# Patient Record
Sex: Female | Born: 1975 | Race: White | Hispanic: No | Smoking: Current every day smoker
Health system: Southern US, Community
[De-identification: ages and names within clinical notes are randomized; demographics above are authoritative.]

## PROBLEM LIST (undated history)

## (undated) DIAGNOSIS — E119 Type 2 diabetes mellitus without complications: Secondary | ICD-10-CM

## (undated) DIAGNOSIS — I10 Essential (primary) hypertension: Secondary | ICD-10-CM

## (undated) DIAGNOSIS — E785 Hyperlipidemia, unspecified: Secondary | ICD-10-CM

## (undated) DIAGNOSIS — T7840XA Allergy, unspecified, initial encounter: Secondary | ICD-10-CM

## (undated) DIAGNOSIS — G629 Polyneuropathy, unspecified: Secondary | ICD-10-CM

## (undated) HISTORY — DX: Essential (primary) hypertension: I10

## (undated) HISTORY — DX: Type 2 diabetes mellitus without complications: E11.9

## (undated) HISTORY — DX: Hyperlipidemia, unspecified: E78.5

## (undated) HISTORY — PX: ABDOMINAL HYSTERECTOMY: SHX81

## (undated) HISTORY — DX: Allergy, unspecified, initial encounter: T78.40XA

## (undated) HISTORY — PX: OTHER SURGICAL HISTORY: SHX169

## (undated) HISTORY — PX: DIAGNOSTIC LAPAROSCOPY: SUR761

---

## 2014-03-17 ENCOUNTER — Ambulatory Visit: Payer: Self-pay | Admitting: Obstetrics and Gynecology

## 2014-03-17 LAB — HEMOGLOBIN: HGB: 10.6 g/dL — ABNORMAL LOW (ref 12.0–16.0)

## 2014-03-17 LAB — BASIC METABOLIC PANEL
Anion Gap: 6 — ABNORMAL LOW (ref 7–16)
BUN: 8 mg/dL (ref 7–18)
CHLORIDE: 99 mmol/L (ref 98–107)
CREATININE: 0.67 mg/dL (ref 0.60–1.30)
Calcium, Total: 8.9 mg/dL (ref 8.5–10.1)
Co2: 29 mmol/L (ref 21–32)
EGFR (African American): >60
Glucose: 112 mg/dL — ABNORMAL HIGH (ref 65–99)
OSMOLALITY: 267 (ref 275–301)
Potassium: 4 mmol/L (ref 3.5–5.1)
Sodium: 134 mmol/L — ABNORMAL LOW (ref 136–145)

## 2014-03-23 ENCOUNTER — Ambulatory Visit: Payer: Self-pay | Admitting: Obstetrics and Gynecology

## 2014-03-25 LAB — PATHOLOGY REPORT

## 2014-04-22 ENCOUNTER — Ambulatory Visit: Payer: Self-pay | Admitting: Obstetrics and Gynecology

## 2014-04-22 LAB — BASIC METABOLIC PANEL
Anion Gap: 3 — ABNORMAL LOW (ref 7–16)
BUN: 6 mg/dL — ABNORMAL LOW (ref 7–18)
CALCIUM: 8.6 mg/dL (ref 8.5–10.1)
Chloride: 105 mmol/L (ref 98–107)
Co2: 30 mmol/L (ref 21–32)
Creatinine: 0.69 mg/dL (ref 0.60–1.30)
EGFR (African American): >60
EGFR (Non-African Amer.): >60
GLUCOSE: 134 mg/dL — AB (ref 65–99)
Osmolality: 275 (ref 275–301)
Potassium: 3.7 mmol/L (ref 3.5–5.1)
SODIUM: 138 mmol/L (ref 136–145)

## 2014-04-22 LAB — HEMOGLOBIN A1C: Hemoglobin A1C: 7.7 % — ABNORMAL HIGH (ref 4.2–6.3)

## 2014-04-22 LAB — HEMOGLOBIN: HGB: 10.9 g/dL — AB (ref 12.0–16.0)

## 2014-04-27 ENCOUNTER — Inpatient Hospital Stay: Payer: Self-pay | Admitting: Obstetrics and Gynecology

## 2014-04-28 LAB — CREATININE, SERUM
CREATININE: 0.84 mg/dL (ref 0.60–1.30)
EGFR (Non-African Amer.): >60

## 2014-04-28 LAB — HEMOGLOBIN: HGB: 8.9 g/dL — ABNORMAL LOW (ref 12.0–16.0)

## 2014-04-30 LAB — PATHOLOGY REPORT

## 2014-11-20 HISTORY — PX: ABDOMINAL HYSTERECTOMY: SHX81

## 2014-12-21 LAB — HM DIABETES EYE EXAM

## 2015-01-19 DEATH — deceased

## 2015-03-13 NOTE — Op Note (Signed)
PATIENT NAME:  Glendon AxeJOHNSON, Carol L MR#:  [id number] DATE OF BIRTH:  Nov 02, 1976  DATE OF PROCEDURE:  03/23/2014  PREOPERATIVE DIAGNOSES:  1. Menorrhagia.  2. Anemia.  POSTOPERATIVE DIAGNOSES:  1. Menorrhagia.  2. Anemia.  OPERATIONS: Hysteroscopy, Dilation and Curettage.  ANESTHESIA: General.   SURGEON: Dr. Hildred LaserAnika Jaedan Huttner.   ASSISTANT: Martina SinnerIsaac Stappas, [place] student.   ESTIMATED BLOOD LOSS: 25 mL.  OPERATIVE FLUIDS: 800 mL.   URINE OUTPUT: 20 mL.   COMPLICATIONS: None.   FINDINGS: Proliferative endometrium, anteverted uterine cavity.   SPECIMEN: Endometrial curettings.   PROCEDURE: The patient was taken to the operating room where she was placed under general anesthesia without difficulty. She was then prepped and draped in the normal sterile fashion. A straight catheterization was performed.  Next, a univalve speculum was then placed inside the patient's vagina. The cervix was then identified and grasped using the single-tooth tenaculum at the anterior portion of the cervix. After this, the uterine was then sounded to approximately 9 cm and dilated appropriately next, a 5-mm hysteroscope was then introduced into the uterine cavity using normal saline as the distending medium. The endometrial cavity was visualized. Both tubal ostia were identified. The lining was noted to be proliferative. No endometrial masses were observed. No perforations were noted. The hysteroscope was then removed and a sharp curettage was performed until a gritty texture was noted. Next, the NovaSure device was then inserted into the uterine cavity. Attempts to activate the NovaSure device were thwarted due to technical difficulties. The cavity assessment could not be completed although the cavity width  (3.5 cm) and length (4.5 cm) had been determined. There was good seal noted at the device; however, several attempts were made using 2 different devices and the cavity assessment could not be completed. Next, the NovaSure  device was then removed from the uterine cavity. The single-tooth tenaculum was removed from the anterior lip of the cervix. Good hemostasis as noted. The speculum was removed from the patient's vagina. The patient was taken to the recovery room in stable condition.    ____________________________ Jacques EarthlyAnika S. Valentino Saxonherry, MD asc:lt D: 03/23/2014 14:32:59 ET T: 03/24/2014 00:52:43 ET JOB#: [id number]  cc: Jacques EarthlyAnika S. Valentino Saxonherry, MD, <Dictator> Fabian NovemberANIKA S Brigg Cape MD ELECTRONICALLY SIGNED 03/25/2014 16:45

## 2015-03-13 NOTE — Op Note (Signed)
PATIENT NAME:  Carol Collins, Carol Collins MR#:  [id number] DATE OF BIRTH:  10-27-1976  DATE OF PROCEDURE:  04/27/2014  PREOPERATIVE DIAGNOSES:  1.  Menorrhagia.  2.  Severe dysmenorrhea.  3.  Dyspareunia.  4.  Hypertension. 5.  Diabetes mellitus.  6.  Obesity.  7.  Anemia.  8. History of previous C-section x 2   POSTOPERATIVE DIAGNOSES:  1.  Menorrhagia.  2.  Severe dysmenorrhea.  3.  Dyspareunia.  4.  Hypertension. 5.  Diabetes mellitus.  6.  Obesity.  7.  Anemia.  8.  History of previous C-section x 2  9.  Pelvic adhesions.    OPERATIONS: Include operative laparoscopy with lysis of adhesions and bilateral salpingectomy and total abdominal hysterectomy.   ANESTHESIA: General.   SURGEON: Hildred Laser, MD   ASSISTANT: Sharon Seller, M.D.; Alfredo Martinez, [place] student.   ESTIMATED BLOOD LOSS: 500 mL   OPERATIVE FLUIDS: 2200 mL.   URINE OUTPUT: 75 mL.   COMPLICATIONS: None.   FINDINGS: Anterior abdominal wall adhesions of the omentum, dense adhesions of lower uterine segment of the bladder. Endometrial implant was noted at the lower uterine segment anteriorly. Bilateral simple ovarian cysts, approximately 3 x 2 cm each. There was a bulky, enlarged uterus, approximately 12 weeks size.   SPECIMEN: Uterus and bilateral fallopian tubes.   PROCEDURE: The patient was taken to the Operating Room, where she was placed in the dorsal lithotomy position. She was then prepped and draped in normal sterile fashion. Next, a Foley catheter was then placed. A speculum was then placed into the vagina. A single-tooth tenaculum was utilized to grasp the anterior lip of the uterine cervix. Uterus was sounded to approximately 8 cm. A Hulka clamp was then inserted for uterine manipulation. The single-tooth tenaculum and speculum were then removed from the patient's vagina.   At this time, attention was turned to the abdomen, where an infraumbilical skin incision was made vertically. Next, a 5 mm trocar and  sheath with the laparoscope were inserted into the abdominal cavity. This was done under direct visualization. After entry into the abdominal cavity was confirmed, the abdomen was then insufflated with CO2 gas. Next, the abdomen was evaluated and noted to have abdominal adhesions of the omentum to the anterior abdominal wall. All of the findings were as noted above. Two 5 mm port sites were then created under direct visualization just lateral to the rectus abdominus muscles with great care not to injure any vessels. Next, the grasper was used to manipulate the bowel away from the operative site.   At this time, the right cornu was grasped and the right fallopian tube was transected and ligated from the right ovary using the Harmonic device.  The utero-ovarian ligmaent was then transected and ligated using the Harmonic device.  A similar procedure was then carried out on the left for the fallopian tube and ovary.  Next, the round ligaments on each sider were transected and ligated. The ureters were noted to be deep in the pelvis.  The remainder of the uterine vessels and anterior and posterior leaves of the broad ligament were coagulated and transected in a serial fashion.  The anterior leaf of the broad ligament was then attempted to be dissected however dense adhesions were noted of the bladder to the lower uterine segment. The decision was made to complete the procedure vaginally. The laparoscope was then removed from the abdomen  Attention was then turned to the vagina where a weighted speculum was placed.  A  Deaver was used for retraction of the anterior vaginal wall.  A double-tooth tenaculum was placed on the cervix and manipulation was attempted.  The uterus was noted to still have no descensus.  Due to lack of descensus likely caused by dense bladder adhesions, the decision was made to convert to laparotomy.   Attention was then redirected to the patient's abdomen.  The abdomen was deflated and the  trocars were removed.  Next a infraumbilical midline incision was made going through the patient's prior scar with the scalpel.  Then inicision was carried down to the level of the fascia using the Bovie.  The fascia was incised and the incision was extended longitudinally.  The rectus muscle on the right was dissected off the fascia.  The peritoneum was identified and entered and the incision was extended longitudinally.    The above findings were again observed.  A Balfour retractor was placed and the bowel was packed away from the surgical site.   The uterine arteries were then clamped, cut, and suture-ligated with sutures of 0 Vicryl bilaterally. The anterior peritoneal reflection was then carefully dissected off the lower uterine segment with lysis of adhesions performed.  After this, serial pedicles of the cardinal and utero-sacral ligaments were clamped, cut, and suture ligated with 0 Vicryl.  The uterus was amputated at the level of the internal cervical os to allow for better visualization.  The remaining pedicles of the utero-sacral ligaments were clamped, cut, and suture ligated with 0 Vicryl.  Entrance was made into the vagina and the cervical stump was removed.  Vaginal cuff angle sutures were placed incorporating the utero-scaral ligmanets for support.  The vaginal cuff was then closed with figure-of-eight sutures of 0 Vicryl.  Lavage was carried out until clear.  Hemostasis was observed.   Attention was turned once more to pedicles of the round and utero-ovarian ligaments, where hemostasis was observed. The retractor and all packing was removed from the abdomen.  The fascia was approximated using a running suture of 0 Maxon.  Lavage was again carried out.  The subcutaneous fat layer was approximated using 2-0 Vicryl.  The skin was approximated with staples. The laparoscopic port sites were closed using Dermabond.  A lidoderm patch was placed over the midline incision.   Instrument, sponge, and  needle counts were correct prior to abdominal closure and at the conclusion of the case.  The patient was awakened from anesthesia and taken to the recovery room in stable condition.   ____________________________ Jacques EarthlyAnika S. Valentino Saxonherry, MD asc:cg D: 04/27/2014 19:13:00 ET T: 04/28/2014 02:36:12 ET JOB#: [id number]  cc: Jacques EarthlyAnika S. Valentino Saxonherry, MD, <Dictator> Fabian NovemberANIKA S Yida Hyams MD ELECTRONICALLY SIGNED 05/04/2014 15:08

## 2015-05-13 ENCOUNTER — Ambulatory Visit: Payer: Self-pay

## 2015-05-20 ENCOUNTER — Other Ambulatory Visit: Payer: Self-pay

## 2015-05-20 LAB — BASIC METABOLIC PANEL
CREATININE: 0.7 mg/dL (ref 0.5–1.1)
GLUCOSE: 92 mg/dL

## 2015-05-20 LAB — LIPID PANEL
Cholesterol: 185 mg/dL (ref 0–200)
HDL: 44 mg/dL (ref 35–70)
LDL CALC: 95 mg/dL

## 2015-05-20 LAB — HEPATIC FUNCTION PANEL
Bilirubin, Direct: 0.17 mg/dL (ref 0.01–0.4)
Bilirubin, Total: 0.6 mg/dL

## 2015-05-20 LAB — CBC AND DIFFERENTIAL
HCT: 36 % (ref 36–46)
Hemoglobin: 12.2 g/dL (ref 12.0–16.0)
Neutrophils Absolute: 7 /uL
Platelets: 206 10*3/uL (ref 150–399)
WBC: 10.9 10*3/mL

## 2015-05-20 LAB — TSH: TSH: 1.22 u[IU]/mL (ref 0.41–5.90)

## 2015-05-21 LAB — HEMOGLOBIN A1C: HEMOGLOBIN A1C: 6.9 % — AB (ref 4.0–6.0)

## 2015-06-08 ENCOUNTER — Ambulatory Visit: Payer: Self-pay

## 2015-06-09 ENCOUNTER — Encounter (INDEPENDENT_AMBULATORY_CARE_PROVIDER_SITE_OTHER): Payer: Self-pay

## 2015-06-09 ENCOUNTER — Ambulatory Visit: Payer: Self-pay

## 2015-06-10 ENCOUNTER — Ambulatory Visit: Payer: Self-pay

## 2015-06-25 ENCOUNTER — Ambulatory Visit (INDEPENDENT_AMBULATORY_CARE_PROVIDER_SITE_OTHER): Payer: Self-pay | Admitting: Family Medicine

## 2015-06-25 ENCOUNTER — Encounter: Payer: Self-pay | Admitting: Family Medicine

## 2015-06-25 ENCOUNTER — Ambulatory Visit
Admission: RE | Admit: 2015-06-25 | Discharge: 2015-06-25 | Disposition: A | Discharge status: Home / Self Care | Payer: Self-pay | Source: Ambulatory Visit | Attending: Nurse Practitioner | Admitting: Nurse Practitioner

## 2015-06-25 VITALS — BP 113/78 | HR 88 | Temp 98.2°F | Resp 16 | Ht 60.0 in | Wt 223.8 lb

## 2015-06-25 DIAGNOSIS — R42 Dizziness and giddiness: Secondary | ICD-10-CM | POA: Insufficient documentation

## 2015-06-25 DIAGNOSIS — R110 Nausea: Secondary | ICD-10-CM | POA: Insufficient documentation

## 2015-06-25 DIAGNOSIS — I10 Essential (primary) hypertension: Secondary | ICD-10-CM | POA: Insufficient documentation

## 2015-06-25 DIAGNOSIS — F17200 Nicotine dependence, unspecified, uncomplicated: Secondary | ICD-10-CM | POA: Insufficient documentation

## 2015-06-25 DIAGNOSIS — E119 Type 2 diabetes mellitus without complications: Secondary | ICD-10-CM | POA: Insufficient documentation

## 2015-06-25 DIAGNOSIS — E669 Obesity, unspecified: Secondary | ICD-10-CM | POA: Insufficient documentation

## 2015-06-25 NOTE — Patient Instructions (Signed)
Your symptoms could be vasovagal in nature.  They could also be related to hydration status as the time your feel faint. Please continue your work-up with the [facility].

## 2015-06-25 NOTE — Progress Notes (Signed)
Subjective:    Patient ID: Carol Collins, female    DOB: Jun 08, 1976, 39 y.o.   MRN: [id number]  HPI: Carol Collins is a 39 y.o. female presenting on 06/25/2015 for Employment Physical   HPI  Pt presents for FMLA paperwork for her job. She is currently being [facility] for dizziness, nausea, and sweating. She has a history of DM but sugars have been normal. She is having to leave work early 1-2 times per month. Episodes occur 1-2 per month. Episodes last 1-2 hours- she feels well by the evening. Episodes do not include chest pain, palpitations, no shortness of breath. She will have an EKG to work up her symptoms. Symptoms been occurring for about 1 year. She has been treated for the past 2 mos.               Past Medical History  Diagnosis Date  . Hypertension   . Diabetes mellitus without complication     No current outpatient prescriptions on file prior to visit.   No current facility-administered medications on file prior to visit.    Review of Systems  Constitutional: Positive for diaphoresis (when episodes occur).  HENT: Negative.   Respiratory: Negative for chest tightness, shortness of breath and wheezing.   Cardiovascular: Negative for chest pain, palpitations and leg swelling.  Gastrointestinal: Positive for nausea (when episodes occur).  Endocrine: Negative for polydipsia, polyphagia and polyuria.  Genitourinary: Negative.   Musculoskeletal: Negative.   Skin: Negative for color change, pallor and rash.  Neurological: Positive for light-headedness (when episodes occur).  Psychiatric/Behavioral: Negative.    Per HPI unless specifically indicated above     Objective:    BP 113/78 mmHg  Pulse 88  Temp(Src) 98.2 F (36.8 C) (Oral)  Resp 16  Ht 5' (1.524 m)  Wt 223 lb 12.8 oz (101.515 kg)  BMI 43.71 kg/m2  LMP   Wt Readings from Last 3 Encounters:  06/25/15 223 lb 12.8 oz (101.515 kg)    Physical Exam  Constitutional: She is oriented to  person, place, and time. She appears well-developed and well-nourished. No distress.  Neck: Normal range of motion. Neck supple. No thyromegaly present.  Cardiovascular: Normal rate and regular rhythm.  Exam reveals no gallop and no friction rub.   No murmur heard. Pulmonary/Chest: Effort normal and breath sounds normal. She has no wheezes. She exhibits no tenderness.  Neurological: She is alert and oriented to person, place, and time. She has normal reflexes.  Skin: Skin is warm and dry. She is not diaphoretic. No erythema. No pallor.                        Assessment & Plan:   Problem List Items Addressed This Visit    None    Visit Diagnoses    Episodic lightheadedness    -  Primary    FMLA paper work completed. Pt will continue her follow-up [facility] for [facility]. EKG today. Possible vasovagal response.        Meds ordered this encounter  Medications  . hydrochlorothiazide (HYDRODIURIL) 25 MG tablet    Sig: Take 25 mg by mouth daily.  Marland Kitchen lisinopril (PRINIVIL,ZESTRIL) 10 MG tablet    Sig: Take 10 mg by mouth daily.  . metFORMIN (GLUMETZA) 1000 MG (MOD) 24 hr tablet    Sig: Take 1,000 mg by mouth 2 (two) times daily.  . fluconazole (DIFLUCAN) 10 MG/ML  suspension    Sig: Take by mouth daily.  . fluticasone (FLONASE) 50 MCG/ACT nasal spray    Sig: Place into both nostrils daily.      Follow up plan: Return if symptoms worsen or fail to improve.

## 2015-09-16 ENCOUNTER — Other Ambulatory Visit: Payer: Self-pay

## 2015-09-21 ENCOUNTER — Other Ambulatory Visit: Payer: Self-pay

## 2015-09-23 ENCOUNTER — Ambulatory Visit: Payer: Self-pay

## 2015-09-28 ENCOUNTER — Ambulatory Visit: Payer: Self-pay

## 2015-10-08 ENCOUNTER — Encounter: Payer: Self-pay | Admitting: Family Medicine

## 2015-10-08 ENCOUNTER — Ambulatory Visit (INDEPENDENT_AMBULATORY_CARE_PROVIDER_SITE_OTHER): Payer: Medicaid Other | Admitting: Family Medicine

## 2015-10-08 VITALS — BP 108/72 | HR 90 | Temp 98.0°F | Resp 16 | Ht 61.0 in | Wt 230.2 lb

## 2015-10-08 DIAGNOSIS — F17200 Nicotine dependence, unspecified, uncomplicated: Secondary | ICD-10-CM

## 2015-10-08 DIAGNOSIS — Z23 Encounter for immunization: Secondary | ICD-10-CM | POA: Diagnosis not present

## 2015-10-08 DIAGNOSIS — B373 Candidiasis of vulva and vagina: Secondary | ICD-10-CM | POA: Diagnosis not present

## 2015-10-08 DIAGNOSIS — J019 Acute sinusitis, unspecified: Secondary | ICD-10-CM | POA: Insufficient documentation

## 2015-10-08 DIAGNOSIS — M791 Myalgia, unspecified site: Secondary | ICD-10-CM | POA: Insufficient documentation

## 2015-10-08 DIAGNOSIS — D509 Iron deficiency anemia, unspecified: Secondary | ICD-10-CM | POA: Insufficient documentation

## 2015-10-08 DIAGNOSIS — Z720 Tobacco use: Secondary | ICD-10-CM | POA: Diagnosis not present

## 2015-10-08 DIAGNOSIS — I10 Essential (primary) hypertension: Secondary | ICD-10-CM

## 2015-10-08 DIAGNOSIS — E1142 Type 2 diabetes mellitus with diabetic polyneuropathy: Secondary | ICD-10-CM | POA: Insufficient documentation

## 2015-10-08 DIAGNOSIS — J0100 Acute maxillary sinusitis, unspecified: Secondary | ICD-10-CM | POA: Insufficient documentation

## 2015-10-08 DIAGNOSIS — R203 Hyperesthesia: Secondary | ICD-10-CM | POA: Insufficient documentation

## 2015-10-08 DIAGNOSIS — J302 Other seasonal allergic rhinitis: Secondary | ICD-10-CM | POA: Insufficient documentation

## 2015-10-08 DIAGNOSIS — J309 Allergic rhinitis, unspecified: Secondary | ICD-10-CM | POA: Insufficient documentation

## 2015-10-08 DIAGNOSIS — J301 Allergic rhinitis due to pollen: Secondary | ICD-10-CM

## 2015-10-08 DIAGNOSIS — B3731 Acute candidiasis of vulva and vagina: Secondary | ICD-10-CM

## 2015-10-08 DIAGNOSIS — N920 Excessive and frequent menstruation with regular cycle: Secondary | ICD-10-CM | POA: Insufficient documentation

## 2015-10-08 DIAGNOSIS — IMO0001 Reserved for inherently not codable concepts without codable children: Secondary | ICD-10-CM | POA: Insufficient documentation

## 2015-10-08 DIAGNOSIS — G114 Hereditary spastic paraplegia: Secondary | ICD-10-CM | POA: Insufficient documentation

## 2015-10-08 LAB — POCT GLYCOSYLATED HEMOGLOBIN (HGB A1C): HEMOGLOBIN A1C: 6.8

## 2015-10-08 LAB — MICROALBUMIN, URINE: Microalb, Ur: NEGATIVE

## 2015-10-08 MED ORDER — ALPHA-LIPOIC ACID 600 MG PO CAPS: 1.0000 | ORAL_CAPSULE | Freq: Every day | ORAL | Status: DC

## 2015-10-08 MED ORDER — FLUCONAZOLE 150 MG PO TABS: 150.0000 mg | ORAL_TABLET | Freq: Once | ORAL | Status: DC

## 2015-10-08 MED ORDER — METFORMIN HCL 1000 MG PO TABS: 1000.0000 mg | ORAL_TABLET | Freq: Two times a day (BID) | ORAL | Status: DC

## 2015-10-08 MED ORDER — LISINOPRIL 10 MG PO TABS
10.0000 mg | ORAL_TABLET | Freq: Every day | ORAL | Status: DC
Start: 2015-10-08 — End: 2016-07-13

## 2015-10-08 MED ORDER — FLUTICASONE PROPIONATE 50 MCG/ACT NA SUSP: 2.0000 | Freq: Every day | NASAL | Status: DC

## 2015-10-08 MED ORDER — METFORMIN HCL ER (MOD) 1000 MG PO TB24: 1000.0000 mg | ORAL_TABLET | Freq: Two times a day (BID) | ORAL | Status: DC

## 2015-10-08 NOTE — Assessment & Plan Note (Signed)
Pt encouraged to quit smoking.  Referred to [facility]. Not ready at this time.

## 2015-10-08 NOTE — Assessment & Plan Note (Signed)
Renew flonase. Recommend daily antihistamine and neti pot.

## 2015-10-08 NOTE — Assessment & Plan Note (Addendum)
A1c 6.8%. Stable. Continue metformin 1000mg  BID. Encouraged continued diet and lifestyle changes.  Alpha lipoic acid for neuropathy. Continue capsaicin cream.  Eye exam: Due 12/2015 Foot exam done today. ACE for renal protection. Not on statin. Check lipid panel. RTC 3 mos.

## 2015-10-08 NOTE — Progress Notes (Signed)
Subjective:    Patient ID: Carol Collins, female    DOB: [birth year], 39 y.o.   MRN: [id number]  HPI: Carol Collins is a 39 y.o. female presenting on 10/08/2015 for Diabetes; Hyperlipidemia; and Hypertension   Diabetes She presents for her follow-up diabetic visit. She has type 2 diabetes mellitus. Hypoglycemia symptoms include dizziness and hunger. Pertinent negatives for hypoglycemia include no headaches. Associated symptoms include foot paresthesias. Pertinent negatives for diabetes include no blurred vision, no chest pain, no polydipsia, no polyphagia, no polyuria, no weakness and no weight loss. There are no hypoglycemic complications. Current diabetic treatment includes diet and oral agent (monotherapy). She participates in exercise intermittently. Her overall blood glucose range is 110-130 mg/dl. An ACE inhibitor/angiotensin II receptor blocker is being taken. She does not see a podiatrist.Eye exam is current.  Hyperlipidemia Pertinent negatives include no chest pain or shortness of breath.  Hypertension Pertinent negatives include no blurred vision, chest pain, headaches, palpitations or shortness of breath.    Pt presents for diabetes follow-up. Previously managed at [facility]. Last A1c was 6.9%. Taking Metformin  twice daily. No formal exercise program- stays very active in her job. Occasional hypoglycemia when she does not eat the right meals. Pt does report numbness and tingling in her feet. SHe is using capascin lotion to help with her feet.  Hypertension: Taking lisinopril  And HCTZ. Pt feels the HCTZ is making her dizzy. She has noticed when she takes HCTZ she feels dizzy. Pt reports no HA or visual changes. No chest pain.   Pt requesting renewal of flonase for allergic rhinitis symptoms. Daily runny nose and itching. Worse in the fall.  Flonase helps to control symptoms.   Past Medical History  Diagnosis Date  . Hypertension   . Diabetes mellitus without complication  [facility])     Current Outpatient Prescriptions on File Prior to Visit  Medication Sig  . hydrochlorothiazide (HYDRODIURIL) 25 MG tablet Take 25 mg by mouth daily.   No current facility-administered medications on file prior to visit.    Review of Systems  Constitutional: Negative for fever, chills and weight loss.  HENT: Negative.   Eyes: Negative for blurred vision, photophobia and visual disturbance.  Respiratory: Negative for chest tightness and shortness of breath.   Cardiovascular: Negative for chest pain, palpitations and leg swelling.  Gastrointestinal: Negative for abdominal pain and abdominal distention.  Endocrine: Negative for cold intolerance, heat intolerance, polydipsia, polyphagia and polyuria.  Genitourinary: Negative.   Musculoskeletal: Negative.   Neurological: Positive for dizziness and numbness (bilateral feet. ). Negative for weakness and headaches.  Psychiatric/Behavioral: Negative.    Per HPI unless specifically indicated above     Objective:    BP 108/72 mmHg  Pulse 90  Temp(Src) 98 F (36.7 C) (Oral)  Resp 16  Ht  (1.549 m)  Wt 230 lb 3.2 oz (104.418 kg)  BMI 43.52 kg/m2  LMP 11/20/2013  Wt Readings from Last 3 Encounters:  10/08/15 230 lb 3.2 oz (104.418 kg)  06/25/15 223 lb 12.8 oz (101.515 kg)    Physical Exam  Constitutional: She is oriented to person, place, and time. She appears well-developed and well-nourished. No distress.  Neck: Normal range of motion. Neck supple. No thyromegaly present.  Cardiovascular: Normal rate and regular rhythm.  Exam reveals no gallop and no friction rub.   No murmur heard. Pulmonary/Chest: Effort normal and breath sounds normal.  Abdominal: Soft. Bowel sounds are normal. There is no tenderness. There is no  rebound.  Musculoskeletal: Normal range of motion. She exhibits no edema or tenderness.  Lymphadenopathy:    She has no cervical adenopathy.  Neurological: She is alert and oriented to person, place,  and time.  Skin: Skin is warm and dry. She is not diaphoretic.   Diabetic Foot Exam - Simple   Simple Foot Form  Diabetic Foot exam was performed with the following findings:  Yes 10/08/2015  9:44 AM  Visual Inspection  Sensation Testing  Pulse Check  Comments  Hypersensitive to touch and pain on bilateral feet.           Assessment & Plan:   Problem List Items Addressed This Visit      Cardiovascular and Mediastinum   BP (high blood pressure)    Controlled.  Stop HCTZ for 1 month to see if dizziness symptoms resolve. Encouraged DASH diet and increased exercise. Encouraged smoking cessation to control BP. ACE for renal protection.  Check CMP.  RTC 3 mos.       Relevant Medications   lisinopril (PRINIVIL,ZESTRIL) 10 MG tablet     Respiratory   Allergic rhinitis due to pollen    Renew flonase. Recommend daily antihistamine and neti pot.       Relevant Medications   fluticasone (FLONASE) 50 MCG/ACT nasal spray     Endocrine   Diabetes (HCC)    A1c 6.8%. Stable. Continue metformin 1000mg  BID. Encouraged continued diet and lifestyle changes.  Alpha lipoic acid for neuropathy. Continue capsaicin cream.  Eye exam: Due 12/2015 Foot exam done today. ACE for renal protection. Not on statin. Check lipid panel. RTC 3 mos.        Relevant Medications   Alpha-Lipoic Acid 600 MG CAPS   lisinopril (PRINIVIL,ZESTRIL) 10 MG tablet   metFORMIN (GLUCOPHAGE) 1000 MG tablet   Other Relevant Orders   POCT HgB A1C (Completed)   Lipid panel   Comprehensive metabolic panel   [facility] DIABETES EYE EXAM (Completed)    Other Visit Diagnoses    Vaginal candida        Home treatment not working. Treat with diflucan orally. RTC if not resolved.     Relevant Medications    fluconazole (DIFLUCAN) 150 MG tablet    Need for influenza vaccination        Relevant Orders    Flu Vaccine QUAD 36+ mos PF IM (Fluarix & Fluzone Quad PF) (Completed)       Meds ordered this encounter  Medications    . Melatonin 5 MG CAPS    Sig: Take by mouth. Takes 4 night a week  . fluconazole (DIFLUCAN) 150 MG tablet    Sig: Take 1 tablet (150 mg total) by mouth once.    Dispense:  1 tablet    Refill:  0    Order Specific Question:  Supervising Provider    Answer:  Janeann ForehandHAWKINS JR, JAMES H [[id number]]  . Alpha-Lipoic Acid 600 MG CAPS    Sig: Take 1 capsule (600 mg total) by mouth daily.    Dispense:  30 each    Refill:  11    Order Specific Question:  Supervising Provider    Answer:  Janeann ForehandHAWKINS JR, JAMES H [phone][[id number]]  . lisinopril (PRINIVIL,ZESTRIL) 10 MG tablet    Sig: Take 1 tablet (10 mg total) by mouth daily.    Dispense:  30 tablet    Refill:  11    Order Specific Question:  Supervising Provider    Answer:  Janeann ForehandHAWKINS JR, JAMES H [phone]]  .  fluticasone (FLONASE) 50 MCG/ACT nasal spray    Sig: Place 2 sprays into both nostrils daily.    Dispense:  16 g    Refill:  11    Order Specific Question:  Supervising Provider    Answer:  Janeann Forehand [phone]  . DISCONTD: metFORMIN (GLUMETZA) 1000 MG (MOD) 24 hr tablet    Sig: Take 1 tablet (1,000 mg total) by mouth 2 (two) times daily with a meal.    Dispense:  60 tablet    Refill:  11    Order Specific Question:  Supervising Provider    Answer:  Janeann Forehand [phone]  . metFORMIN (GLUCOPHAGE) 1000 MG tablet    Sig: Take 1 tablet (1,000 mg total) by mouth 2 (two) times daily with a meal.    Dispense:  60 tablet    Refill:  11    Order Specific Question:  Supervising Provider    Answer:  Janeann Forehand [phone]      Follow up plan: Return in about 1 month (around 11/07/2015).

## 2015-10-08 NOTE — Patient Instructions (Signed)
Your goal blood pressure is 140/90. Work on low salt/sodium diet - goal <2.5gm (2,500mg ) per day. Eat a diet high in fruits/vegetables and whole grains.  Look into mediterranean and DASH diet. Goal activity is 16250min/wk of moderate intensity exercise.  This can be split into 30 minute chunks.  If you are not at this level, you can start with smaller 10-15 min increments and slowly build up activity. Look at www.heart.org for more resources   Please check your blood glucose 3 times weekly. If your glucose is < 70 mg/dl or you have symptoms of hypoglycemia confusion, dizziness, headache, hunger, jitteriness and sweating please drink 4 oz of juice or soda.  Check blood glucose 15 minutes later. If it has not risen to >100, please seek medical attention. If > 100 please eat a snack containing protein such as peanut butter and crackers.

## 2015-10-08 NOTE — Assessment & Plan Note (Signed)
Controlled.  Stop HCTZ for 1 month to see if dizziness symptoms resolve. Encouraged DASH diet and increased exercise. Encouraged smoking cessation to control BP. ACE for renal protection.  Check CMP.  RTC 3 mos.

## 2015-11-08 ENCOUNTER — Ambulatory Visit: Payer: Medicaid Other | Admitting: Family Medicine

## 2015-11-09 ENCOUNTER — Ambulatory Visit (INDEPENDENT_AMBULATORY_CARE_PROVIDER_SITE_OTHER): Payer: Medicaid Other | Admitting: Family Medicine

## 2015-11-09 VITALS — BP 122/80 | HR 83 | Temp 97.7°F | Resp 16 | Ht 61.0 in | Wt 230.0 lb

## 2015-11-09 DIAGNOSIS — I10 Essential (primary) hypertension: Secondary | ICD-10-CM

## 2015-11-09 DIAGNOSIS — R600 Localized edema: Secondary | ICD-10-CM | POA: Insufficient documentation

## 2015-11-09 MED ORDER — FUROSEMIDE 20 MG PO TABS: ORAL_TABLET | ORAL | Status: DC

## 2015-11-09 NOTE — Assessment & Plan Note (Signed)
BP controlled but pt taking both diuretic and ACE.  Stop HCTZ due to dizziness at work and possible too low BP. Continue with lisinopril for BP control. CMP to check kidney function. ACE for renal protection.

## 2015-11-09 NOTE — Progress Notes (Signed)
Subjective:    Patient ID: Carol Collins, female    DOB: 07/13/1976, 39 y.o.   MRN: [id number]  HPI: Carol Collins is a 39 y.o. female presenting on 11/09/2015 for Hypertension   HPI  Pt presents for blood pressure follow-up. Taking lisinopril once daily for BP. She had stopped the HCTZ due to dizziness. She thought it was dehydration related at work. The swelling increased and she restarted HCTZ. She feels dizzy from her constant up and down at work when she takes HCTZ. Checks BP at work but it is unreliable. Overall feeling well, denies CP, SOB, visual changes, or HA.   Pedal edema: Occurs daily when not taking HCTZ. Swelling in bilateral legs and hands. No SOB or facial swelling. Reduces with foot elevation.  Reduces with hydration.    Past Medical History  Diagnosis Date  . Hypertension   . Diabetes mellitus without complication [facility])     Current Outpatient Prescriptions on File Prior to Visit  Medication Sig  . Alpha-Lipoic Acid 600 MG CAPS Take 1 capsule (600 mg total) by mouth daily.  . fluconazole (DIFLUCAN) 150 MG tablet Take 1 tablet (150 mg total) by mouth once.  . fluticasone (FLONASE) 50 MCG/ACT nasal spray Place 2 sprays into both nostrils daily.  Marland Kitchen lisinopril (PRINIVIL,ZESTRIL) 10 MG tablet Take 1 tablet (10 mg total) by mouth daily.  . Melatonin 5 MG CAPS Take by mouth. Takes 4 night a week  . metFORMIN (GLUCOPHAGE) 1000 MG tablet Take 1 tablet (1,000 mg total) by mouth 2 (two) times daily with a meal.   No current facility-administered medications on file prior to visit.    Review of Systems  Constitutional: Negative for fever and chills.  HENT: Negative.   Respiratory: Negative for cough, chest tightness and wheezing.   Cardiovascular: Negative for chest pain and leg swelling.  Gastrointestinal: Negative for nausea, vomiting, abdominal pain, diarrhea and constipation.  Endocrine: Negative.  Negative for cold intolerance, heat intolerance, polydipsia,  polyphagia and polyuria.  Genitourinary: Negative for dysuria and difficulty urinating.  Musculoskeletal: Negative.   Neurological: Negative for dizziness, light-headedness and numbness.  Psychiatric/Behavioral: Negative.    Per HPI unless specifically indicated above     Objective:    BP 122/80 mmHg  Pulse 83  Temp(Src) 97.7 F (36.5 C) (Oral)  Resp 16  Ht  (1.549 m)  Wt 230 lb (104.327 kg)  BMI 43.48 kg/m2  Wt Readings from Last 3 Encounters:  11/09/15 230 lb (104.327 kg)  10/08/15 230 lb 3.2 oz (104.418 kg)  06/25/15 223 lb 12.8 oz (101.515 kg)    Physical Exam  Constitutional: She is oriented to person, place, and time. She appears well-developed and well-nourished.  HENT:  Head: Normocephalic and atraumatic.  Neck: Neck supple.  Cardiovascular: Normal rate, regular rhythm and normal heart sounds.  Exam reveals no gallop and no friction rub.   No murmur heard. Pulmonary/Chest: Effort normal and breath sounds normal. She has no wheezes. She exhibits no tenderness.  Abdominal: Soft. Normal appearance and bowel sounds are normal. She exhibits no distension and no mass. There is no tenderness. There is no rebound and no guarding.  Musculoskeletal: Normal range of motion. She exhibits no edema or tenderness.  Lymphadenopathy:    She has no cervical adenopathy.  Neurological: She is alert and oriented to person, place, and time.  Skin: Skin is warm and dry.   Results for orders placed or performed in visit on 10/08/15  Hemoglobin A1c  Result Value Ref Range   Hgb A1c MFr Bld 6.9 (A) 4.0 - 6.0 %  Microalbumin, urine  Result Value Ref Range   Microalb, Ur negative   POCT HgB A1C  Result Value Ref Range   Hemoglobin A1C 6.8   HM DIABETES EYE EXAM  Result Value Ref Range   HM Diabetic Eye Exam No Retinopathy No Retinopathy      Assessment & Plan:   Problem List Items Addressed This Visit      Cardiovascular and Mediastinum   BP (high blood pressure) - Primary     BP controlled but pt taking both diuretic and ACE.  Stop HCTZ due to dizziness at work and possible too low BP. Continue with lisinopril for BP control. CMP to check kidney function. ACE for renal protection.       Relevant Medications   furosemide (LASIX) 20 MG tablet     Other   Pedal edema    Trial of lasix for swelling to reduce dizziness caused by HCTZ.  1/2 tablet of lasix for swelling daily as needed,  May increase to whole tablet.  Check CMP. Encouraged elevating feet and maintaining adequate hydration.       Relevant Medications   furosemide (LASIX) 20 MG tablet      Meds ordered this encounter  Medications  . furosemide (LASIX) 20 MG tablet    Sig: Take 1/2 tab daily as needed for swelling. May take a full tablet if needed.    Dispense:  30 tablet    Refill:  5    Order Specific Question:  Supervising Provider    Answer:  Janeann ForehandHAWKINS JR, JAMES H [[id number]]      Follow up plan: Return in about 6 weeks (around 12/21/2015).

## 2015-11-09 NOTE — Patient Instructions (Signed)
Your goal blood pressure is 140/90. Work on low salt/sodium diet - goal <2.5gm (2,500mg) per day. Eat a diet high in fruits/vegetables and whole grains.  Look into mediterranean and DASH diet. Goal activity is 150min/wk of moderate intensity exercise.  This can be split into 30 minute chunks.  If you are not at this level, you can start with smaller 10-15 min increments and slowly build up activity. Look at www.heart.org for more resources  Please seek immediate medical attention at ER or Urgent Care if you develop: Chest pain, pressure or tightness. Shortness of breath accompanied by nausea or diaphoresis Visual changes Numbness or tingling on one side of the body Facial droop Altered mental status Or any concerning symptoms.  

## 2015-11-09 NOTE — Assessment & Plan Note (Addendum)
Trial of lasix for swelling to reduce dizziness caused by HCTZ.  1/2 tablet of lasix for swelling daily as needed,  May increase to whole tablet.  Check CMP. Encouraged elevating feet and maintaining adequate hydration.

## 2015-11-11 LAB — COMPREHENSIVE METABOLIC PANEL
ALBUMIN: 3.9 g/dL (ref 3.5–5.5)
ALK PHOS: 60 IU/L (ref 39–117)
ALT: 26 IU/L (ref 0–32)
AST: 20 IU/L (ref 0–40)
Albumin/Globulin Ratio: 1.9 (ref 1.1–2.5)
BUN/Creatinine Ratio: 15 (ref 9–23)
BUN: 13 mg/dL (ref 6–24)
Bilirubin Total: 0.3 mg/dL (ref 0.0–1.2)
CO2: 21 mmol/L (ref 18–29)
Calcium: 8.9 mg/dL (ref 8.7–10.2)
Chloride: 95 mmol/L — ABNORMAL LOW (ref 96–106)
Creatinine, Ser: 0.87 mg/dL (ref 0.57–1.00)
GFR calc Af Amer: 96 mL/min/{1.73_m2} (ref >59)
GFR, EST NON AFRICAN AMERICAN: 84 mL/min/{1.73_m2} (ref >59)
Globulin, Total: 2.1 g/dL (ref 1.5–4.5)
Glucose: 252 mg/dL — ABNORMAL HIGH (ref 65–99)
POTASSIUM: 3.7 mmol/L (ref 3.5–5.2)
Sodium: 135 mmol/L (ref 134–144)
Total Protein: 6 g/dL (ref 6.0–8.5)

## 2015-11-11 LAB — LIPID PANEL
CHOLESTEROL TOTAL: 165 mg/dL (ref 100–199)
Chol/HDL Ratio: 3.8 ratio units (ref 0.0–4.4)
HDL: 43 mg/dL (ref >39)
LDL Calculated: 78 mg/dL (ref 0–99)
Triglycerides: 218 mg/dL — ABNORMAL HIGH (ref 0–149)
VLDL CHOLESTEROL CAL: 44 mg/dL — AB (ref 5–40)

## 2015-11-25 ENCOUNTER — Ambulatory Visit (INDEPENDENT_AMBULATORY_CARE_PROVIDER_SITE_OTHER): Payer: Medicaid Other | Admitting: Family Medicine

## 2015-11-25 ENCOUNTER — Encounter: Payer: Self-pay | Admitting: Family Medicine

## 2015-11-25 VITALS — BP 130/78 | HR 88 | Temp 97.8°F | Resp 16 | Ht 61.0 in | Wt 232.0 lb

## 2015-11-25 DIAGNOSIS — R600 Localized edema: Secondary | ICD-10-CM

## 2015-11-25 DIAGNOSIS — N3001 Acute cystitis with hematuria: Secondary | ICD-10-CM

## 2015-11-25 DIAGNOSIS — B3731 Acute candidiasis of vulva and vagina: Secondary | ICD-10-CM

## 2015-11-25 DIAGNOSIS — B373 Candidiasis of vulva and vagina: Secondary | ICD-10-CM | POA: Diagnosis not present

## 2015-11-25 LAB — POCT URINALYSIS DIPSTICK
Bilirubin, UA: NEGATIVE
Glucose, UA: NEGATIVE
Ketones, UA: NEGATIVE
NITRITE UA: POSITIVE
PROTEIN UA: 2000
UROBILINOGEN UA: 0.2
pH, UA: 5

## 2015-11-25 MED ORDER — PHENAZOPYRIDINE HCL 100 MG PO TABS: 100.0000 mg | ORAL_TABLET | Freq: Three times a day (TID) | ORAL | Status: DC | PRN

## 2015-11-25 MED ORDER — NITROFURANTOIN MONOHYD MACRO 100 MG PO CAPS: 100.0000 mg | ORAL_CAPSULE | Freq: Two times a day (BID) | ORAL | Status: DC

## 2015-11-25 MED ORDER — FLUCONAZOLE 150 MG PO TABS: 150.0000 mg | ORAL_TABLET | Freq: Once | ORAL | Status: DC

## 2015-11-25 MED ORDER — HYDROCHLOROTHIAZIDE 25 MG PO TABS: 25.0000 mg | ORAL_TABLET | Freq: Every day | ORAL | Status: DC

## 2015-11-25 NOTE — Patient Instructions (Signed)

## 2015-11-25 NOTE — Assessment & Plan Note (Signed)
Need renewal of PRN HCTZ.

## 2015-11-25 NOTE — Progress Notes (Signed)
Subjective:    Patient ID: Carol Collins, female    DOB: 04/03/1976, 40 y.o.   MRN: [id number]  HPI: Carol Collins is a 40 y.o. female presenting on 11/25/2015 for Urinary Tract Infection   HPI  Symptoms began today. Urgency, frequency, very little urine. Blood in the urine. No fevers. No nausea/vomiting. No pelvic pressure or flank pain. Pt requesting PRN fluconazole for frequent yeast infections with UTI.     Past Medical History  Diagnosis Date  . Hypertension   . Diabetes mellitus without complication [facility](HCC)     Current Outpatient Prescriptions on File Prior to Visit  Medication Sig  . Alpha-Lipoic Acid 600 MG CAPS Take 1 capsule (600 mg total) by mouth daily.  . fluticasone (FLONASE) 50 MCG/ACT nasal spray Place 2 sprays into both nostrils daily.  Marland Kitchen. lisinopril (PRINIVIL,ZESTRIL) 10 MG tablet Take 1 tablet (10 mg total) by mouth daily.  . Melatonin 5 MG CAPS Take by mouth. Takes 4 night a week  . metFORMIN (GLUCOPHAGE) 1000 MG tablet Take 1 tablet (1,000 mg total) by mouth 2 (two) times daily with a meal.   No current facility-administered medications on file prior to visit.    Review of Systems  Constitutional: Negative for fever and chills.  Respiratory: Negative for chest tightness, shortness of breath and wheezing.   Cardiovascular: Negative for chest pain, palpitations and leg swelling.  Gastrointestinal: Negative for nausea and vomiting.  Genitourinary: Positive for dysuria, urgency, frequency, hematuria and flank pain. Negative for vaginal bleeding, vaginal discharge and vaginal pain.   Per HPI unless specifically indicated above     Objective:    BP 130/78 mmHg  Pulse 88  Temp(Src) 97.8 F (36.6 C) (Oral)  Resp 16  Ht 5\' 1"  (1.549 m)  Wt 232 lb (105.235 kg)  BMI 43.86 kg/m2  Wt Readings from Last 3 Encounters:  11/25/15 232 lb (105.235 kg)  11/09/15 230 lb (104.327 kg)  10/08/15 230 lb 3.2 oz (104.418 kg)    Physical Exam  Constitutional: She is  oriented to person, place, and time. She appears well-developed and well-nourished. No distress.  HENT:  Head: Normocephalic and atraumatic.  Cardiovascular: Normal rate and regular rhythm.  Exam reveals no gallop and no friction rub.   No murmur heard. Pulmonary/Chest: Effort normal and breath sounds normal. No respiratory distress.  Abdominal: Soft. Normal appearance. There is no CVA tenderness.  Neurological: She is alert and oriented to person, place, and time. No cranial nerve deficit. Coordination normal.  Skin: She is not diaphoretic.  Psychiatric: Her behavior is normal.   Results for orders placed or performed in visit on 11/25/15  POCT Urinalysis Dipstick  Result Value Ref Range   Color, UA pale yellow    Clarity, UA cloudy    Glucose, UA negative    Bilirubin, UA negative    Ketones, UA negative    Spec Grav, UA >=1.030    Blood, UA large    pH, UA 5.0    Protein, UA 2000    Urobilinogen, UA 0.2    Nitrite, UA pos    Leukocytes, UA Trace (A) Negative      Assessment & Plan:   Problem List Items Addressed This Visit      Other   Pedal edema    Need renewal of PRN HCTZ.       Relevant Medications   hydrochlorothiazide (HYDRODIURIL) 25 MG tablet    Other Visit Diagnoses    Acute cystitis with  hematuria    -  Primary    Treat with macrobid BID x 7 days. Urine culture sent. Supportive care at home. Alarms symptoms reviewed.     Relevant Medications    nitrofurantoin, macrocrystal-monohydrate, (MACROBID) 100 MG capsule    phenazopyridine (PYRIDIUM) 100 MG tablet    Other Relevant Orders    POCT Urinalysis Dipstick (Completed)    Urine Culture    Vaginal candida        Home treatment not working. Treat with diflucan orally. RTC if not resolved.     Relevant Medications    fluconazole (DIFLUCAN) 150 MG tablet       Meds ordered this encounter  Medications  . nitrofurantoin, macrocrystal-monohydrate, (MACROBID) 100 MG capsule    Sig: Take 1 capsule (100 mg  total) by mouth 2 (two) times daily.    Dispense:  14 capsule    Refill:  0    Order Specific Question:  Supervising Provider    Answer:  Janeann Forehand [[id number]]  . phenazopyridine (PYRIDIUM) 100 MG tablet    Sig: Take 1 tablet (100 mg total) by mouth 3 (three) times daily as needed for pain.    Dispense:  12 tablet    Refill:  0    Order Specific Question:  Supervising Provider    Answer:  Janeann Forehand [phone]  . fluconazole (DIFLUCAN) 150 MG tablet    Sig: Take 1 tablet (150 mg total) by mouth once.    Dispense:  1 tablet    Refill:  0    Order Specific Question:  Supervising Provider    Answer:  Janeann Forehand [[id number]]  . hydrochlorothiazide (HYDRODIURIL) 25 MG tablet    Sig: Take 1 tablet (25 mg total) by mouth daily.    Dispense:  30 tablet    Refill:  11    Order Specific Question:  Supervising Provider    Answer:  Janeann Forehand [phone]      Follow up plan: Return if symptoms worsen or fail to improve.

## 2015-11-27 LAB — URINE CULTURE

## 2015-12-03 ENCOUNTER — Encounter: Payer: Self-pay | Admitting: Family Medicine

## 2015-12-03 ENCOUNTER — Ambulatory Visit (INDEPENDENT_AMBULATORY_CARE_PROVIDER_SITE_OTHER): Payer: Medicaid Other | Admitting: Family Medicine

## 2015-12-03 VITALS — BP 126/83 | HR 88 | Temp 98.9°F | Resp 16 | Ht 61.0 in | Wt 229.0 lb

## 2015-12-03 DIAGNOSIS — F17200 Nicotine dependence, unspecified, uncomplicated: Secondary | ICD-10-CM

## 2015-12-03 DIAGNOSIS — I10 Essential (primary) hypertension: Secondary | ICD-10-CM | POA: Diagnosis not present

## 2015-12-03 DIAGNOSIS — E1142 Type 2 diabetes mellitus with diabetic polyneuropathy: Secondary | ICD-10-CM | POA: Diagnosis not present

## 2015-12-03 DIAGNOSIS — Z720 Tobacco use: Secondary | ICD-10-CM

## 2015-12-03 DIAGNOSIS — J0110 Acute frontal sinusitis, unspecified: Secondary | ICD-10-CM

## 2015-12-03 MED ORDER — AZITHROMYCIN 250 MG PO TABS: ORAL_TABLET | ORAL | Status: DC

## 2015-12-03 NOTE — Progress Notes (Signed)
Date:  12/03/2015   Name:  Carol Collins   DOB:  08/10/1976   MRN:  [id number]  PCP:  Filbert Berthold, NP    Chief Complaint: Sinusitis   History of Present Illness:  This is a 40 y.o. female with 10d hx rhinorrhea, sinus congestion, intermittent cough. Has tried Sudafed 12h bid for past 6d without response.  Review of Systems:  Review of Systems  Constitutional: Negative for fever.  HENT: Negative for ear pain and sore throat.   Respiratory: Negative for shortness of breath.   Neurological: Negative for syncope and light-headedness.    Patient Active Problem List   Diagnosis Date Noted  . Obesity, Class III, BMI 40-49.9 (morbid obesity) (HCC) 12/03/2015  . Pedal edema 11/09/2015  . Absolute anemia 10/08/2015  . Diabetes (HCC) 10/08/2015  . Hyperesthesia 10/08/2015  . BP (high blood pressure) 10/08/2015  . Excess, menstruation 10/08/2015  . Muscle ache 10/08/2015  . Adiposity 10/08/2015  . Acute infection of nasal sinus 10/08/2015  . Allergic rhinitis due to pollen 10/08/2015  . Smoker 10/08/2015    Prior to Admission medications   Medication Sig Start Date End Date Taking? Authorizing Provider  Alpha-Lipoic Acid 600 MG CAPS Take 1 capsule (600 mg total) by mouth daily. 10/08/15  Yes Amy Rusty Aus, NP  fluconazole (DIFLUCAN) 150 MG tablet Take 1 tablet (150 mg total) by mouth once. 11/25/15  Yes Amy Rusty Aus, NP  fluticasone (FLONASE) 50 MCG/ACT nasal spray Place 2 sprays into both nostrils daily. 10/08/15  Yes Amy Rusty Aus, NP  hydrochlorothiazide (HYDRODIURIL) 25 MG tablet Take 1 tablet (25 mg total) by mouth daily. 11/25/15  Yes Amy Rusty Aus, NP  lisinopril (PRINIVIL,ZESTRIL) 10 MG tablet Take 1 tablet (10 mg total) by mouth daily. 10/08/15  Yes Amy Rusty Aus, NP  Melatonin 5 MG CAPS Take by mouth. Takes 4 night a week   Yes Historical Provider, MD  metFORMIN (GLUCOPHAGE) 1000 MG tablet Take 1 tablet (1,000 mg total) by mouth 2 (two) times daily with a meal.  10/08/15  Yes Amy Rusty Aus, NP  azithromycin (ZITHROMAX) 250 MG tablet Take two tablets today then one tablet daily for 4 days 12/03/15   Schuyler Amor, MD    Allergies  Allergen Reactions  . Codeine   . Latex   . Provera [Medroxyprogesterone Acetate]     Causes myalgias and decreased libido    Past Surgical History  Procedure Laterality Date  . Abdominal hysterectomy    . Cesarean section    . Laproscopy      Social History  Substance Use Topics  . Smoking status: Current Some Day Smoker -- 0.25 packs/day    Types: Cigarettes  . Smokeless tobacco: Never Used  . Alcohol Use: None    Family History  Problem Relation Age of Onset  . Heart disease Father   . Diabetes Father   . Hyperlipidemia Father   . Hyperlipidemia Sister     Medication list has been reviewed and updated.  Physical Examination: BP 126/83 mmHg  Pulse 88  Temp(Src) 98.9 F (37.2 C) (Oral)  Resp 16  Ht 5\' 1"  (1.549 m)  Wt 229 lb (103.874 kg)  BMI 43.29 kg/m2  Physical Exam  Constitutional: She appears well-developed and well-nourished.  HENT:  Nose: Nose normal.  Mouth/Throat: Oropharynx is clear and moist. No oropharyngeal exudate.  Mod B frontal sinus tenderness  Neck: Neck supple.  Cardiovascular: Normal rate, regular rhythm and normal heart sounds.  Pulmonary/Chest: Effort normal.  Scattered expiratory wheezes  Lymphadenopathy:    She has no cervical adenopathy.  Neurological: She is alert.  Skin: Skin is warm and dry.  Psychiatric: She has a normal mood and affect. Her behavior is normal.  Nursing note and vitals reviewed.   Assessment and Plan:  1. Acute frontal sinusitis, recurrence not specified Persistent, unresponsive to decongestants, with bronchitis component, Zpak as directed  2. Essential hypertension Well controlled  3. Type 2 diabetes mellitus with diabetic polyneuropathy, without long-term current use of insulin (HCC) Well controlled, last a1c 6.8% in  November  4. Smoker Advise cessation  5. Obesity, Class III, BMI 40-49.9 (morbid obesity) (HCC)  Return if symptoms worsen or fail to improve.  Dionne AnoWilliam M. Kingsley SpittlePlonk, Jr. MD [facility]  12/03/2015

## 2015-12-10 ENCOUNTER — Ambulatory Visit: Payer: Medicaid Other | Admitting: Family Medicine

## 2015-12-22 LAB — HM DIABETES EYE EXAM

## 2016-01-25 ENCOUNTER — Ambulatory Visit: Payer: Medicaid Other | Admitting: Family Medicine

## 2016-01-26 ENCOUNTER — Ambulatory Visit: Payer: Medicaid Other | Admitting: Family Medicine

## 2016-01-26 ENCOUNTER — Encounter: Payer: Self-pay | Admitting: Family Medicine

## 2016-01-26 ENCOUNTER — Telehealth: Payer: Self-pay | Admitting: Family Medicine

## 2016-01-26 DIAGNOSIS — E1142 Type 2 diabetes mellitus with diabetic polyneuropathy: Secondary | ICD-10-CM

## 2016-01-26 DIAGNOSIS — I10 Essential (primary) hypertension: Secondary | ICD-10-CM

## 2016-01-26 NOTE — Progress Notes (Signed)
FMLA forms for blood pressure and diabetes completed.

## 2016-01-26 NOTE — Telephone Encounter (Signed)
DId she leave the paper work with you? Or is it in my box? Thanks! AK

## 2016-01-26 NOTE — Telephone Encounter (Signed)
Pt forgot to tell you that paper work had to be faxed in by March 15th.

## 2016-05-03 ENCOUNTER — Ambulatory Visit: Payer: Medicaid Other | Admitting: Family Medicine

## 2016-05-04 ENCOUNTER — Ambulatory Visit (INDEPENDENT_AMBULATORY_CARE_PROVIDER_SITE_OTHER): Payer: Medicaid Other | Admitting: Family Medicine

## 2016-05-04 ENCOUNTER — Encounter: Payer: Self-pay | Admitting: Family Medicine

## 2016-05-04 VITALS — BP 123/68 | HR 85 | Temp 98.6°F | Resp 16 | Ht 61.0 in | Wt 227.0 lb

## 2016-05-04 DIAGNOSIS — E1142 Type 2 diabetes mellitus with diabetic polyneuropathy: Secondary | ICD-10-CM | POA: Diagnosis not present

## 2016-05-04 DIAGNOSIS — D509 Iron deficiency anemia, unspecified: Secondary | ICD-10-CM | POA: Diagnosis not present

## 2016-05-04 DIAGNOSIS — R42 Dizziness and giddiness: Secondary | ICD-10-CM

## 2016-05-04 DIAGNOSIS — I10 Essential (primary) hypertension: Secondary | ICD-10-CM

## 2016-05-04 LAB — CBC WITH DIFFERENTIAL/PLATELET
BASOS ABS: 0 {cells}/uL (ref 0–200)
Basophils Relative: 0 %
EOS ABS: 222 {cells}/uL (ref 15–500)
Eosinophils Relative: 2 %
HCT: 41.4 % (ref 35.0–45.0)
HEMOGLOBIN: 14.3 g/dL (ref 11.7–15.5)
LYMPHS ABS: 2442 {cells}/uL (ref 850–3900)
Lymphocytes Relative: 22 %
MCH: 30.8 pg (ref 27.0–33.0)
MCHC: 34.5 g/dL (ref 32.0–36.0)
MCV: 89.2 fL (ref 80.0–100.0)
MPV: 10.9 fL (ref 7.5–12.5)
Monocytes Absolute: 444 cells/uL (ref 200–950)
Monocytes Relative: 4 %
NEUTROS ABS: 7992 {cells}/uL — AB (ref 1500–7800)
Neutrophils Relative %: 72 %
PLATELETS: 221 10*3/uL (ref 140–400)
RBC: 4.64 MIL/uL (ref 3.80–5.10)
RDW: 14.6 % (ref 11.0–15.0)
WBC: 11.1 10*3/uL — ABNORMAL HIGH (ref 3.8–10.8)

## 2016-05-04 LAB — TSH: TSH: 1.41 m[IU]/L

## 2016-05-04 LAB — POCT UA - MICROALBUMIN: Microalbumin Ur, POC: 50 mg/L

## 2016-05-04 MED ORDER — MECLIZINE HCL 25 MG PO TABS: 25.0000 mg | ORAL_TABLET | Freq: Three times a day (TID) | ORAL | Status: DC | PRN

## 2016-05-04 NOTE — Progress Notes (Signed)
Subjective:    Patient ID: Carol Collins, female    DOB: 1976/09/29, 40 y.o.   MRN: [id number]  HPI: Carol Collins is a 40 y.o. female presenting on 05/04/2016 for Dizziness   HPI  Pt presents for dizziness. This is an ongoing issue but worsening in past 3 weeks. Worse with standing up from bending. Has been taking  of HCTZ at home. Dizziness has started at home. Dizziness occurs off and on through the day. Sometimes mild. Gets tunnel vision at times- sits down and rests it will go away. Did fall at work. Has had a previous cardiac work-up. Not checking BP at home. Drinks water through the day. Dizziness occurs when she bends down to pick up object. No palpitations, no chest pain, no shortness of breath.  Pt has also not had diabetes follow-up in quite some time. Not check sugars at home. Some numbness in feet. Currently take Metformin BID for her diabetes.   Past Medical History  Diagnosis Date  . Hypertension   . Diabetes mellitus without complication [facility])    Past Surgical History  Procedure Laterality Date  . Abdominal hysterectomy    . Cesarean section    . Laproscopy       Current Outpatient Prescriptions on File Prior to Visit  Medication Sig  . Alpha-Lipoic Acid 600 MG CAPS Take 1 capsule (600 mg total) by mouth daily.  Marland Kitchen azithromycin (ZITHROMAX) 250 MG tablet Take two tablets today then one tablet daily for 4 days  . fluconazole (DIFLUCAN) 150 MG tablet Take 1 tablet (150 mg total) by mouth once.  . fluticasone (FLONASE) 50 MCG/ACT nasal spray Place 2 sprays into both nostrils daily.  . hydrochlorothiazide (HYDRODIURIL) 25 MG tablet Take 1 tablet (25 mg total) by mouth daily.  Marland Kitchen lisinopril (PRINIVIL,ZESTRIL) 10 MG tablet Take 1 tablet (10 mg total) by mouth daily.  . Melatonin 5 MG CAPS Take by mouth. Takes 4 night a week  . metFORMIN (GLUCOPHAGE) 1000 MG tablet Take 1 tablet (1,000 mg total) by mouth 2 (two) times daily with a meal.   No current  facility-administered medications on file prior to visit.    Review of Systems  Constitutional: Negative for fever and chills.  HENT: Negative.   Respiratory: Negative for cough, chest tightness and wheezing.   Cardiovascular: Negative for chest pain and leg swelling.  Gastrointestinal: Negative for nausea, vomiting, abdominal pain, diarrhea and constipation.  Endocrine: Negative.  Negative for cold intolerance, heat intolerance, polydipsia, polyphagia and polyuria.  Genitourinary: Negative for dysuria and difficulty urinating.  Musculoskeletal: Negative.   Neurological: Positive for dizziness. Negative for syncope, light-headedness and numbness.  Psychiatric/Behavioral: Negative.    Per HPI unless specifically indicated above     Objective:    BP 123/68 mmHg  Pulse 85  Temp(Src) 98.6 F (37 C) (Oral)  Resp 16  Ht  (1.549 m)  Wt 227 lb (102.967 kg)  BMI 42.91 kg/m2  Wt Readings from Last 3 Encounters:  05/04/16 227 lb (102.967 kg)  06/10/15 226 lb (102.513 kg)  12/03/15 229 lb (103.874 kg)    Physical Exam  Constitutional: She is oriented to person, place, and time. She appears well-developed and well-nourished.  HENT:  Head: Normocephalic and atraumatic.  Right Ear: Hearing and tympanic membrane normal.  Left Ear: Hearing and tympanic membrane normal.  No lateralization. AC>BC.  Eyes: Pupils are equal, round, and reactive to light. Right eye exhibits nystagmus (To left and looking up. ). Left  eye exhibits nystagmus (To left and looking up. ).  Neck: Neck supple.  Cardiovascular: Normal rate, regular rhythm and normal heart sounds.  Exam reveals no gallop and no friction rub.   No murmur heard. Pulmonary/Chest: Effort normal and breath sounds normal. She has no wheezes. She exhibits no tenderness.  Abdominal: Soft. Normal appearance and bowel sounds are normal. She exhibits no distension and no mass. There is no tenderness. There is no rebound and no guarding.    Musculoskeletal: Normal range of motion. She exhibits no edema or tenderness.  Lymphadenopathy:    She has no cervical adenopathy.  Neurological: She is alert and oriented to person, place, and time. She has normal strength and normal reflexes. No cranial nerve deficit or sensory deficit. She displays a negative Romberg sign.  Skin: Skin is warm and dry.   Diabetic Foot Exam - Simple   Simple Foot Form  Diabetic Foot exam was performed with the following findings:  Yes 05/04/2016  9:50 AM  Visual Inspection  No deformities, no ulcerations, no other skin breakdown bilaterally:  Yes  Sensation Testing  Intact to touch and monofilament testing bilaterally:  Yes  Pulse Check  Posterior Tibialis and Dorsalis pulse intact bilaterally:  Yes  Comments      Results for orders placed or performed in visit on 05/04/16  POCT UA - Microalbumin  Result Value Ref Range   Microalbumin Ur, POC 50 mg/L   Creatinine, POC  mg/dL   Albumin/Creatinine Ratio, Urine, POC        Assessment & Plan:   Problem List Items Addressed This Visit      Cardiovascular and Mediastinum   BP (high blood pressure)    Reduce HCTZ to 12.5mg  once daily to help with dizziness. BP recheck in 4 weeks.         Endocrine   Diabetes (HCC) - Primary    Check A1c. UA microalbumin done today. Encouraged diet and exercise to help control sugars. Plan for full diabetes visit at next follow-up.       Relevant Orders   Hemoglobin A1c   Lipid Profile   COMPLETE METABOLIC PANEL WITH GFR   POCT UA - Microalbumin (Completed)     Other   Iron deficiency anemia    Recheck CBC.       Relevant Orders   CBC with Differential/Platelet   Dizziness    BP related vs BPPV. Trial of meclizine. Check CBC, CMET, TSH today. 1/2 dose of HCTZ to see if it is blood pressure and heat related. Alarm symptoms reviewed. Recheck 2-4 weeks.       Relevant Medications   meclizine (ANTIVERT) 25 MG tablet   Other Relevant Orders   CBC  with Differential/Platelet   TSH      Meds ordered this encounter  Medications  . meclizine (ANTIVERT) 25 MG tablet    Sig: Take 1 tablet (25 mg total) by mouth 3 (three) times daily as needed for dizziness.    Dispense:  30 tablet    Refill:  11    Order Specific Question:  Supervising Provider    Answer:  Janeann ForehandHAWKINS JR, JAMES H [phone][[id number]]      Follow up plan: Return in about 4 weeks (around 06/01/2016) for dizziness. .Marland Kitchen

## 2016-05-04 NOTE — Assessment & Plan Note (Signed)
Recheck CBC. 

## 2016-05-04 NOTE — Assessment & Plan Note (Signed)
Reduce HCTZ to 12.5mg  once daily to help with dizziness. BP recheck in 4 weeks.

## 2016-05-04 NOTE — Assessment & Plan Note (Signed)
BP related vs BPPV. Trial of meclizine. Check CBC, CMET, TSH today. 1/2 dose of HCTZ to see if it is blood pressure and heat related. Alarm symptoms reviewed. Recheck 2-4 weeks.

## 2016-05-04 NOTE — Patient Instructions (Signed)
Take 1/2 tablet of HCTZ daily to see if that helps with dizziness. Also try taking meclizine for dizziness at needed.   If you have severe dizziness, pass out, or any concerning symptoms, head to the ER.

## 2016-05-04 NOTE — Assessment & Plan Note (Signed)
Check A1c. UA microalbumin done today. Encouraged diet and exercise to help control sugars. Plan for full diabetes visit at next follow-up.

## 2016-05-05 LAB — LIPID PANEL
Cholesterol: 154 mg/dL (ref 125–200)
HDL: 41 mg/dL — AB (ref >=46)
LDL CALC: 74 mg/dL (ref <130)
Total CHOL/HDL Ratio: 3.8 Ratio (ref <=5.0)
Triglycerides: 193 mg/dL — ABNORMAL HIGH (ref <150)
VLDL: 39 mg/dL — ABNORMAL HIGH (ref <30)

## 2016-05-05 LAB — COMPLETE METABOLIC PANEL WITH GFR
ALT: 35 U/L — AB (ref 6–29)
AST: 32 U/L — AB (ref 10–30)
Albumin: 4.1 g/dL (ref 3.6–5.1)
Alkaline Phosphatase: 68 U/L (ref 33–115)
BILIRUBIN TOTAL: 0.6 mg/dL (ref 0.2–1.2)
BUN: 10 mg/dL (ref 7–25)
CHLORIDE: 96 mmol/L — AB (ref 98–110)
CO2: 26 mmol/L (ref 20–31)
CREATININE: 0.78 mg/dL (ref 0.50–1.10)
Calcium: 9.7 mg/dL (ref 8.6–10.2)
GFR, Est Non African American: >89 mL/min (ref >=60)
GLUCOSE: 173 mg/dL — AB (ref 65–99)
Potassium: 3.9 mmol/L (ref 3.5–5.3)
SODIUM: 135 mmol/L (ref 135–146)
TOTAL PROTEIN: 6.7 g/dL (ref 6.1–8.1)

## 2016-05-05 LAB — HEMOGLOBIN A1C
HEMOGLOBIN A1C: 8.1 % — AB (ref <5.7)
MEAN PLASMA GLUCOSE: 186 mg/dL

## 2016-05-08 ENCOUNTER — Other Ambulatory Visit: Payer: Self-pay | Admitting: Family Medicine

## 2016-05-08 MED ORDER — GLIMEPIRIDE 1 MG PO TABS: 1.0000 mg | ORAL_TABLET | Freq: Every day | ORAL | Status: DC

## 2016-05-24 ENCOUNTER — Ambulatory Visit: Payer: Medicaid Other | Admitting: Family Medicine

## 2016-05-25 ENCOUNTER — Ambulatory Visit (INDEPENDENT_AMBULATORY_CARE_PROVIDER_SITE_OTHER): Payer: Medicaid Other | Admitting: Family Medicine

## 2016-05-25 VITALS — BP 136/72 | HR 101 | Temp 98.7°F | Resp 16 | Ht 61.0 in | Wt 232.0 lb

## 2016-05-25 DIAGNOSIS — I10 Essential (primary) hypertension: Secondary | ICD-10-CM | POA: Diagnosis not present

## 2016-05-25 DIAGNOSIS — E1142 Type 2 diabetes mellitus with diabetic polyneuropathy: Secondary | ICD-10-CM

## 2016-05-25 DIAGNOSIS — H9193 Unspecified hearing loss, bilateral: Secondary | ICD-10-CM

## 2016-05-25 DIAGNOSIS — R42 Dizziness and giddiness: Secondary | ICD-10-CM | POA: Diagnosis not present

## 2016-05-25 LAB — CBC WITH DIFFERENTIAL/PLATELET
BASOS PCT: 0 %
Basophils Absolute: 0 cells/uL (ref 0–200)
EOS ABS: 238 {cells}/uL (ref 15–500)
Eosinophils Relative: 2 %
HCT: 39 % (ref 35.0–45.0)
Hemoglobin: 13.4 g/dL (ref 11.7–15.5)
Lymphocytes Relative: 21 %
Lymphs Abs: 2499 cells/uL (ref 850–3900)
MCH: 30.7 pg (ref 27.0–33.0)
MCHC: 34.4 g/dL (ref 32.0–36.0)
MCV: 89.4 fL (ref 80.0–100.0)
MONO ABS: 476 {cells}/uL (ref 200–950)
MONOS PCT: 4 %
MPV: 10.8 fL (ref 7.5–12.5)
NEUTROS ABS: 8687 {cells}/uL — AB (ref 1500–7800)
Neutrophils Relative %: 73 %
PLATELETS: 212 10*3/uL (ref 140–400)
RBC: 4.36 MIL/uL (ref 3.80–5.10)
RDW: 14.3 % (ref 11.0–15.0)
WBC: 11.9 10*3/uL — AB (ref 3.8–10.8)

## 2016-05-25 NOTE — Patient Instructions (Signed)
We will have you seen by cardiology to rule that out as a source of your symptoms. We will also have you seen by ENT to work-up the cause of your symptoms.

## 2016-05-25 NOTE — Assessment & Plan Note (Signed)
Continue current regimen. Sugars avg 150. Hypoglycemia does not seem contributory to symptoms.

## 2016-05-25 NOTE — Progress Notes (Signed)
Subjective:    Patient ID: Carol Collins, female    DOB: [birth year], 40 y.o.   MRN: [id number]  HPI: Carol Collins is a 40 y.o. female presenting on 05/25/2016 for Dizziness   HPI  Pt presents for dizziness follow-up. Meclizine was helping for dizziness Has slowed down and changing positions.  Did have an episode for dizziness fell out to the floor. When she took the meclizine and amaryl together- she felt very dizzy and almost passed out. When she takes them apart.  Still having dizziness 10-12x per day. No passing out. Usually happens with changing positions. Bending over and going up ladder. No chest pain or palpitation. Symptoms described as feeling like head is stuffed with cotton wool. She is reporting hearing is worsening as dizziness is progressing.  Symptoms worse with exertion and heat.  Past Medical History  Diagnosis Date  . Hypertension   . Diabetes mellitus without complication [facility])     Current Outpatient Prescriptions on File Prior to Visit  Medication Sig  . Alpha-Lipoic Acid 600 MG CAPS Take 1 capsule (600 mg total) by mouth daily.  . fluticasone (FLONASE) 50 MCG/ACT nasal spray Place 2 sprays into both nostrils daily.  Marland Kitchen glimepiride (AMARYL) 1 MG tablet Take 1 tablet (1 mg total) by mouth daily with breakfast.  . hydrochlorothiazide (HYDRODIURIL) 25 MG tablet Take 1 tablet (25 mg total) by mouth daily.  Marland Kitchen lisinopril (PRINIVIL,ZESTRIL) 10 MG tablet Take 1 tablet (10 mg total) by mouth daily.  . meclizine (ANTIVERT) 25 MG tablet Take 1 tablet (25 mg total) by mouth 3 (three) times daily as needed for dizziness.  . Melatonin 5 MG CAPS Take by mouth. Takes 4 night a week  . metFORMIN (GLUCOPHAGE) 1000 MG tablet Take 1 tablet (1,000 mg total) by mouth 2 (two) times daily with a meal.   No current facility-administered medications on file prior to visit.    Review of Systems  Constitutional: Negative for fever and chills.  HENT: Positive for hearing loss. Negative  for congestion and tinnitus.   Eyes: Negative for visual disturbance.  Respiratory: Negative for cough, chest tightness and wheezing.   Cardiovascular: Negative for chest pain and leg swelling.  Gastrointestinal: Negative for nausea, vomiting, abdominal pain, diarrhea and constipation.  Endocrine: Negative.  Negative for cold intolerance, heat intolerance, polydipsia, polyphagia and polyuria.  Genitourinary: Negative for dysuria and difficulty urinating.  Musculoskeletal: Negative.   Neurological: Positive for dizziness. Negative for light-headedness and numbness.       Pre-syncopal episode   Psychiatric/Behavioral: Negative.    Per HPI unless specifically indicated above     Objective:    BP 136/72 mmHg  Pulse 101  Temp(Src) 98.7 F (37.1 C) (Oral)  Resp 16  Ht 5' 1"  (1.549 m)  Wt 232 lb (105.235 kg)  BMI 43.86 kg/m2  Wt Readings from Last 3 Encounters:  05/25/16 232 lb (105.235 kg)  05/04/16 227 lb (102.967 kg)  06/10/15 226 lb (102.513 kg)    Physical Exam  Constitutional: She is oriented to person, place, and time. She appears well-developed and well-nourished.  HENT:  Head: Normocephalic and atraumatic.  Right Ear: Tympanic membrane normal.  Left Ear: Hearing normal.  No lateralization. AC >BC. Conversational hearing intact.   Eyes: Conjunctivae are normal. Pupils are equal, round, and reactive to light. Right eye exhibits no discharge and no exudate. Left eye exhibits no discharge and no exudate. Right eye exhibits nystagmus. Left eye exhibits nystagmus.  Neck: Neck  supple.  Cardiovascular: Normal rate, regular rhythm and normal heart sounds.  Exam reveals no gallop and no friction rub.   No murmur heard. Pulmonary/Chest: Effort normal and breath sounds normal. She has no wheezes. She exhibits no tenderness.  Abdominal: Soft. Normal appearance and bowel sounds are normal. She exhibits no distension and no mass. There is no tenderness. There is no rebound and no  guarding.  Musculoskeletal: Normal range of motion. She exhibits no edema or tenderness.  Lymphadenopathy:    She has no cervical adenopathy.  Neurological: She is alert and oriented to person, place, and time.  Skin: Skin is warm and dry.   Results for orders placed or performed in visit on 05/04/16  CBC with Differential/Platelet  Result Value Ref Range   WBC 11.1 (H) 3.8 - 10.8 K/uL   RBC 4.64 3.80 - 5.10 MIL/uL   Hemoglobin 14.3 11.7 - 15.5 g/dL   HCT 41.4 35.0 - 45.0 %   MCV 89.2 80.0 - 100.0 fL   MCH 30.8 27.0 - 33.0 pg   MCHC 34.5 32.0 - 36.0 g/dL   RDW 14.6 11.0 - 15.0 %   Platelets 221 140 - 400 K/uL   MPV 10.9 7.5 - 12.5 fL   Neutro Abs 7992 (H) 1500 - 7800 cells/uL   Lymphs Abs 2442 850 - 3900 cells/uL   Monocytes Absolute 444 200 - 950 cells/uL   Eosinophils Absolute 222 15 - 500 cells/uL   Basophils Absolute 0 0 - 200 cells/uL   Neutrophils Relative % 72 %   Lymphocytes Relative 22 %   Monocytes Relative 4 %   Eosinophils Relative 2 %   Basophils Relative 0 %   Smear Review Criteria for review not met   Hemoglobin A1c  Result Value Ref Range   Hgb A1c MFr Bld 8.1 (H) <5.7 %   Mean Plasma Glucose 186 mg/dL  Lipid Profile  Result Value Ref Range   Cholesterol 154 125 - 200 mg/dL   Triglycerides 193 (H) <150 mg/dL   HDL 41 (L) >=46 mg/dL   Total CHOL/HDL Ratio 3.8 <=5.0 Ratio   VLDL 39 (H) <30 mg/dL   LDL Cholesterol 74 <130 mg/dL  TSH  Result Value Ref Range   TSH 1.41 mIU/L  COMPLETE METABOLIC PANEL WITH GFR  Result Value Ref Range   Sodium 135 135 - 146 mmol/L   Potassium 3.9 3.5 - 5.3 mmol/L   Chloride 96 (L) 98 - 110 mmol/L   CO2 26 20 - 31 mmol/L   Glucose, Bld 173 (H) 65 - 99 mg/dL   BUN 10 7 - 25 mg/dL   Creat 0.78 0.50 - 1.10 mg/dL   Total Bilirubin 0.6 0.2 - 1.2 mg/dL   Alkaline Phosphatase 68 33 - 115 U/L   AST 32 (H) 10 - 30 U/L   ALT 35 (H) 6 - 29 U/L   Total Protein 6.7 6.1 - 8.1 g/dL   Albumin 4.1 3.6 - 5.1 g/dL   Calcium 9.7 8.6 -  10.2 mg/dL   GFR, Est African American >89 >=60 mL/min   GFR, Est Non African American >89 >=60 mL/min  POCT UA - Microalbumin  Result Value Ref Range   Microalbumin Ur, POC 50 mg/L   Creatinine, POC  mg/dL   Albumin/Creatinine Ratio, Urine, POC        Assessment & Plan:   Problem List Items Addressed This Visit      Cardiovascular and Mediastinum   BP (high blood pressure)  Endocrine   Diabetes ([place])    Continue current regimen. Sugars avg 150. Hypoglycemia does not seem contributory to symptoms.         Other   Dizziness - Primary    Some improvement with meclizine. Worsening with heat and exertion. R/o cardiac cause of issues- due to presyncopal episodes and increasing frequency. ENT referral to work up dizziness with accompanying hearing loss.  Alarm symptoms reviewed.       Relevant Orders   EKG 12-Lead   CBC with Differential/Platelet   Ambulatory referral to Cardiology   Ambulatory referral to ENT    Other Visit Diagnoses    Hearing loss, bilateral        Refer to ENT for evaluation.     Relevant Orders    Ambulatory referral to ENT       No orders of the defined types were placed in this encounter.      Follow up plan: Return in about 2 months (around 08/05/2016) for diabetes. Marland Kitchen

## 2016-05-25 NOTE — Assessment & Plan Note (Signed)
Some improvement with meclizine. Worsening with heat and exertion. R/o cardiac cause of issues- due to presyncopal episodes and increasing frequency. ENT referral to work up dizziness with accompanying hearing loss.  Alarm symptoms reviewed.

## 2016-06-02 ENCOUNTER — Telehealth: Payer: Self-pay | Admitting: *Deleted

## 2016-06-02 NOTE — Telephone Encounter (Signed)
Referral was submitted to [facility]. Patient must contact financial dept before appt can be scheduled.[place]

## 2016-06-09 ENCOUNTER — Ambulatory Visit (INDEPENDENT_AMBULATORY_CARE_PROVIDER_SITE_OTHER): Payer: Medicaid Other | Admitting: Family Medicine

## 2016-06-09 VITALS — BP 119/70 | HR 100 | Temp 98.6°F | Resp 16 | Ht 61.0 in | Wt 236.0 lb

## 2016-06-09 DIAGNOSIS — I10 Essential (primary) hypertension: Secondary | ICD-10-CM

## 2016-06-09 DIAGNOSIS — R42 Dizziness and giddiness: Secondary | ICD-10-CM | POA: Diagnosis not present

## 2016-06-09 NOTE — Assessment & Plan Note (Signed)
Stopping HCTZ. Pt cautioned to monitor BP closely at home. Stay hydrated. Will recheck BP in 1 mos. Restart at a lower dose if HCTZ is contributing to dizziness.

## 2016-06-09 NOTE — Progress Notes (Signed)
Subjective:    Patient ID: Carol Collins, female    DOB: 1976/09/08, 40 y.o.   MRN: [id number]  HPI: Carol Collins is a 40 y.o. female presenting on 06/09/2016 for Dizziness   HPI  Pt presents for follow-up of dizziness. This is ongoing issue. She is awaiting cardiology on 8/1 and ENT on 8/4 for her dizziness. Trying to avoid activities that cause dizziness. Dizziness is now occurring daily. Worse at work. She drinks 6-7 568m bottles of water per day. Taking a full tablet of HCTZ daily. Having trouble splitting the tablet.  Pt did remember that she hit her head 1.5years ago at work.   Past Medical History  Diagnosis Date  . Hypertension   . Diabetes mellitus without complication ([facility]     Current Outpatient Prescriptions on File Prior to Visit  Medication Sig  . Alpha-Lipoic Acid 600 MG CAPS Take 1 capsule (600 mg total) by mouth daily.  . fluticasone (FLONASE) 50 MCG/ACT nasal spray Place 2 sprays into both nostrils daily.  .Marland Kitchenglimepiride (AMARYL) 1 MG tablet Take 1 tablet (1 mg total) by mouth daily with breakfast.  . hydrochlorothiazide (HYDRODIURIL) 25 MG tablet Take 1 tablet (25 mg total) by mouth daily.  .Marland Kitchenlisinopril (PRINIVIL,ZESTRIL) 10 MG tablet Take 1 tablet (10 mg total) by mouth daily.  . meclizine (ANTIVERT) 25 MG tablet Take 1 tablet (25 mg total) by mouth 3 (three) times daily as needed for dizziness.  . Melatonin 5 MG CAPS Take by mouth. Takes 4 night a week  . metFORMIN (GLUCOPHAGE) 1000 MG tablet Take 1 tablet (1,000 mg total) by mouth 2 (two) times daily with a meal.   No current facility-administered medications on file prior to visit.    Review of Systems  Constitutional: Negative for fever and chills.  HENT: Negative.  Negative for tinnitus.   Respiratory: Negative for cough, chest tightness and wheezing.   Cardiovascular: Negative for chest pain, palpitations and leg swelling.  Gastrointestinal: Negative for nausea, vomiting, abdominal pain, diarrhea  and constipation.  Endocrine: Negative.  Negative for cold intolerance, heat intolerance, polydipsia, polyphagia and polyuria.  Genitourinary: Negative for dysuria and difficulty urinating.  Musculoskeletal: Negative.   Neurological: Positive for dizziness. Negative for syncope, light-headedness, numbness and headaches.  Psychiatric/Behavioral: Negative.    Per HPI unless specifically indicated above     Objective:    BP 119/70 mmHg  Pulse 100  Temp(Src) 98.6 F (37 C) (Oral)  Resp 16  Ht 5' 1"  (1.549 m)  Wt 236 lb (107.049 kg)  BMI 44.61 kg/m2  Wt Readings from Last 3 Encounters:  06/09/16 236 lb (107.049 kg)  05/25/16 232 lb (105.235 kg)  05/04/16 227 lb (102.967 kg)    Physical Exam  Constitutional: She is oriented to person, place, and time. She appears well-developed and well-nourished.  HENT:  Head: Normocephalic and atraumatic.  Eyes: Conjunctivae are normal. Pupils are equal, round, and reactive to light. Right eye exhibits nystagmus. Left eye exhibits nystagmus.  Neck: Neck supple.  Cardiovascular: Normal rate, regular rhythm and normal heart sounds.  Exam reveals no gallop and no friction rub.   No murmur heard. Pulses:      Radial pulses are 2+ on the right side, and 2+ on the left side.  Pulmonary/Chest: Effort normal and breath sounds normal. She has no wheezes. She exhibits no tenderness.  Abdominal: Soft. Normal appearance and bowel sounds are normal. She exhibits no distension and no mass. There is no tenderness. There  is no rebound and no guarding.  Musculoskeletal: Normal range of motion. She exhibits no edema or tenderness.  Lymphadenopathy:    She has no cervical adenopathy.  Neurological: She is alert and oriented to person, place, and time. She has normal strength. No cranial nerve deficit or sensory deficit. She displays a negative Romberg sign.  Skin: Skin is warm and dry.   Results for orders placed or performed in visit on 05/25/16  CBC with  Differential/Platelet  Result Value Ref Range   WBC 11.9 (H) 3.8 - 10.8 K/uL   RBC 4.36 3.80 - 5.10 MIL/uL   Hemoglobin 13.4 11.7 - 15.5 g/dL   HCT 39.0 35.0 - 45.0 %   MCV 89.4 80.0 - 100.0 fL   MCH 30.7 27.0 - 33.0 pg   MCHC 34.4 32.0 - 36.0 g/dL   RDW 14.3 11.0 - 15.0 %   Platelets 212 140 - 400 K/uL   MPV 10.8 7.5 - 12.5 fL   Neutro Abs 8687 (H) 1500 - 7800 cells/uL   Lymphs Abs 2499 850 - 3900 cells/uL   Monocytes Absolute 476 200 - 950 cells/uL   Eosinophils Absolute 238 15 - 500 cells/uL   Basophils Absolute 0 0 - 200 cells/uL   Neutrophils Relative % 73 %   Lymphocytes Relative 21 %   Monocytes Relative 4 %   Eosinophils Relative 2 %   Basophils Relative 0 %   Smear Review Criteria for review not met       Assessment & Plan:   Problem List Items Addressed This Visit      Cardiovascular and Mediastinum   BP (high blood pressure)    Stopping HCTZ. Pt cautioned to monitor BP closely at home. Stay hydrated. Will recheck BP in 1 mos. Restart at a lower dose if HCTZ is contributing to dizziness.         Other   Dizziness - Primary    Awaiting ENT and cardiology referral. Orthostatics negative 116/70 and 118/72 standing. Seems to be worsened with heat and HCTZ. Will stop for now. Also worsened by glimepiride. Will hold for now. Alarm symptoms reviewed with patient. Will follow-up in 1 mos.          No orders of the defined types were placed in this encounter.      Follow up plan: Return in about 1 month (around 07/10/2016) for Dizziness. Marland Kitchen

## 2016-06-09 NOTE — Patient Instructions (Addendum)
STOP your Amaryl for now since it seems to be worsening dizziness.  STOP your HCTZ for now as well to see if that helps with dizziness.  Please keep your cardiology appt and ENT to work-up the frequent dizziness.   Please go to the ER if you pass out, hit your head, have chest pain, have severe HA, numbness or tingling on one side of your body or any concerning symptoms.

## 2016-06-09 NOTE — Assessment & Plan Note (Signed)
Awaiting ENT and cardiology referral. Orthostatics negative 116/70 and 118/72 standing. Seems to be worsened with heat and HCTZ. Will stop for now. Also worsened by glimepiride. Will hold for now. Alarm symptoms reviewed with patient. Will follow-up in 1 mos.

## 2016-06-20 ENCOUNTER — Ambulatory Visit: Payer: Self-pay | Admitting: Cardiology

## 2016-06-23 ENCOUNTER — Other Ambulatory Visit: Payer: Self-pay | Admitting: Otolaryngology

## 2016-06-23 DIAGNOSIS — H6983 Other specified disorders of Eustachian tube, bilateral: Secondary | ICD-10-CM | POA: Diagnosis not present

## 2016-06-23 DIAGNOSIS — R42 Dizziness and giddiness: Secondary | ICD-10-CM | POA: Diagnosis not present

## 2016-06-23 DIAGNOSIS — H9190 Unspecified hearing loss, unspecified ear: Secondary | ICD-10-CM

## 2016-07-04 ENCOUNTER — Ambulatory Visit: Payer: Medicaid Other | Admitting: Family Medicine

## 2016-07-05 ENCOUNTER — Ambulatory Visit
Admission: RE | Admit: 2016-07-05 | Discharge: 2016-07-05 | Disposition: A | Discharge status: Home / Self Care | Payer: Medicaid Other | Source: Ambulatory Visit | Attending: Otolaryngology | Admitting: Otolaryngology

## 2016-07-05 ENCOUNTER — Encounter: Payer: Self-pay | Admitting: Radiology

## 2016-07-05 DIAGNOSIS — H9190 Unspecified hearing loss, unspecified ear: Secondary | ICD-10-CM | POA: Insufficient documentation

## 2016-07-05 DIAGNOSIS — R42 Dizziness and giddiness: Secondary | ICD-10-CM

## 2016-07-05 LAB — POCT I-STAT CREATININE: CREATININE: 0.7 mg/dL (ref 0.44–1.00)

## 2016-07-05 MED ORDER — GADOBENATE DIMEGLUMINE 529 MG/ML IV SOLN
20.0000 mL | Freq: Once | INTRAVENOUS | Status: AC | PRN
  Administered 2016-07-05: 20 mL via INTRAVENOUS

## 2016-07-07 ENCOUNTER — Ambulatory Visit (INDEPENDENT_AMBULATORY_CARE_PROVIDER_SITE_OTHER): Payer: Medicaid Other | Admitting: Family Medicine

## 2016-07-07 VITALS — BP 130/88 | HR 99 | Temp 98.7°F | Resp 16 | Ht 61.0 in | Wt 230.0 lb

## 2016-07-07 DIAGNOSIS — R42 Dizziness and giddiness: Secondary | ICD-10-CM

## 2016-07-07 NOTE — Progress Notes (Signed)
Subjective:    Patient ID: Carol Collins, female    DOB: 06/07/1976, 40 y.o.   MRN: [id number]  HPI: Carol Collins is a 40 y.o. female presenting on 07/07/2016 for paperwork   HPI  Pt presents for follow-up of dizziness and updating FMLA paperwork.Malvin Johns. Saw ENT- they did MRI- negative. Carotid dopplers negative. Will see cardiology on 8/23 for work-up. Pt will follow-up with ENT on 8/22 to discuss treatment.  Pt is having difficulty completing her duties at work 2/2 dizziness. She has to climb ladders and has almost fallen several times 2/2 symptoms.    Past Medical History:  Diagnosis Date  . Diabetes mellitus without complication ([facility])   . Hypertension     Current Outpatient Prescriptions on File Prior to Visit  Medication Sig  . Alpha-Lipoic Acid 600 MG CAPS Take 1 capsule (600 mg total) by mouth daily.  . fluticasone (FLONASE) 50 MCG/ACT nasal spray Place 2 sprays into both nostrils daily.  Marland Kitchen. glimepiride (AMARYL) 1 MG tablet Take 1 tablet (1 mg total) by mouth daily with breakfast.  . hydrochlorothiazide (HYDRODIURIL) 25 MG tablet Take 1 tablet (25 mg total) by mouth daily.  Marland Kitchen. lisinopril (PRINIVIL,ZESTRIL) 10 MG tablet Take 1 tablet (10 mg total) by mouth daily.  . meclizine (ANTIVERT) 25 MG tablet Take 1 tablet (25 mg total) by mouth 3 (three) times daily as needed for dizziness.  . Melatonin 5 MG CAPS Take by mouth. Takes 4 night a week  . metFORMIN (GLUCOPHAGE) 1000 MG tablet Take 1 tablet (1,000 mg total) by mouth 2 (two) times daily with a meal.   No current facility-administered medications on file prior to visit.     Review of Systems  Constitutional: Negative for chills and fever.  HENT: Positive for tinnitus.   Respiratory: Negative for cough, chest tightness and wheezing.   Cardiovascular: Negative for chest pain and leg swelling.  Gastrointestinal: Negative for abdominal pain, constipation, diarrhea, nausea and vomiting.  Endocrine: Negative.  Negative for cold  intolerance, heat intolerance, polydipsia, polyphagia and polyuria.  Genitourinary: Negative for difficulty urinating and dysuria.  Musculoskeletal: Negative.   Neurological: Positive for dizziness. Negative for light-headedness and numbness.       Presyncope.   Psychiatric/Behavioral: Negative.    Per HPI unless specifically indicated above     Objective:    BP 130/88 (BP Location: Left Arm)   Pulse 99   Temp 98.7 F (37.1 C) (Oral)   Resp 16   Ht 5\' 1"  (1.549 m)   Wt 230 lb (104.3 kg)   LMP 11/20/2013 Comment: hysterectomy  BMI 43.46 kg/m   Wt Readings from Last 3 Encounters:  07/07/16 230 lb (104.3 kg)  06/09/16 236 lb (107 kg)  05/25/16 232 lb (105.2 kg)    Physical Exam  Constitutional: She is oriented to person, place, and time. She appears well-developed and well-nourished.  HENT:  Head: Normocephalic and atraumatic.  Neck: Neck supple.  Cardiovascular: Normal rate, regular rhythm and normal heart sounds.  Exam reveals no gallop and no friction rub.   No murmur heard. Pulmonary/Chest: Effort normal and breath sounds normal. She has no wheezes. She exhibits no tenderness.  Abdominal: Soft. Normal appearance and bowel sounds are normal. She exhibits no distension and no mass. There is no tenderness. There is no rebound and no guarding.  Musculoskeletal: Normal range of motion. She exhibits no edema or tenderness.  Lymphadenopathy:    She has no cervical adenopathy.  Neurological: She is alert  and oriented to person, place, and time.  Skin: Skin is warm and dry.   Results for orders placed or performed during the hospital encounter of 07/05/16  I-STAT creatinine  Result Value Ref Range   Creatinine, Ser 0.70 0.44 - 1.00 mg/dL      Assessment & Plan:   Problem List Items Addressed This Visit      Other   Dizziness - Primary    FMLA paperwork completed. Pt encouraged to continue to follow-up with cardiology and ENT regarding her symptoms. TO ER for syncope or  severe symptoms.        Other Visit Diagnoses   None.     No orders of the defined types were placed in this encounter.     Follow up plan: Return in about 4 weeks (around 08/04/2016), or if symptoms worsen or fail to improve, for dizziness, .

## 2016-07-07 NOTE — Assessment & Plan Note (Signed)
FMLA paperwork completed. Pt encouraged to continue to follow-up with cardiology and ENT regarding her symptoms. TO ER for syncope or severe symptoms.

## 2016-07-07 NOTE — Patient Instructions (Signed)
Please keep your appts with cardiology and ENT for follow-up on dizziness.

## 2016-07-11 NOTE — Progress Notes (Signed)
Cardiology Office Note   Date:  07/12/2016   ID:  Carol Collins, DOB 06/04/1976, MRN [id number]  Referring Doctor:  Filbert BertholdAmy L Krebs, NP   Cardiologist:   Almond LintAileen Jasyn Mey, MD   Reason for consultation:  Chief Complaint  Patient presents with  . Other    Ref by Krebs for dizziness. Meds reviewed by the patient verbally.       History of Present Illness: Carol Collins is a 40 y.o. female who presents for Cardiac evaluation for symptoms of dizziness.  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 She has to bend down, look up, and changed positions a lot of times during her work. She has filed for [employer] and has since switched positions. 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 And since starting that position, she has had no dizziness. She also has been seen by ENT and was told that there may be some issues that are related to her dizziness.  She reports tingling sensation on both her feet. She describes her feet to be very sensitive to touch. She describes some pain and mild cramping may be at rest and sometimes with walking.  She denies true syncope. She denies chest pain, chest tightness, shortness of breath, palpitations. No PND, orthopnea, edema.  ROS:  Please see the history of present illness. Aside from mentioned under HPI, all other systems are reviewed and negative.     Past Medical History:  Diagnosis Date  . Diabetes mellitus without complication (HCC)   . Hypertension     Past Surgical History:  Procedure Laterality Date  . ABDOMINAL HYSTERECTOMY    . CESAREAN SECTION    . laproscopy       reports that she has been smoking Cigarettes.  She has a 4.25 pack-year smoking history. She has never used smokeless tobacco. She reports that she does not drink alcohol or use drugs.   family history includes Diabetes in her father, maternal grandfather, and mother; Heart disease in her  father; Hyperlipidemia in her father and sister.   Outpatient Medications Prior to Visit  Medication Sig Dispense Refill  . fluticasone (FLONASE) 50 MCG/ACT nasal spray Place 2 sprays into both nostrils daily. 16 g 11  . lisinopril (PRINIVIL,ZESTRIL) 10 MG tablet Take 1 tablet (10 mg total) by mouth daily. 30 tablet 11  . meclizine (ANTIVERT) 25 MG tablet Take 1 tablet (25 mg total) by mouth 3 (three) times daily as needed for dizziness. 30 tablet 11  . metFORMIN (GLUCOPHAGE) 1000 MG tablet Take 1 tablet (1,000 mg total) by mouth 2 (two) times daily with a meal. 60 tablet 11  . Alpha-Lipoic Acid 600 MG CAPS Take 1 capsule (600 mg total) by mouth daily. (Patient not taking: Reported on 07/12/2016) 30 each 11  . glimepiride (AMARYL) 1 MG tablet Take 1 tablet (1 mg total) by mouth daily with breakfast. (Patient not taking: Reported on 07/12/2016) 30 tablet 11  . hydrochlorothiazide (HYDRODIURIL) 25 MG tablet Take 1 tablet (25 mg total) by mouth daily. (Patient not taking: Reported on 07/12/2016) 30 tablet 11  . Melatonin 5 MG CAPS Take by mouth. Takes 4 night a week     No facility-administered medications prior to visit.      Allergies: Codeine; Latex; and Provera [medroxyprogesterone acetate]    PHYSICAL EXAM: VS:  BP 118/72 (BP Location: Right Leg, Patient Position: Sitting, Cuff Size: Large)  Pulse 92   Ht 5\' 1"  (1.549 m)   Wt 234 lb 12 oz (106.5 kg)   LMP 11/20/2013 Comment: hysterectomy  BMI 44.36 kg/m  , Body mass index is 44.36 kg/m. Wt Readings from Last 3 Encounters:  07/12/16 234 lb 12 oz (106.5 kg)  07/07/16 230 lb (104.3 kg)  06/09/16 236 lb (107 kg)    GENERAL:  well developed, well nourished,obese, not in acute distress HEENT: normocephalic, pink conjunctivae, anicteric sclerae, no xanthelasma, normal dentition, oropharynx clear NECK:  no neck vein engorgement, JVP normal, no hepatojugular reflux, carotid upstroke brisk and symmetric, no bruit, no thyromegaly, no  lymphadenopathy LUNGS:  good respiratory effort, clear to auscultation bilaterally CV:  PMI not displaced, no thrills, no lifts, S1 and S2 within normal limits, no palpable S3 or S4, no murmurs, no rubs, no gallops ABD:  Soft, nontender, nondistended, normoactive bowel sounds, no abdominal aortic bruit, no hepatomegaly, no splenomegaly MS: nontender back, no kyphosis, no scoliosis, no joint deformities EXT:  2+ DP/PT pulses, trace edema, no varicosities, no cyanosis, no clubbing SKIN: warm, nondiaphoretic, normal turgor, no ulcers NEUROPSYCH: alert, oriented to person, place, and time, sensory/motor grossly intact, normal mood, appropriate affect  Recent Labs: 05/04/2016: ALT 35; BUN 10; Potassium 3.9; Sodium 135; TSH 1.41 05/25/2016: Hemoglobin 13.4; Platelets 212 07/05/2016: Creatinine, Ser 0.70   Lipid Panel    Component Value Date/Time   CHOL 154 05/04/2016 1329   CHOL 165 11/10/2015 0951   TRIG 193 (H) 05/04/2016 1329   HDL 41 (L) 05/04/2016 1329   HDL 43 11/10/2015 0951   CHOLHDL 3.8 05/04/2016 1329   VLDL 39 (H) 05/04/2016 1329   LDLCALC 74 05/04/2016 1329   LDLCALC 78 11/10/2015 0951     Other studies Reviewed:  EKG:  The ekg from07/04/2016 was personally reviewed by me and it revealed sinus rhythm 90 BPM. Otherwise unremarkable.  Additional studies/ records that were reviewed personally reviewed by me today include: Carotid ultrasound 07/05/2016: 1. Carotid bifurcations widely patent. No significant atherosclerotic vascular disease. 2. Vertebral arteries are patent with antegrade flow.   ASSESSMENT AND PLAN: Dizziness  This appears to be related to repetitive movements and changes in position as best of her head at her last position at the warehouse. Since switching jobs, this has resolved.  This is not consistent with pre-syncope. At the very least, recommend echocardiogram.  Possible claudication Recommend further evaluation with ABI and lower extremity  duplex.  Patient denies any symptoms of chest pain, chest tightness, shortness of breath. Patient advised to monitor for any onset of similar symptoms. The B likely that time to proceed with stress testing.  Hypertension BP is well controlled. Continue monitoring BP. Continue current medical therapy and lifestyle changes.    Current medicines are reviewed at length with the patient today.  The patient does not have concerns regarding medicines.  Labs/ tests ordered today include: No orders of the defined types were placed in this encounter.   I had a lengthy and detailed discussion with the patient regarding diagnoses, prognosis, diagnostic options, treatment options , and side effects of medications.   I counseled the patient on importance of lifestyle modification including heart healthy diet, regular physical activity .  Disposition:   FU with undersigned after tests  Signed, Almond LintAileen Cristle Jared, MD  07/12/2016 9:25 AM    [facility]  This note was generated in part with voice recognition software and I apologize for any typographical errors that were not detected  and corrected.

## 2016-07-12 ENCOUNTER — Encounter: Payer: Self-pay | Admitting: Cardiology

## 2016-07-12 ENCOUNTER — Ambulatory Visit (INDEPENDENT_AMBULATORY_CARE_PROVIDER_SITE_OTHER): Payer: Medicaid Other | Admitting: Cardiology

## 2016-07-12 VITALS — BP 118/72 | HR 92 | Ht 61.0 in | Wt 234.8 lb

## 2016-07-12 DIAGNOSIS — R202 Paresthesia of skin: Secondary | ICD-10-CM | POA: Diagnosis not present

## 2016-07-12 DIAGNOSIS — I739 Peripheral vascular disease, unspecified: Secondary | ICD-10-CM

## 2016-07-12 DIAGNOSIS — R42 Dizziness and giddiness: Secondary | ICD-10-CM

## 2016-07-12 DIAGNOSIS — I10 Essential (primary) hypertension: Secondary | ICD-10-CM | POA: Diagnosis not present

## 2016-07-12 NOTE — Patient Instructions (Addendum)
Testing/Procedures: Your physician has requested that you have an echocardiogram. Echocardiography is a painless test that uses sound waves to create images of your heart. It provides your doctor with information about the size and shape of your heart and how well your heart's chambers and valves are working. This procedure takes approximately one hour. There are no restrictions for this procedure.  Your physician has requested that you have an ankle brachial index (ABI). During this test an ultrasound and blood pressure cuff are used to evaluate the arteries that supply the arms and legs with blood. Allow thirty minutes for this exam. There are no restrictions or special instructions.    Follow-Up: Your physician recommends that you schedule a follow-up appointment after testing with Dr. Alvino ChapelIngal.  It was a pleasure seeing you today here in the office. Please do not hesitate to give us a call back if you have any further questions. [phone]  Masaryktown CellarPamela A. RN, BSN    Echocardiogram An echocardiogram, or echocardiography, uses sound waves (ultrasound) to produce an image of your heart. The echocardiogram is simple, painless, obtained within a short period of time, and offers valuable information to your health care provider. The images from an echocardiogram can provide information such as:  Evidence of coronary artery disease (CAD).  Heart size.  Heart muscle function.  Heart valve function.  Aneurysm detection.  Evidence of a past heart attack.  Fluid buildup around the heart.  Heart muscle thickening.  Assess heart valve function. LET [facility] PROVIDER KNOW ABOUT:  Any allergies you have.  All medicines you are taking, including vitamins, herbs, eye drops, creams, and over-the-counter medicines.  Previous problems you or members of your family have had with the use of anesthetics.  Any blood disorders you have.  Previous surgeries you have had.  Medical  conditions you have.  Possibility of pregnancy, if this applies. BEFORE THE PROCEDURE  No special preparation is needed. Eat and drink normally.  PROCEDURE   In order to produce an image of your heart, gel will be applied to your chest and a wand-like tool (transducer) will be moved over your chest. The gel will help transmit the sound waves from the transducer. The sound waves will harmlessly bounce off your heart to allow the heart images to be captured in real-time motion. These images will then be recorded.  You may need an IV to receive a medicine that improves the quality of the pictures. AFTER THE PROCEDURE You may return to your normal schedule including diet, activities, and medicines, unless your health care provider tells you otherwise.   This information is not intended to replace advice given to you by your health care provider. Make sure you discuss any questions you have with your health care provider.   Document Released: 11/03/2000 Document Revised: 11/27/2014 Document Reviewed: 07/14/2013 Elsevier Interactive Patient Education 2016 Elsevier Inc.   Ankle-Brachial Index Test The ankle-brachial index (ABI) test is used to find peripheral vascular disease (PVD). PVD is also known as peripheral arterial disease (PAD). PVD is the blocking or hardening of the arteries anywhere within the circulatory system beyond the heart. PVD is caused by cholesterol deposits in your blood vessels (atherosclerosis). These deposits cause arteries to narrow. The delivery of oxygen to your tissues is impaired as a result. This can cause muscle pain and fatigue. This is called claudication. PVD means there may also be buildup of cholesterol in your:  Heart. This increases the risk of heart attacks.  Brain.  This increases the risk of strokes. The ankle-brachial index test measures the blood flow in your arms and legs. This test also determines if blood vessels in your leg are narrowed by  cholesterol deposits. There are additional causes of a reduced ankle-brachial index, such as inflammation of vessels or a clot in the vessels. However, these are much less common than narrowing due to cholesterol deposits. HOW IS THE ANKLE-BRACHIAL INDEX TEST DONE? The test is done while you are lying down and resting. Measurements are taken of the systolic pressure:  In your arm (brachial).  In your ankle at several points along your leg. Systolic pressure is the pressure inside your arteries when your heart pumps. The measurements are taken several times on both sides. Then, the highest systolic pressure of the ankle is divided by the highest brachial systolic pressure. The result is the ankle-brachial pressure ratio, or ABI. Sometimes this test is repeated after you have exercised on a treadmill for five minutes. You may have leg pain during the exercise portion of the test if you suffer from PAD. If the index number drops after exercise, this may show that PAD is present. A normal ABI ratio is between 0.9 and 1.4. A value below 0.9 is considered abnormal.    This information is not intended to replace advice given to you by your health care provider. Make sure you discuss any questions you have with your health care provider.   Document Released: 11/10/2004 Document Revised: 11/27/2014 Document Reviewed: 06/12/2014 Elsevier Interactive Patient Education [employer]2016 Elsevier Inc.

## 2016-07-13 ENCOUNTER — Ambulatory Visit (INDEPENDENT_AMBULATORY_CARE_PROVIDER_SITE_OTHER): Payer: Medicaid Other | Admitting: Family Medicine

## 2016-07-13 VITALS — BP 116/74 | HR 84 | Temp 98.5°F | Resp 16 | Ht 61.0 in | Wt 235.6 lb

## 2016-07-13 DIAGNOSIS — I10 Essential (primary) hypertension: Secondary | ICD-10-CM

## 2016-07-13 DIAGNOSIS — R42 Dizziness and giddiness: Secondary | ICD-10-CM | POA: Diagnosis not present

## 2016-07-13 DIAGNOSIS — E1142 Type 2 diabetes mellitus with diabetic polyneuropathy: Secondary | ICD-10-CM

## 2016-07-13 MED ORDER — EXENATIDE ER 2 MG ~~LOC~~ PEN: 2.0000 mg | PEN_INJECTOR | SUBCUTANEOUS | 11 refills | Status: DC

## 2016-07-13 MED ORDER — GABAPENTIN 100 MG PO CAPS: ORAL_CAPSULE | ORAL | 3 refills | Status: DC

## 2016-07-13 MED ORDER — LISINOPRIL 5 MG PO TABS
5.0000 mg | ORAL_TABLET | Freq: Every day | ORAL | 11 refills | Status: DC
Start: 2016-07-13 — End: 2017-07-03

## 2016-07-13 NOTE — Assessment & Plan Note (Signed)
Will start Bydureon today since A1c above goal with only metformin and pt unable to tolerate glimepiride. Risks vs. Benefits and instructions on use reviewed. Will plant to check in 4 weeks and do A1c at that time.  Encourage diet and lifestyle changes.

## 2016-07-13 NOTE — Patient Instructions (Addendum)
Exenatide injection suspension, extended-release  What is this medicine?  EXENATIDE (ex EN a tide) is used to improve blood sugar control in adults with type 2 diabetes. This medicine may be used with other oral diabetes medicines.  This medicine may be used for other purposes; ask your health care provider or pharmacist if you have questions.  What should I tell my health care provider before I take this medicine?  They need to know if you have any of these conditions:  -endocrine tumors (MEN 2) or if someone in your family had these tumors  -history of pancreatitis  -kidney disease or if you are on dialysis  -stomach problems  -thyroid cancer or if someone in your family had thyroid cancer  -an unusual or allergic reaction to exenatide, medicines, foods, dyes, or preservatives  -pregnant or trying to get pregnant  -breast-feeding  How should I use this medicine?  This medicine is for injection under the skin of your upper leg, stomach area, or upper arm. It is usually given once every week (every 7 days). You will be taught how to prepare and give this medicine. Use exactly as directed. Take your medicine at regular intervals. Do not take it more often than directed.  It is important that you put your used needles and syringes in a special sharps container. Do not put them in a trash can. If you do not have a sharps container, call your pharmacist or healthcare provider to get one.  A special MedGuide will be given to you by the pharmacist with each prescription and refill. Be sure to read this information carefully each time.  Talk to your pediatrician regarding the use of this medicine in children. Special care may be needed.  Overdosage: If you think you have taken too much of this medicine contact a poison control center or emergency room at once.  NOTE: This medicine is only for you. Do not share this medicine with others.  What if I miss a dose?  If you miss a dose, take it as soon as you can, provided your  next usual scheduled dose is due at least 3 days later. If you miss a dose and your next usual scheduled dose is due 1 or 2 days later, then do not take the missed dose. Take the next dose at your regular time. Do not take double or extra doses. If you have questions about a missed dose, contact your health care provider for advice.  What may interact with this medicine?  Do not take this medicine with any of the following medications:  -gatifloxacin  This medicine may also interact with the following medications:  -acetaminophen  -birth control pills  -digoxin  -lisinopril  -lovastatin  -sulfonylureas  -warfarin  Many medications may cause changes in blood sugar, these include:  -alcohol containing beverages  -aspirin and aspirin-like drugs  -chloramphenicol  -chromium  -diuretics  -female hormones, such as estrogens or progestins, birth control pills  -heart medicines  -isoniazid  -female hormones or anabolic steroids  -medications for weight loss  -medicines for allergies, asthma, cold, or cough  -medicines for mental problems  -medicines called MAO inhibitors - Nardil, Parnate, Marplan, Eldepryl  -niacin  -NSAIDS, such as ibuprofen  -pentamidine  -phenytoin  -probenecid  -quinolone antibiotics such as ciprofloxacin, levofloxacin, ofloxacin  -some herbal dietary supplements  -steroid medicines such as prednisone or cortisone  -thyroid hormones  Some medications can hide the warning symptoms of low blood sugar (hypoglycemia). You may   need to monitor your blood sugar more closely if you are taking one of these medications. These include:  -beta-blockers, often used for high blood pressure or heart problems (examples include atenolol, metoprolol, propranolol)  -clonidine  -guanethidine  -reserpine  This list may not describe all possible interactions. Give your health care provider a list of all the medicines, herbs, non-prescription drugs, or dietary supplements you use. Also tell them if you smoke, drink alcohol, or  use illegal drugs. Some items may interact with your medicine.  What should I watch for while using this medicine?  Visit your doctor or health care professional for regular checks on your progress.  A test called the HbA1C (A1C) will be monitored. This is a simple blood test. It measures your blood sugar control over the last 2 to 3 months. You will receive this test every 3 to 6 months.  Learn how to check your blood sugar. Learn the symptoms of low and high blood sugar and how to manage them.  Always carry a quick-source of sugar with you in case you have symptoms of low blood sugar. Examples include hard sugar candy or glucose tablets. Make sure others know that you can choke if you eat or drink when you develop serious symptoms of low blood sugar, such as seizures or unconsciousness. They must get medical help at once.  Tell your doctor or health care professional if you have high blood sugar. You might need to change the dose of your medicine. If you are sick or exercising more than usual, you might need to change the dose of your medicine.  Do not skip meals. Ask your doctor or health care professional if you should avoid alcohol. Many nonprescription cough and cold products contain sugar or alcohol. These can affect blood sugar.  Exenatide pens and cartridges should never be shared. Even if the needle is changed, sharing may result in passing of viruses like hepatitis or HIV.  Wear a medical ID bracelet or chain, and carry a card that describes your disease and details of your medicine and dosage times.  What side effects may I notice from receiving this medicine?  Side effects that you should report to your doctor or health care professional as soon as possible:  -allergic reactions like skin rash, itching or hives, swelling of the face, lips, or tongue  -breathing problems  -signs and symptoms of low blood sugar such as feeling anxious, confusion, dizziness, increased hunger, unusually weak or tired,  sweating, shakiness, cold, irritable, headache, blurred vision, fast heartbeat, loss of consciousness  -swelling of the ankles, feet, hands  -trouble passing urine or change in the amount of urine  -unusual stomach pain or upset  -unusually weak or tired  -vomiting  Side effects that usually do not require medical attention (report to your doctor or health care professional if they continue or are bothersome):  -constipation  -diarrhea  -dizziness  -headache  -heartburn  -itching, irritation, or redness at site where injected.  -nausea  This list may not describe all possible side effects. Call your doctor for medical advice about side effects. You may report side effects to FDA at 1-800-FDA-1088.  Where should I keep my medicine?  Keep out of the reach of children.  Store this medicine in a refrigerator between 2 and 8 degrees C (36 and 46 degrees F). Do not freeze or use if the medicine has been frozen. Protect from light and excessive heat. Each single-dose tray can be kept at   a temperature not to exceed 25 degrees C (77 degrees F) for no more than a total of 4 weeks, if needed. Throw away any unused medicine after the expiration date.  NOTE: This sheet is a summary. It may not cover all possible information. If you have questions about this medicine, talk to your doctor, pharmacist, or health care provider.     © 2016, Elsevier/[employer]. (2014-01-15 10:23:19)

## 2016-07-13 NOTE — Progress Notes (Signed)
Subjective:    Patient ID: Carol AxeMisty L Pund, female    DOB: 06/20/1976, 40 y.o.   MRN: [id number]  HPI: Carol Collins is a 40 y.o. female presenting on 07/13/2016 for Dizziness   HPI  Pt presents for follow-up of dizziness and hypotension. She saw cardiology- agreed it was inner ear related. Cards did order an echo and ABI to r/o PAD.  ENT has set up her with vestibular rehab Diabetes: Stopped glimepiride due to dizziness. She is failing metformin with A1c of 8.1%. Numbness and tingling in the feet is worsening. Not improved with alpha lipoic acid.  She has never tried gabapentin. Would like to try another medication.   Past Medical History:  Diagnosis Date  . Diabetes mellitus without complication (HCC)   . Hypertension     Current Outpatient Prescriptions on File Prior to Visit  Medication Sig  . fluticasone (FLONASE) 50 MCG/ACT nasal spray Place 2 sprays into both nostrils daily.  . meclizine (ANTIVERT) 25 MG tablet Take 1 tablet (25 mg total) by mouth 3 (three) times daily as needed for dizziness.  . metFORMIN (GLUCOPHAGE) 1000 MG tablet Take 1 tablet (1,000 mg total) by mouth 2 (two) times daily with a meal.   No current facility-administered medications on file prior to visit.     Review of Systems  Constitutional: Negative for chills and fever.  HENT: Negative.   Respiratory: Negative for cough, chest tightness and wheezing.   Cardiovascular: Negative for chest pain and leg swelling.  Gastrointestinal: Negative for abdominal pain, constipation, diarrhea, nausea and vomiting.  Endocrine: Negative.  Negative for cold intolerance, heat intolerance, polydipsia, polyphagia and polyuria.  Genitourinary: Negative for difficulty urinating and dysuria.  Musculoskeletal: Negative.   Neurological: Positive for dizziness and numbness. Negative for light-headedness.  Psychiatric/Behavioral: Negative.    Per HPI unless specifically indicated above     Objective:    BP 116/74 (BP  Location: Left Arm, Patient Position: Sitting, Cuff Size: Normal)   Pulse 84   Temp 98.5 F (36.9 C) (Oral)   Resp 16   Ht 5\' 1"  (1.549 m)   Wt 235 lb 9.6 oz (106.9 kg)   LMP 11/20/2013 Comment: hysterectomy  BMI 44.52 kg/m   Wt Readings from Last 3 Encounters:  07/13/16 235 lb 9.6 oz (106.9 kg)  07/12/16 234 lb 12 oz (106.5 kg)  07/07/16 230 lb (104.3 kg)    Physical Exam  Constitutional: She is oriented to person, place, and time. She appears well-developed and well-nourished.  HENT:  Head: Normocephalic and atraumatic.  Neck: Neck supple.  Cardiovascular: Normal rate, regular rhythm and normal heart sounds.  Exam reveals no gallop and no friction rub.   No murmur heard. Pulmonary/Chest: Effort normal and breath sounds normal. She has no wheezes. She exhibits no tenderness.  Abdominal: Soft. Normal appearance and bowel sounds are normal. She exhibits no distension and no mass. There is no tenderness. There is no rebound and no guarding.  Musculoskeletal: Normal range of motion. She exhibits no edema or tenderness.  Lymphadenopathy:    She has no cervical adenopathy.  Neurological: She is alert and oriented to person, place, and time.  Skin: Skin is warm and dry.   Results for orders placed or performed during the hospital encounter of 07/05/16  I-STAT creatinine  Result Value Ref Range   Creatinine, Ser 0.70 0.44 - 1.00 mg/dL      Assessment & Plan:   Problem List Items Addressed This Visit  Cardiovascular and Mediastinum   BP (high blood pressure)    Reduce lisinopril to 5mg  for renal protection and to prevent hypotension.       Relevant Medications   lisinopril (PRINIVIL,ZESTRIL) 5 MG tablet     Endocrine   Diabetes (HCC) - Primary    Will start Bydureon today since A1c above goal with only metformin and pt unable to tolerate glimepiride. Risks vs. Benefits and instructions on use reviewed. Will plant to check in 4 weeks and do A1c at that time.  Encourage  diet and lifestyle changes.       Relevant Medications   lisinopril (PRINIVIL,ZESTRIL) 5 MG tablet   Exenatide ER 2 MG PEN   gabapentin (NEURONTIN) 100 MG capsule     Other   Dizziness    Appears to be inner ear related. Continue to follow with ENT.        Other Visit Diagnoses   None.     Meds ordered this encounter  Medications  . lisinopril (PRINIVIL,ZESTRIL) 5 MG tablet    Sig: Take 1 tablet (5 mg total) by mouth daily.    Dispense:  30 tablet    Refill:  11    Order Specific Question:   Supervising Provider    Answer:   Janeann ForehandHAWKINS JR, JAMES H [phone][[id number]]  . Exenatide ER 2 MG PEN    Sig: Inject 2 mg into the skin once a week.    Dispense:  4 each    Refill:  11    Order Specific Question:   Supervising Provider    Answer:   Janeann ForehandHAWKINS JR, JAMES H [phone][[id number]]  . gabapentin (NEURONTIN) 100 MG capsule    Sig: Take 1 pill at bedtime for 1 week, then increase to 1 pill AM and 1 pill bedtime for week; Increase to 1 pill AM, 1 pill PM, 1 pill bedtime    Dispense:  90 capsule    Refill:  3    Order Specific Question:   Supervising Provider    Answer:   Janeann ForehandHAWKINS JR, JAMES H [[id number]]      Follow up plan: Return in about 4 weeks (around 08/10/2016), or if symptoms worsen or fail to improve, for Diabetes. .Marland Kitchen

## 2016-07-13 NOTE — Assessment & Plan Note (Signed)
Reduce lisinopril to 5mg  for renal protection and to prevent hypotension.

## 2016-07-13 NOTE — Assessment & Plan Note (Signed)
Appears to be inner ear related. Continue to follow with ENT.

## 2016-08-01 ENCOUNTER — Other Ambulatory Visit: Payer: Medicaid Other

## 2016-08-07 ENCOUNTER — Ambulatory Visit: Payer: Medicaid Other | Admitting: Cardiology

## 2016-08-09 ENCOUNTER — Ambulatory Visit: Payer: Medicaid Other | Admitting: Family Medicine

## 2016-08-15 ENCOUNTER — Ambulatory Visit (INDEPENDENT_AMBULATORY_CARE_PROVIDER_SITE_OTHER): Payer: Medicaid Other | Admitting: Family Medicine

## 2016-08-15 ENCOUNTER — Encounter: Payer: Self-pay | Admitting: Family Medicine

## 2016-08-15 VITALS — BP 136/79 | HR 85 | Temp 98.6°F | Resp 16 | Ht 61.0 in | Wt 229.6 lb

## 2016-08-15 DIAGNOSIS — R748 Abnormal levels of other serum enzymes: Secondary | ICD-10-CM

## 2016-08-15 DIAGNOSIS — E1169 Type 2 diabetes mellitus with other specified complication: Secondary | ICD-10-CM | POA: Insufficient documentation

## 2016-08-15 DIAGNOSIS — R42 Dizziness and giddiness: Secondary | ICD-10-CM | POA: Diagnosis not present

## 2016-08-15 DIAGNOSIS — I10 Essential (primary) hypertension: Secondary | ICD-10-CM

## 2016-08-15 DIAGNOSIS — R600 Localized edema: Secondary | ICD-10-CM | POA: Diagnosis not present

## 2016-08-15 DIAGNOSIS — F17200 Nicotine dependence, unspecified, uncomplicated: Secondary | ICD-10-CM

## 2016-08-15 DIAGNOSIS — Z720 Tobacco use: Secondary | ICD-10-CM | POA: Diagnosis not present

## 2016-08-15 DIAGNOSIS — E1142 Type 2 diabetes mellitus with diabetic polyneuropathy: Secondary | ICD-10-CM

## 2016-08-15 DIAGNOSIS — E785 Hyperlipidemia, unspecified: Secondary | ICD-10-CM

## 2016-08-15 LAB — POCT GLYCOSYLATED HEMOGLOBIN (HGB A1C): Hemoglobin A1C: 7.1

## 2016-08-15 MED ORDER — FUROSEMIDE 20 MG PO TABS: ORAL_TABLET | ORAL | 5 refills | Status: DC

## 2016-08-15 NOTE — Assessment & Plan Note (Signed)
Controlled. Continue lisinopril only for renal protection. Continue current regimen. Will be due for BMP at next visit.

## 2016-08-15 NOTE — Assessment & Plan Note (Addendum)
Good A1c response to 1 mos Bydureon. Continue with metformin. Encouraged diet and lifestyle changes. Pain improved with gabapentin, pt would like to continue at current dose.  Eye exam: Due Feb 2018 Foot exam: UTD UA microalbumin: June 2017.  Return 3 mos.

## 2016-08-15 NOTE — Assessment & Plan Note (Signed)
Discussed statin therapy with patient. She would like to wait to start statin until December. She is concerned about starting new medication with her current procedures. She is making diet and lifestyle changes. Some weight loss with Bydureon. Encouraged smoking cessation to reduce cardiovascular risk.  Recheck lipid panel in 3 mos. Plan to start statin at that time.

## 2016-08-15 NOTE — Patient Instructions (Signed)
Keep up the good work with your diabetes. Your A1c is doing well. I think we will be below goal at your next visit.   Take lasix as needed for foot swelling.   Your goal blood pressure is 140/90 Work on low salt/sodium diet - goal <1.5gm (1,500mg ) per day. Eat a diet high in fruits/vegetables and whole grains.  Look into mediterranean and DASH diet. Goal activity is 12650min/wk of moderate intensity exercise.  This can be split into 30 minute chunks.  If you are not at this level, you can start with smaller 10-15 min increments and slowly build up activity. Look at www.heart.org for more resources

## 2016-08-15 NOTE — Progress Notes (Signed)
Subjective:    Patient ID: Carol Collins, female    DOB: Jul 28, 1976, 40 y.o.   MRN: [id number]  HPI: Carol Collins is a 40 y.o. female presenting on 08/15/2016 for Diabetes (avarage about 150)   HPI  Pt presents for recheck of diabetes. Doing well with bydureon. Some nausea/ woozy after shot (20 minutes) otherwise doing well. Avg sugars are 150. Had some weight loss (6lbs). Has done 3 total shots. Numbness is improved with gabapentin- pain is much improved. Feels that 100mg  TID is the right dose. Pain much  BP doing well.  Dizziness is improved- Dr. Andee Poles thinks it is neurology related. She is scheduled to see [facility] Neuro in December. Since changing jobs- dizziness is no longer bothersome. Will get tubes placed for hearing issues.    Past Medical History:  Diagnosis Date  . Diabetes mellitus without complication (HCC)   . Hypertension     Current Outpatient Prescriptions on File Prior to Visit  Medication Sig  . Exenatide ER 2 MG PEN Inject 2 mg into the skin once a week.  . fluticasone (FLONASE) 50 MCG/ACT nasal spray Place 2 sprays into both nostrils daily.  Marland Kitchen gabapentin (NEURONTIN) 100 MG capsule Take 1 pill at bedtime for 1 week, then increase to 1 pill AM and 1 pill bedtime for week; Increase to 1 pill AM, 1 pill PM, 1 pill bedtime  . lisinopril (PRINIVIL,ZESTRIL) 5 MG tablet Take 1 tablet (5 mg total) by mouth daily.  . metFORMIN (GLUCOPHAGE) 1000 MG tablet Take 1 tablet (1,000 mg total) by mouth 2 (two) times daily with a meal.   No current facility-administered medications on file prior to visit.     Review of Systems  Constitutional: Negative for chills and fever.  HENT: Negative.   Respiratory: Negative for cough, chest tightness and wheezing.   Cardiovascular: Negative for chest pain and leg swelling.  Gastrointestinal: Negative for abdominal pain, constipation, diarrhea, nausea and vomiting.  Endocrine: Negative.  Negative for cold intolerance, heat intolerance,  polydipsia, polyphagia and polyuria.  Genitourinary: Negative for difficulty urinating and dysuria.  Musculoskeletal: Negative.   Neurological: Negative for dizziness, light-headedness and numbness.  Psychiatric/Behavioral: Negative.    Per HPI unless specifically indicated above     Objective:    BP 136/79   Pulse 85   Temp 98.6 F (37 C) (Oral)   Resp 16   Ht 5\' 1"  (1.549 m)   Wt 229 lb 9.6 oz (104.1 kg)   LMP 11/20/2013 Comment: hysterectomy  BMI 43.38 kg/m   Wt Readings from Last 3 Encounters:  08/15/16 229 lb 9.6 oz (104.1 kg)  07/13/16 235 lb 9.6 oz (106.9 kg)  07/12/16 234 lb 12 oz (106.5 kg)    Physical Exam  Constitutional: She is oriented to person, place, and time. She appears well-developed and well-nourished.  HENT:  Head: Normocephalic and atraumatic.  Neck: Neck supple.  Cardiovascular: Normal rate, regular rhythm and normal heart sounds.  Exam reveals no gallop and no friction rub.   No murmur heard. Pulmonary/Chest: Effort normal and breath sounds normal. She has no wheezes. She exhibits no tenderness.  Abdominal: Soft. Normal appearance and bowel sounds are normal. She exhibits no distension and no mass. There is no tenderness. There is no rebound and no guarding.  Musculoskeletal: Normal range of motion. She exhibits no edema or tenderness.  Lymphadenopathy:    She has no cervical adenopathy.  Neurological: She is alert and oriented to person, place, and time.  Skin: Skin is warm and dry.   Results for orders placed or performed in visit on 08/15/16  POCT HgB A1C  Result Value Ref Range   Hemoglobin A1C 7.1   HM DIABETES EYE EXAM  Result Value Ref Range   HM Diabetic Eye Exam No Retinopathy No Retinopathy      Assessment & Plan:   Problem List Items Addressed This Visit      Cardiovascular and Mediastinum   BP (high blood pressure)    Controlled. Continue lisinopril only for renal protection. Continue current regimen. Will be due for BMP at  next visit.       Relevant Medications   furosemide (LASIX) 20 MG tablet     Nervous and Auditory   DM type 2 with diabetic peripheral neuropathy (HCC) - Primary    Good A1c response to 1 mos Bydureon. Continue with metformin. Encouraged diet and lifestyle changes. Pain improved with gabapentin, pt would like to continue at current dose.  Eye exam: Due Feb 2018 Foot exam: UTD UA microalbumin: June 2017.  Return 3 mos.         Other   Smoker    Encouraged smoking cessation.       Pedal edema    PRN lasix to help with swelling since HCTZ exacerbated dizziness.       Relevant Medications   furosemide (LASIX) 20 MG tablet   Dizziness    Likely 2/2 neurologic issues per ENT. Pt is being followed by Dr.Vaught and will have follow-up with neurology in December. Dizziness is still present but not as bothersome due to patient job switch. Encouraged continued coping measures- such as careful position changing.        Hyperlipidemia associated with type 2 diabetes mellitus (HCC)    Discussed statin therapy with patient. She would like to wait to start statin until December. She is concerned about starting new medication with her current procedures. She is making diet and lifestyle changes. Some weight loss with Bydureon. Encouraged smoking cessation to reduce cardiovascular risk.  Recheck lipid panel in 3 mos. Plan to start statin at that time.       Relevant Medications   furosemide (LASIX) 20 MG tablet    Other Visit Diagnoses    Elevated liver enzymes       Relevant Orders   Hepatic function panel      Meds ordered this encounter  Medications  . furosemide (LASIX) 20 MG tablet    Sig: Take 1/2 to 1 pill by mouth as needed for swelling.    Dispense:  30 tablet    Refill:  5    Order Specific Question:   Supervising Provider    Answer:   Janeann ForehandHAWKINS JR, JAMES H [[id number]]      Follow up plan: Return in about 3 months (around 11/14/2016), or if symptoms worsen or fail to  improve, for Diabetes. .Marland Kitchen

## 2016-08-15 NOTE — Assessment & Plan Note (Signed)
Encouraged smoking cessation 

## 2016-08-15 NOTE — Assessment & Plan Note (Signed)
Likely 2/2 neurologic issues per ENT. Pt is being followed by Dr.Vaught and will have follow-up with neurology in December. Dizziness is still present but not as bothersome due to patient job switch. Encouraged continued coping measures- such as careful position changing.

## 2016-08-15 NOTE — Assessment & Plan Note (Signed)
PRN lasix to help with swelling since HCTZ exacerbated dizziness.

## 2016-08-18 ENCOUNTER — Ambulatory Visit: Payer: Medicaid Other | Admitting: Physical Therapy

## 2016-08-24 ENCOUNTER — Encounter
Admission: RE | Admit: 2016-08-24 | Discharge: 2016-08-24 | Disposition: A | Discharge status: Home / Self Care | Payer: Medicaid Other | Source: Ambulatory Visit | Attending: Otolaryngology | Admitting: Otolaryngology

## 2016-08-24 DIAGNOSIS — Z01812 Encounter for preprocedural laboratory examination: Secondary | ICD-10-CM | POA: Insufficient documentation

## 2016-08-24 HISTORY — DX: Polyneuropathy, unspecified: G62.9

## 2016-08-24 LAB — BASIC METABOLIC PANEL
ANION GAP: 10 (ref 5–15)
BUN: 10 mg/dL (ref 6–20)
CALCIUM: 8.7 mg/dL — AB (ref 8.9–10.3)
CO2: 26 mmol/L (ref 22–32)
CREATININE: 0.82 mg/dL (ref 0.44–1.00)
Chloride: 100 mmol/L — ABNORMAL LOW (ref 101–111)
Glucose, Bld: 202 mg/dL — ABNORMAL HIGH (ref 65–99)
Potassium: 3.4 mmol/L — ABNORMAL LOW (ref 3.5–5.1)
SODIUM: 136 mmol/L (ref 135–145)

## 2016-08-24 NOTE — Patient Instructions (Signed)
  Your procedure is scheduled on: August 31, 2016 (Thursday) Report to Same Day Surgery 2nd floor Medical  [facility] To find out your arrival time please call [phone] between 1PM - 3PM on August 30, 2016 (Wednesday)  Remember: Instructions that are not followed completely may result in serious medical risk, up to and including death, or upon the discretion of your surgeon and anesthesiologist your surgery may need to be rescheduled.    _x___ 1. Do not eat food or drink liquids after midnight. No gum chewing or hard candies.     _x_  __ 2. No Alcohol for 24 hours before or after surgery.   _x      _3. No Smoking for 24 prior to surgery.   ____  4. Bring all medications with you on the day of surgery if instructed.    __x__ 5. Notify your doctor if there is any change in your medical condition     (cold, fever, infections).     Do not wear jewelry, make-up, hairpins, clips or nail polish.  Do not wear lotions, powders, or perfumes. You may wear deodorant.  Do not shave 48 hours prior to surgery. Men may shave face and neck.  Do not bring valuables to the hospital.    [facility] is not responsible for any belongings or valuables.               Contacts, dentures or bridgework may not be worn into surgery.  Leave your suitcase in the car. After surgery it may be brought to your room.  For patients admitted to the hospital, discharge time is determined by your treatment team.   Patients discharged the day of surgery will not be allowed to drive home.    Please read over the following fact sheets that you were given:   [facility] Preparing for Surgery and or MRSA Information   _x___ Take these medicines the morning of surgery with A SIP OF WATER:    1. Gabapentin  2. Lisinopril  3.  4.  5.  6.  ____Fleets enema or Magnesium Citrate as directed.   ___ Use CHG Soap or sage wipes as directed on instruction sheet   ____ Use inhalers on the day of surgery and bring to hospital  day of surgery  _x___ Stop metformin 2 days prior to surgery (Stop Metformin on October 10 )    ____ Take 1/2 of usual insulin dose the night before surgery and none on the morning of  surgery.           _x___ Stop aspirin or coumadin, or plavix (NO ASPIRIN)  x__ Stop Anti-inflammatories such as Advil, Aleve, Ibuprofen, Motrin, Naproxen,          Naprosyn, Goodies powders or aspirin products. Ok to take Tylenol.   ____ Stop supplements until after surgery.    ____ Bring C-Pap to the hospital.

## 2016-08-30 ENCOUNTER — Encounter: Payer: Self-pay | Admitting: Physical Therapy

## 2016-08-30 ENCOUNTER — Ambulatory Visit: Payer: Medicaid Other | Attending: Otolaryngology

## 2016-08-30 DIAGNOSIS — R42 Dizziness and giddiness: Secondary | ICD-10-CM

## 2016-08-30 NOTE — Therapy (Signed)
[place] [facility] MAIN [facility] SERVICES [address] [place], [place], [address] Phone: [phone]   Fax:  [phone]  Physical Therapy Evaluation  Patient Details  Name: Carol Collins MRN: [id number] Date of Birth: [birth year] Referring Provider: Bud Face  Encounter Date: 08/30/2016      PT End of Session - 08/30/16 1020    Visit Number 1   Number of Visits 1   Date for PT Re-Evaluation 09/06/16   Authorization Type Medicaid   PT Start Time 1020   PT Stop Time 1120   PT Time Calculation (min) 60 min   Activity Tolerance Patient tolerated treatment well   Behavior During Therapy [facility] for tasks assessed/performed      Past Medical History:  Diagnosis Date  . Diabetes mellitus without complication (HCC)   . Hypertension   . Neuropathy [facility])     Past Surgical History:  Procedure Laterality Date  . ABDOMINAL HYSTERECTOMY    . CESAREAN SECTION    . DIAGNOSTIC LAPAROSCOPY    . laproscopy    . MYRINGOTOMY WITH TUBE PLACEMENT Bilateral 08/31/2016   Procedure: MYRINGOTOMY WITH TUBE PLACEMENT;  Surgeon: Bud Face, MD;  Location: [facility] ORS;  Service: ENT;  Laterality: Bilateral;    There were no vitals filed for this visit.       Subjective Assessment - 08/30/16 1202    Subjective Dizziness   Pertinent History Pt reports she is having dizziness with bending and climbing ladders. Symptoms have been going on for at least 1.5 to 2 years. Pt changed jobs because these activities constituted a large part of her job responsibilities. She reports that her dizziness improved after changing jobs. Pt reports that the dizziness typically lasts somewhere between a few minutes to a few hours.  Pt describes the symptoms as feeling "lightheaded with tunnel vision." Pt denies any new onset N/T but has a history of bilateral LE neuropathy. No history of head trauma. Pt has seen ENT who reports that her dizziness is not related to her inner ear.  VNG revealed abnormal horizontal saccades that are consistent with possible central involvement. He referred her to neurology however her appointment is not until December. Pt also had an MRI and carotid ultrasound both of which were WNL. Pt saw a cardiologist who reported that her dizziness is not cardiac related.   Patient Stated Goals Decrease symptoms   Currently in Pain? No/denies        VESTIBULAR AND BALANCE EVALUATION  Onset Date: Approximately 2 years ago.  HISTORY:  Subjective history of current problem: Pt reports she is having dizziness with bending and climbing ladders. Symptoms have been going on for at least 1.5 to 2 years. Pt changed jobs because these activities constituted a large part of her job responsibilities. She reports that her dizziness improved after changing jobs. Pt reports that the dizziness typically lasts somewhere between a few minutes to a few hours.  Pt describes the symptoms as feeling "lightheaded with tunnel vision." Pt denies any new onset N/T but has a history of bilateral LE neuropathy. No history of head trauma. Pt has seen ENT who reports that her dizziness is not related to her inner ear. VNG revealed abnormal horizontal saccades that are consistent with possible central involvement. He referred her to neurology however her appointment is not until December. Pt also had an MRI and carotid ultrasound both of which were WNL. Pt saw a cardiologist who reported that her dizziness is not cardiac related.  Description of dizziness: (vertigo, unsteadiness, lightheadedness, falling, general unsteadiness, aural fullness): "lightheaded and sometimes with tunnel vision."  Frequency: Multiple times/day Duration: minutes to hours Symptom nature: (motion provoked/positional/spontaneous/constant, variable, intermittent) motion provoked, intermittent. Never occurs spontaneously.   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 Pt denies pain or drainage. Pt is having tubes placed tomorrow to help with her hearing but is also planning to get hearing aids.   Vision (last eye exam, diplopia, recent changes): Denies. Last vision check January/February with change in prescription (no change in dizziness). Pt reports her vision has improved since they updated her eye glass prescription.   Current Symptoms: Denies: vertigo, N & V, dysarthria, dysphagia, drop attacks, bowel and bladder changes, recent weight loss/gain, rocking, general unsteadiness, imbalance, oscillopsia, migraines. Confirms: dizziness,  headaches (1x/month), no migraines  EXAMINATION  POSTURE: Forward head, rounded shoulders. Increased waist to hip ratio.  NEUROLOGICAL SCREEN: (2+ unless otherwise noted.) N=normal  Ab=abnormal  Level Dermatome R L Myotome R L Reflex R L  C3 Anterior Neck N N Sidebend C2-3 N N Jaw CN V    C4 Top of Shoulder N N Shoulder Shrug C4 N N Hoffman's UMN    C5 Lateral Upper Arm N N Shoulder ABD C4-5 N N Biceps C5-6    C6 Lateral Arm/ Thumb N N Arm Flex/ Wrist Ext C5-6 N N Brachiorad. C5-6    C7 Middle Finger N N Arm Ext//Wrist Flex C6-7 N N Triceps C7    C8 4th & 5th Finger N N Flex/ Ext Carpi Ulnaris C8 N N Patella    T1 Medial Arm N N Interossei T1 N N Gastrocnemius    L2 Medial thigh/groin N N Illiopsoas (L2-3) N N     L3 Lower thigh/med.knee N N Quadriceps (L3-4) N N Patellar (L3-4)    L4 Medial leg/lat thigh N N Tibialis Ant (L4-5) N N     L5 Lat. leg & dorsal foot N N EHL (L5) N N     S1 post/lat foot/thigh/leg N N  Gastrocnemius (S1-2) N N Gastrocnemius (S1)    S2 Post./med. thigh & leg N N Hamstrings (L4-S3) N N Babinski     Reflex testing deferred  SOMATOSENSORY:         Sensation           Intact      Diminished         Absent  Light touch Normal    Any N & T in extremities or weakness: None      COORDINATION: Finger to Nose:Normal Pronator Drift: Normal   MUSCULOSKELETAL SCREEN: Cervical Spine ROM: WNL, painless    ROM: grossly WFL  MMT: WFL  Functional Mobility: Independent for transfers and ambulation without assistive device  Gait: Scanning of visual environment with gait is: Good.   Balance: Pt able to maintain tandem balance without UE support and without LOB.    OCULOMOTOR / VESTIBULAR TESTING:  Oculomotor Exam- Room Light  Normal Abnormal Comments  Ocular Alignment N    Ocular ROM N    Spontaneous Nystagmus N    End-Gaze Nystagmus N  Age  appropriate  Smooth Pursuit N    Saccades N    VOR N    VOR Cancellation  A Denies dizziness, reports some doubling or "trailing" of her thumb  Left Head Thrust  A Positive bilaterally but difficult to see because of startle and pt closes her eyes  Right Head Thrust  A   Head Shaking Nystagmus     Static Acuity     Dynamic Acuity       Oculomotor Exam- Fixation Suppressed  Normal Abnormal Comments  Ocular Alignment N    Ocular ROM N    Spontaneous Nystagmus N    End-Gaze Nystagmus N    Left Head Thrust     Right Head Thrust     Head Shaking Nystagmus N      BPPV TESTS:  Symptoms Duration Intensity Nystagmus  L Dix-Hallpike None   None  R Dix-Hallpike None   None  L Head Roll None   None  R Head Roll None   None  L Sidelying Test      R Sidelying Test        FUNCTIONAL OUTCOME MEASURES:  Results Comments  DHI Pt didn't complete   ABC Scale Pt didn't complete   DGI    10 meter Walking Speed >1.2 m/s WNL    Orthostatic vitals: Supine: BP: 128/69, HR: 87,  Sitting: BP: 118/64, HR: 90 Standing: BP: 100/76,  HR: 95   Performed full HEP with patient in order to ensure compliance and proper technique.              [facility] PT Assessment - 08/30/16 1142      Assessment   Medical Diagnosis Dizziness   Referring Provider Vaught, Roney Mans   Onset Date/Surgical Date 08/30/14   Next MD Visit Neurology appt in December   Prior Therapy None     Precautions   Precautions None     Restrictions   Weight Bearing Restrictions No     Balance Screen   Has the patient fallen in the past 6 months No   Has the patient had a decrease in activity level because of a fear of falling?  Yes   Is the patient reluctant to leave their home because of a fear of falling?  No     Home Environment   Living Environment Private residence   Living Arrangements Spouse/significant other;Children   Available Help at Discharge Family   Type of Home House   Home Access Stairs to enter   Entrance Stairs-Number of Steps 6   Entrance Stairs-Rails --  Middle   Home Layout Two level   Alternate Level Stairs-Number of Steps ?   Alternate Level Stairs-Rails Left     Prior Function   Level of Independence Independent     Cognition   Overall Cognitive Status Within Functional Limits for tasks assessed                           PT Education - 08/30/16 1019    Education provided Yes   Education Details HEP, plan of care, discharge   Person(s) Educated Patient   Methods Explanation;Demonstration;Verbal cues;Handout   Comprehension Verbalized understanding;Returned demonstration             PT Long Term Goals - 08/30/16 1021      PT LONG TERM GOAL #1   Title Pt will be independent with HEP in order to improve strength and balance in  order to decrease fall risk and improve function at home and work.    Time 1   Period Weeks   Status Achieved               Plan - 08/30/16 1020    Clinical Impression Statement Pt referred for vestibular physical therapy from ENT with  diagnosis of dizziness. VNG at ENT office revealed abnormal horizontal saccades indicating possible central etiology. History also supports central etiology of symptoms. Pt has a neurology appointment in December to follow-up regarding dizziness.  PT evaluation is relatively unremarkable with the exception of dizziness with position changes. However orthostatic vitals are negative on this date. Pt has seen a cardiologist who reports that her symptoms are not cardiac related. Unfortunately, Medicaid will not pay for any follow-up physical therapy treatment session. Pt was issued extensive HEP with instructions about how to progress difficulty of exercises. She will be discharged at this time.    PT Frequency One time visit   PT Duration --  1 week   PT Treatment/Interventions Canalith Repostioning;Gait training;Therapeutic activities;Therapeutic exercise;Balance training;Neuromuscular re-education;Patient/family education;Manual techniques;Vestibular   PT Next Visit Plan Discharge   PT Home Exercise Plan VOR x 1 horizontal, tandem stance with head turns, brandt-daroff exercises, horizontal saccades, smooth pursuit   Consulted and Agree with Plan of Care Patient      Patient will benefit from skilled therapeutic intervention in order to improve the following deficits and impairments:  Dizziness  Visit Diagnosis: Dizziness and giddiness - Plan: PT plan of care cert/re-cert     Problem List Patient Active Problem List   Diagnosis Date Noted  . Hyperlipidemia associated with type 2 diabetes mellitus (HCC) 08/15/2016  . Dizziness 05/04/2016  . Obesity, Class III, BMI 40-49.9 (morbid obesity) (HCC) 12/03/2015  . Pedal edema 11/09/2015  . Iron deficiency anemia 10/08/2015  . DM type 2 with diabetic peripheral neuropathy (HCC) 10/08/2015  . Hyperesthesia 10/08/2015  . BP (high blood pressure) 10/08/2015  . Allergic rhinitis due to pollen 10/08/2015  . Smoker 10/08/2015   Lynnea MaizesJason D Huprich PT,  DPT   Huprich,Jason 08/31/2016, 4:28 PM  [facility] [address] [place], [place], [address] Phone: [phone]   Fax:  [phone]  Name: Glendon AxeMisty L Kirlin MRN: [id number] Date of Birth: 01/30/1976

## 2016-08-31 ENCOUNTER — Encounter: Payer: Self-pay | Admitting: *Deleted

## 2016-08-31 ENCOUNTER — Ambulatory Visit
Admission: RE | Admit: 2016-08-31 | Discharge: 2016-08-31 | Disposition: A | Discharge status: Home / Self Care | Payer: Medicaid Other | Source: Ambulatory Visit | Attending: Otolaryngology | Admitting: Otolaryngology

## 2016-08-31 ENCOUNTER — Encounter
Admission: RE | Disposition: A | Discharge status: Home / Self Care | Payer: Self-pay | Source: Ambulatory Visit | Attending: Otolaryngology

## 2016-08-31 ENCOUNTER — Ambulatory Visit: Payer: Medicaid Other | Admitting: Anesthesiology

## 2016-08-31 DIAGNOSIS — F17200 Nicotine dependence, unspecified, uncomplicated: Secondary | ICD-10-CM | POA: Diagnosis not present

## 2016-08-31 DIAGNOSIS — H9190 Unspecified hearing loss, unspecified ear: Secondary | ICD-10-CM | POA: Diagnosis present

## 2016-08-31 DIAGNOSIS — Z7984 Long term (current) use of oral hypoglycemic drugs: Secondary | ICD-10-CM | POA: Insufficient documentation

## 2016-08-31 DIAGNOSIS — Z6841 Body Mass Index (BMI) 40.0 and over, adult: Secondary | ICD-10-CM | POA: Insufficient documentation

## 2016-08-31 DIAGNOSIS — H6980 Other specified disorders of Eustachian tube, unspecified ear: Secondary | ICD-10-CM | POA: Diagnosis not present

## 2016-08-31 DIAGNOSIS — I10 Essential (primary) hypertension: Secondary | ICD-10-CM | POA: Diagnosis not present

## 2016-08-31 DIAGNOSIS — E119 Type 2 diabetes mellitus without complications: Secondary | ICD-10-CM | POA: Insufficient documentation

## 2016-08-31 DIAGNOSIS — Z7951 Long term (current) use of inhaled steroids: Secondary | ICD-10-CM | POA: Diagnosis not present

## 2016-08-31 DIAGNOSIS — Z79899 Other long term (current) drug therapy: Secondary | ICD-10-CM | POA: Diagnosis not present

## 2016-08-31 HISTORY — PX: MYRINGOTOMY WITH TUBE PLACEMENT: SHX5663

## 2016-08-31 LAB — GLUCOSE, CAPILLARY
GLUCOSE-CAPILLARY: 138 mg/dL — AB (ref 65–99)
GLUCOSE-CAPILLARY: 165 mg/dL — AB (ref 65–99)

## 2016-08-31 SURGERY — MYRINGOTOMY WITH TUBE PLACEMENT
Anesthesia: General | Laterality: Bilateral | Wound class: Clean Contaminated

## 2016-08-31 MED ORDER — PROPOFOL 10 MG/ML IV BOLUS
INTRAVENOUS | Status: DC | PRN
  Administered 2016-08-31: 200 mg via INTRAVENOUS

## 2016-08-31 MED ORDER — FAMOTIDINE 20 MG PO TABS
20.0000 mg | ORAL_TABLET | Freq: Once | ORAL | Status: AC
  Administered 2016-08-31: 20 mg via ORAL

## 2016-08-31 MED ORDER — SUCCINYLCHOLINE CHLORIDE 20 MG/ML IJ SOLN
INTRAMUSCULAR | Status: DC | PRN
  Administered 2016-08-31: 120 mg via INTRAVENOUS

## 2016-08-31 MED ORDER — SODIUM CHLORIDE 0.9 % IV SOLN
INTRAVENOUS | Status: DC
  Administered 2016-08-31: 07:00:00 via INTRAVENOUS

## 2016-08-31 MED ORDER — FENTANYL CITRATE (PF) 100 MCG/2ML IJ SOLN
INTRAMUSCULAR | Status: DC | PRN
  Administered 2016-08-31: 50 ug via INTRAVENOUS

## 2016-08-31 MED ORDER — CIPROFLOXACIN-DEXAMETHASONE 0.3-0.1 % OT SUSP
OTIC | Status: DC | PRN
  Administered 2016-08-31: 4 [drp]

## 2016-08-31 MED ORDER — FENTANYL CITRATE (PF) 100 MCG/2ML IJ SOLN: 25.0000 ug | INTRAMUSCULAR | Status: DC | PRN

## 2016-08-31 MED ORDER — ONDANSETRON HCL 4 MG/2ML IJ SOLN
INTRAMUSCULAR | Status: DC | PRN
  Administered 2016-08-31: 4 mg via INTRAVENOUS

## 2016-08-31 MED ORDER — CIPROFLOXACIN-DEXAMETHASONE 0.3-0.1 % OT SUSP
OTIC | Status: AC
  Filled 2016-08-31: qty 7.5

## 2016-08-31 MED ORDER — FAMOTIDINE 20 MG PO TABS
ORAL_TABLET | ORAL | Status: AC
  Administered 2016-08-31: 20 mg via ORAL
  Filled 2016-08-31: qty 1

## 2016-08-31 MED ORDER — PROMETHAZINE HCL 12.5 MG PO TABS: 12.5000 mg | ORAL_TABLET | Freq: Four times a day (QID) | ORAL | 0 refills | Status: DC | PRN

## 2016-08-31 MED ORDER — DEXAMETHASONE SODIUM PHOSPHATE 10 MG/ML IJ SOLN
INTRAMUSCULAR | Status: DC | PRN
  Administered 2016-08-31: 5 mg via INTRAVENOUS

## 2016-08-31 SURGICAL SUPPLY — 6 items
CANISTER SUCT 1200ML W/VALVE (MISCELLANEOUS) ×3 IMPLANT
GLOVE BIO SURGEON STRL SZ7.5 (GLOVE) ×6 IMPLANT
TOWEL OR 17X26 4PK STRL BLUE (TOWEL DISPOSABLE) ×3 IMPLANT
TUBE EAR ARMSTRONG HC 1.14X3.5 (OTOLOGIC RELATED) ×6 IMPLANT
TUBING CONNECTING 10 (TUBING) ×2 IMPLANT
TUBING CONNECTING 10' (TUBING) ×1

## 2016-08-31 NOTE — Discharge Instructions (Signed)
AMBULATORY SURGERY  °DISCHARGE INSTRUCTIONS ° ° °1) The drugs that you were given will stay in your system until tomorrow so for the next 24 hours you should not: ° °A) Drive an automobile °B) Make any legal decisions °C) Drink any alcoholic beverage ° ° °2) You may resume regular meals tomorrow.  Today it is better to start with liquids and gradually work up to solid foods. ° °You may eat anything you prefer, but it is better to start with liquids, then soup and crackers, and gradually work up to solid foods. ° ° °3) Please notify your doctor immediately if you have any unusual bleeding, trouble breathing, redness and pain at the surgery site, drainage, fever, or pain not relieved by medication. ° ° ° °4) Additional Instructions: ° ° ° ° ° ° ° °Please contact your physician with any problems or Same Day Surgery at [phone], Monday through Friday 6 am to 4 pm, or [place] [facility] [place] Main number at [phone].AMBULATORY SURGERY  °DISCHARGE INSTRUCTIONS ° ° °5) The drugs that you were given will stay in your system until tomorrow so for the next 24 hours you should not: ° °D) Drive an automobile °E) Make any legal decisions °F) Drink any alcoholic beverage ° ° °6) You may resume regular meals tomorrow.  Today it is better to start with liquids and gradually work up to solid foods. ° °You may eat anything you prefer, but it is better to start with liquids, then soup and crackers, and gradually work up to solid foods. ° ° °7) Please notify your doctor immediately if you have any unusual bleeding, trouble breathing, redness and pain at the surgery site, drainage, fever, or pain not relieved by medication. ° ° ° °8) Additional Instructions: ° ° ° ° ° ° ° °Please contact your physician with any problems or Same Day Surgery at [phone], Monday through Friday 6 am to 4 pm, or [place] [facility] [place] Main number at [phone].AMBULATORY SURGERY  °DISCHARGE INSTRUCTIONS ° ° °9) The drugs that you were given  will stay in your system until tomorrow so for the next 24 hours you should not: ° °G) Drive an automobile °H) Make any legal decisions °I) Drink any alcoholic beverage ° ° °10) You may resume regular meals tomorrow.  Today it is better to start with liquids and gradually work up to solid foods. ° °You may eat anything you prefer, but it is better to start with liquids, then soup and crackers, and gradually work up to solid foods. ° ° °11) Please notify your doctor immediately if you have any unusual bleeding, trouble breathing, redness and pain at the surgery site, drainage, fever, or pain not relieved by medication. ° ° ° °12) Additional Instructions: ° ° ° ° ° ° ° °Please contact your physician with any problems or Same Day Surgery at [phone], Monday through Friday 6 am to 4 pm, or [place] [facility] [place] Main number at [phone]. °

## 2016-08-31 NOTE — Anesthesia Procedure Notes (Signed)
Procedure Name: Intubation Date/Time: 08/31/2016 7:22 AM Performed by: Edyth GunnelsGILBERT, Kyrie Bun Pre-anesthesia Checklist: Patient identified, Emergency Drugs available, Suction available, Patient being monitored and Timeout performed Patient Re-evaluated:Patient Re-evaluated prior to inductionOxygen Delivery Method: Circle system utilized Preoxygenation: Pre-oxygenation with 100% oxygen Intubation Type: IV induction Ventilation: Mask ventilation without difficulty Laryngoscope Size: McGraph and 4 Grade View: Grade III Tube type: Oral Tube size: 7.0 mm Number of attempts: 1 Airway Equipment and Method: Stylet Placement Confirmation: ETT inserted through vocal cords under direct vision,  positive ETCO2,  CO2 detector and breath sounds checked- equal and bilateral Secured at: 21.5 cm Tube secured with: Tape Dental Injury: Teeth and Oropharynx as per pre-operative assessment  Difficulty Due To: Difficulty was anticipated Future Recommendations: Recommend- induction with short-acting agent, and alternative techniques readily available

## 2016-08-31 NOTE — Anesthesia Postprocedure Evaluation (Signed)
Anesthesia Post Note  Patient: Glendon AxeMisty L Arvelo  Procedure(s) Performed: Procedure(s) (LRB): MYRINGOTOMY WITH TUBE PLACEMENT (Bilateral)  Patient location during evaluation: PACU Anesthesia Type: General Level of consciousness: awake and alert Pain management: pain level controlled Vital Signs Assessment: post-procedure vital signs reviewed and stable Respiratory status: spontaneous breathing, nonlabored ventilation, respiratory function stable and patient connected to nasal cannula oxygen Cardiovascular status: blood pressure returned to baseline and stable Postop Assessment: no signs of nausea or vomiting Anesthetic complications: no    Last Vitals:  Vitals:   08/31/16 0848 08/31/16 0900  BP: (!) 104/91 129/71  Pulse: 84 85  Resp: 16   Temp: (!) 35.8 C     Last Pain:  Vitals:   08/31/16 0848  TempSrc: Temporal  PainSc:                  Cleda MccreedyJoseph K Bravery Ketcham

## 2016-08-31 NOTE — Transfer of Care (Signed)
Immediate Anesthesia Transfer of Care Note  Patient: Carol Collins  Procedure(s) Performed: Procedure(s): MYRINGOTOMY WITH TUBE PLACEMENT (Bilateral)  Patient Location: PACU  Anesthesia Type:General  Level of Consciousness: awake, alert  and oriented  Airway & Oxygen Therapy: Patient Spontanous Breathing and Patient connected to nasal cannula oxygen  Post-op Assessment: Report given to RN and Post -op Vital signs reviewed and stable  Post vital signs: Reviewed and stable  Last Vitals:  Vitals:   08/31/16 0749 08/31/16 0804  BP: (!) 143/95 99/68  Pulse: (!) 118 85  Resp: 17 (!) 24  Temp: 36.3 C     Last Pain:  Vitals:   08/31/16 0749  TempSrc:   PainSc: 0-No pain         Complications: No apparent anesthesia complications

## 2016-08-31 NOTE — Op Note (Signed)
..  08/31/2016  7:35 AM    Halford DecampJohnson, January  [id number]   Pre-Op Dx:  HEARING LOSS, Eustachian tube dysfunction  Post-op Dx: HEARING LOSS, Eustachian tube dysfunction  Proc:Bilateral myringotomy with tubes  Surg: Emmily Pellegrin  Anes:  General by mask  EBL:  None  Comp:  None  Findings:  Bilateral tubes placed  Procedure: With the patient in a comfortable supine position, general mask anesthesia was administered.  At an appropriate level, microscope and speculum were used to examine and clean the RIGHT ear canal.  The findings were as described above.  An anterior inferior radial myringotomy incision was sharply executed.  Middle ear contents were suctioned clear with a size 5 otologic suction.  A PE tube was placed without difficulty using a Rosen pick and Facilities manageralligator.  Ciprodex otic solution was instilled into the external canal, and insufflated into the middle ear.  A cotton ball was placed at the external meatus. Hemostasis was observed.  This side was completed.  After completing the RIGHT side, the LEFT side was done in identical fashion.    Following this  The patient was returned to anesthesia, awakened, and transferred to recovery in stable condition.  Dispo:  PACU to home  Plan: Routine drop use and water precautions.  Recheck my office three weeks.   Cliffton Spradley 7:35 AM 08/31/2016

## 2016-08-31 NOTE — Anesthesia Preprocedure Evaluation (Signed)
Anesthesia Evaluation  Patient identified by MRN, date of birth, ID band Patient awake    Reviewed: Allergy & Precautions, H&P , NPO status , Patient's Chart, lab work & pertinent test results  History of Anesthesia Complications Negative for: history of anesthetic complications  Airway Mallampati: III  TM Distance: >3 FB Neck ROM: full    Dental no notable dental hx. (+) Poor Dentition, Chipped   Pulmonary neg shortness of breath, Current Smoker,    Pulmonary exam normal breath sounds clear to auscultation       Cardiovascular Exercise Tolerance: Good hypertension, (-) angina(-) Past MI and (-) DOE Normal cardiovascular exam Rhythm:regular Rate:Normal     Neuro/Psych  Neuromuscular disease negative psych ROS   GI/Hepatic negative GI ROS, Neg liver ROS,   Endo/Other  diabetes, Type obesity  Renal/GU      Musculoskeletal   Abdominal   Peds  Hematology negative hematology ROS (+)   Anesthesia Other Findings Signs and symptoms suggestive of sleep apnea   Past Medical History: No date: Diabetes mellitus without complication (HCC) No date: Hypertension No date: Neuropathy (HCC)  Past Surgical History: No date: ABDOMINAL HYSTERECTOMY No date: CESAREAN SECTION No date: DIAGNOSTIC LAPAROSCOPY No date: laproscopy     Reproductive/Obstetrics negative OB ROS                             Anesthesia Physical Anesthesia Plan  ASA: III  Anesthesia Plan: General ETT   Post-op Pain Management:    Induction:   Airway Management Planned:   Additional Equipment:   Intra-op Plan:   Post-operative Plan:   Informed Consent: I have reviewed the patients History and Physical, chart, labs and discussed the procedure including the risks, benefits and alternatives for the proposed anesthesia with the patient or authorized representative who has indicated his/her understanding and  acceptance.     Plan Discussed with: Anesthesiologist, CRNA and Surgeon  Anesthesia Plan Comments:         Anesthesia Quick Evaluation

## 2016-08-31 NOTE — Addendum Note (Signed)
Addendum  created 08/31/16 1537 by Junious SilkMark Darivs Lunden, CRNA   Charge Capture section accepted

## 2016-08-31 NOTE — Anesthesia Procedure Notes (Signed)
Performed by: Ovie Cornelio       

## 2016-08-31 NOTE — H&P (Signed)
..  History and Physical paper copy reviewed and updated date of procedure and will be scanned into system.  

## 2016-09-19 ENCOUNTER — Encounter: Payer: Self-pay | Admitting: Cardiology

## 2016-10-16 ENCOUNTER — Other Ambulatory Visit (HOSPITAL_COMMUNITY): Payer: Self-pay | Admitting: Family Medicine

## 2016-10-16 MED ORDER — METFORMIN HCL 1000 MG PO TABS: 1000.0000 mg | ORAL_TABLET | Freq: Two times a day (BID) | ORAL | 3 refills | Status: DC

## 2016-10-26 ENCOUNTER — Ambulatory Visit: Payer: Medicaid Other | Admitting: Family Medicine

## 2016-11-16 ENCOUNTER — Other Ambulatory Visit: Payer: Self-pay | Admitting: Family Medicine

## 2016-11-16 DIAGNOSIS — E1142 Type 2 diabetes mellitus with diabetic polyneuropathy: Secondary | ICD-10-CM

## 2016-11-16 MED ORDER — GABAPENTIN 100 MG PO CAPS
ORAL_CAPSULE | ORAL | 0 refills | Status: DC
Start: 2016-11-16 — End: 2016-12-15

## 2016-11-17 ENCOUNTER — Ambulatory Visit: Payer: Medicaid Other | Admitting: Family Medicine

## 2016-11-21 ENCOUNTER — Telehealth: Payer: Self-pay | Admitting: Family Medicine

## 2016-11-21 ENCOUNTER — Ambulatory Visit (INDEPENDENT_AMBULATORY_CARE_PROVIDER_SITE_OTHER): Payer: Medicaid Other | Admitting: Family Medicine

## 2016-11-21 ENCOUNTER — Encounter: Payer: Self-pay | Admitting: Family Medicine

## 2016-11-21 VITALS — BP 130/84 | HR 84 | Temp 97.7°F | Resp 16 | Ht 61.0 in | Wt 212.0 lb

## 2016-11-21 DIAGNOSIS — I10 Essential (primary) hypertension: Secondary | ICD-10-CM

## 2016-11-21 DIAGNOSIS — E785 Hyperlipidemia, unspecified: Secondary | ICD-10-CM | POA: Diagnosis not present

## 2016-11-21 DIAGNOSIS — J0110 Acute frontal sinusitis, unspecified: Secondary | ICD-10-CM

## 2016-11-21 DIAGNOSIS — E1169 Type 2 diabetes mellitus with other specified complication: Secondary | ICD-10-CM

## 2016-11-21 DIAGNOSIS — E1142 Type 2 diabetes mellitus with diabetic polyneuropathy: Secondary | ICD-10-CM | POA: Diagnosis not present

## 2016-11-21 DIAGNOSIS — E66813 Obesity, class 3: Secondary | ICD-10-CM

## 2016-11-21 LAB — POCT GLYCOSYLATED HEMOGLOBIN (HGB A1C): Hemoglobin A1C: 5.8

## 2016-11-21 MED ORDER — AZITHROMYCIN 250 MG PO TABS: ORAL_TABLET | ORAL | 0 refills | Status: DC

## 2016-11-21 NOTE — Telephone Encounter (Signed)
Pt has a wart on hand and it keeps catching on things at work.  She was looking to buy something OTC but all the ones shed has looked at warn about being diabetic.  What do you suggest?  Her call back number is [phone]

## 2016-11-21 NOTE — Progress Notes (Signed)
Subjective:    Patient ID: Carol Collins, female    DOB: 05/23/1976, 41 y.o.   MRN: [id number]  Carol Collins is a 41 y.o. female presenting on 11/21/2016 for Diabetes (obtw pt has sinus infection from past 2 days no fever or chills, has mild HA)   HPI   CHRONIC DM, Type 2 / DM Neuropathy: Reports no concerns CBGs: Avg 100-120s (previously about 1 month ago) now not checking regularly due to job change. Meds: Exenatide ER (Bydureon) 2mg  weekly (started in 07/13/16), Metformin 1000mg  BID Reports good compliance. Tolerating well, does admit to mild dizziness and woozy within first 30 min after injection, states does have some small knots under skin where injections are. 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 She states missed a few doses and realized that the Gabapentin is helping. Describes bilateral plantar feet with hypersensitivity and some pain / tingling. Also this medication helps with her insomnia  - 17 lb weight loss in 3 months Denies hypoglycemia, polyuria, visual changes, numbness or tingling.  HYPERLIPIDEMIA / Morbid Obesity - Reports no concerns. Last lipid panel 04/2016, high TG, low HDL, normal LDL. Has never been on statin therapy or cholesterol medication before  CHRONIC HTN: Reports no concerns, doesn't check BP outside office Current Meds - Lisinopril 5mg  daily   Reports good compliance, took meds today. Tolerating well, w/o complaints. Denies CP, dyspnea, HA, edema, dizziness / lightheadedness  ACUTE SINUSITIS: - Reports symptoms started a few days ago with bilateral sinus  congestion and pressure, describes thicker congestion unable to clear with blowing nose, but sometimes thick white congestion with some blood streaks. Also with some frontal headache. - Has not started any OTC medications. Has Flonase and Loratadine but not using anymore, stopped Flonase after some bloody congestion. - Admits taste and smell is off and feels like similar sinus infection, has been treated with Azithormycin Z-pak in past with good results - No known sick contact - Denies sinus pain, fevers/chills, nausea, vomiting, difficulty breathing, cough  Social History  Substance Use Topics  . Smoking status: Current Some Day Smoker    Packs/day: 0.25    Years: 17.00    Types: Cigarettes  . Smokeless tobacco: Never Used  . Alcohol use No    Review of Systems Per HPI unless specifically indicated above     Objective:    BP 130/84   Pulse 84   Temp 97.7 F (36.5 C) (Oral)   Resp 16   Ht 5\' 1"  (1.549 m)   Wt 212 lb (96.2 kg)   LMP 11/20/2013 Comment: hysterectomy  SpO2 100%   BMI 40.06 kg/m   Wt Readings from Last 3 Encounters:  11/21/16 212 lb (96.2 kg)  08/24/16 222 lb (100.7 kg)  08/15/16 229 lb 9.6 oz (104.1 kg)    Physical Exam  Constitutional: She appears well-developed and well-nourished. No distress.  Well-appearing, comfortable, cooperative, obese  HENT:  Head: Normocephalic and atraumatic.  Mild bilateral frontal sinus tenderness, maxillary sinuses non-tender. Nares patent with mild congestion, but no purulence or edema. Bilateral TMs clear without erythema, effusion or bulging, teal tympanostomy tubes in place bilaterally. Oropharynx  clear without erythema, exudates, edema or asymmetry.  Eyes: Conjunctivae are normal.  Neck: Normal range of motion. Neck supple. No thyromegaly present.  Cardiovascular: Normal rate, regular rhythm, normal heart sounds and intact distal pulses.   No murmur heard. Pulmonary/Chest: Breath sounds normal. No respiratory distress.  She has no wheezes. She has no rales.  Musculoskeletal: She exhibits no edema.  Lymphadenopathy:    She has no cervical adenopathy.  Neurological: She is alert.  Skin: Skin is warm and dry. She is not diaphoretic.  Psychiatric: Her behavior is normal.  Nursing note and vitals reviewed.    DM Foot Exam: Bilateral feet appear generalized dry skin with only minor callus formation on plantar surface. No ulceration or breakdown. Mostly intact to monofilament sensation, but some mild reduced areas bilateral toes and forefoot. Also with significant hypersensitivity mostly generalized.  Results for orders placed or performed in visit on 11/21/16  POCT HgB A1C  Result Value Ref Range   Hemoglobin A1C 5.8    Lipid Panel     Component Value Date/Time   CHOL 154 05/04/2016 1329   CHOL 165 11/10/2015 0951   TRIG 193 (H) 05/04/2016 1329   HDL 41 (L) 05/04/2016 1329   HDL 43 11/10/2015 0951   CHOLHDL 3.8 05/04/2016 1329   VLDL 39 (H) 05/04/2016 1329   LDLCALC 74 05/04/2016 1329   LDLCALC 78 11/10/2015 0951      Chemistry      Component Value Date/Time   NA 136 08/24/2016 1202   NA 135 11/10/2015 0951   NA 138 04/22/2014 1043   K 3.4 (L) 08/24/2016 1202   K 3.7 04/22/2014 1043   CL 100 (L) 08/24/2016 1202   CL 105 04/22/2014 1043   CO2 26 08/24/2016 1202   CO2 30 04/22/2014 1043   BUN 10 08/24/2016 1202   BUN 13 11/10/2015 0951   BUN 6 (L) 04/22/2014 1043   CREATININE 0.82 08/24/2016 1202   CREATININE 0.78 05/04/2016 1329   GLU 92 05/20/2015      Component Value Date/Time   CALCIUM 8.7 (L) 08/24/2016 1202   CALCIUM 8.6 04/22/2014 1043   ALKPHOS 68 05/04/2016 1329   AST 32 (H) 05/04/2016 1329   ALT 35 (H) 05/04/2016 1329   BILITOT 0.6 05/04/2016 1329   BILITOT 0.3 11/10/2015 0951          Assessment & Plan:   Problem List Items Addressed This Visit    Obesity, Class III, BMI 40-49.9 (morbid obesity) (HCC)    Recent wt loss 17 lb in 3 months on GLP1, improved DM  control. - Encouraged regular exercise and activity - Check future chemistry, lipids      Hyperlipidemia associated with type 2 diabetes mellitus (HCC)    Abnormal TG and low HDL, normal LDL, also with obesity  Plan: 1. Discussion on future recommendation for statin therapy ASCVD risk reduction, will consider this in future still, wants to await re-check lipids 6 months 2. Follow-up 6 mo after fasting lipids      Relevant Orders   Lipid panel   DM type 2 with diabetic peripheral neuropathy (HCC) - Primary    Continued significant improvement on Bydureon, now A1c 5.8 (from 7.1), despite limited lifestyle changes and no regular exercise. Also with 17 lb wt loss on GLP1 in 3 months. - Complication with DM neuropathy  Plan: 1. Continue Bydureon 2mg  weekly, seems to be tolerating well. Continue Metformin 1000mg  BID for now, future consider reduced dose Metformin if  can add regular exercise and if A1c remains < 7.0 2. Check CBGs 3. Titrate up Gabapentin as advised, only on 100mg  caps, can add 100 in afternoon, and then titrate up evening dose, will need new rx, last filled end of 10/2016, advised her to send message when on appropriate dose, can only get monthly supply, likely needs 120-180 pills 4. Due for Flu/TDap, will get through HD, bring record 5. Repeat Foot exam today 6. Discussion on future recommendation for statin therapy ASCVD risk reduction, will consider 7. Check future labs, chemistry, fasting lipids, A1c in 6 months 8. Follow-up 6 months for annual physical, A1c      Relevant Orders   POCT HgB A1C (Completed)   Hemoglobin A1c   COMPLETE METABOLIC PANEL WITH GFR   BP (high blood pressure)    Well controlled, on low dose ACEi for DM No complication  Plan: 1. Continue Lisinopril 5mg  daily 2. Check future CMET in 6 months 3. Follow-up      Relevant Orders   COMPLETE METABOLIC PANEL WITH GFR    Other Visit Diagnoses    Acute non-recurrent frontal sinusitis       Consistent with early developing acute frontal sinusitis, likely initially viral URI vs allergic rhinitis component with history of similar sinusitis symptoms.  Plan: 1. Reassurance, likely self-limited , but given history and DM, will provide antibiotic Azithromycin for use in 24-48 hours if not improving. 2. Resume Loratadine (Claritin) 10mg  daily and Flonase 2 sprays in each nostril daily for next 4-6 weeks, then may stop and use seasonally or as needed 3. Supportive care with nasal saline OTC, hydration, may try OTC Mucinex 4. Return criteria reviewed     Relevant Medications   azithromycin (ZITHROMAX Z-PAK) 250 MG tablet      Meds ordered this encounter  Medications  . azithromycin (ZITHROMAX Z-PAK) 250 MG tablet    Sig: Take 2 tabs (500mg  total) on Day 1. Take 1 tab (250mg ) daily for next 4 days.    Dispense:  6 tablet    Refill:  0      Follow up plan: Return in about 6 months (around 05/21/2017) for Annual Physical.  Saralyn Pilar, DO [facility] 11/21/2016, 9:26 AM

## 2016-11-21 NOTE — Assessment & Plan Note (Signed)
Abnormal TG and low HDL, normal LDL, also with obesity  Plan: 1. Discussion on future recommendation for statin therapy ASCVD risk reduction, will consider this in future still, wants to await re-check lipids 6 months 2. Follow-up 6 mo after fasting lipids

## 2016-11-21 NOTE — Assessment & Plan Note (Signed)
Recent wt loss 17 lb in 3 months on GLP1, improved DM control. - Encouraged regular exercise and activity - Check future chemistry, lipids

## 2016-11-21 NOTE — Telephone Encounter (Signed)
She may use OTC topical wart medicine, usually first line is Salicylic acid (comes in different forms), the warning for diabetic patients is usually for warts on the feet, and that is because any foot problem can have even slow healing and increase risk of infection in diabetic. Hand is less likely to cause this problem.  Saralyn PilarAlexander Tarence Searcy, DO [facility] 11/21/2016, 3:43 PM

## 2016-11-21 NOTE — Assessment & Plan Note (Signed)
Well controlled, on low dose ACEi for DM No complication  Plan: 1. Continue Lisinopril 5mg  daily 2. Check future CMET in 6 months 3. Follow-up

## 2016-11-21 NOTE — Assessment & Plan Note (Signed)
Continued significant improvement on Bydureon, now A1c 5.8 (from 7.1), despite limited lifestyle changes and no regular exercise. Also with 17 lb wt loss on GLP1 in 3 months. - Complication with DM neuropathy  Plan: 1. Continue Bydureon 2mg  weekly, seems to be tolerating well. Continue Metformin 1000mg  BID for now, future consider reduced dose Metformin if can add regular exercise and if A1c remains < 7.0 2. Check CBGs 3. Titrate up Gabapentin as advised, only on 100mg  caps, can add 100 in afternoon, and then titrate up evening dose, will need new rx, last filled end of 10/2016, advised her to send message when on appropriate dose, can only get monthly supply, likely needs 120-180 pills 4. Due for Flu/TDap, will get through HD, bring record 5. Repeat Foot exam today 6. Discussion on future recommendation for statin therapy ASCVD risk reduction, will consider 7. Check future labs, chemistry, fasting lipids, A1c in 6 months 8. Follow-up 6 months for annual physical, A1c

## 2016-11-21 NOTE — Patient Instructions (Signed)
Thank you for coming in to clinic today.  1. A1c today 5.8, down from 7.1. This is good news. It sounds like Diabetes is well controlled on current regimen. I think the Louanna RawBydureon is working very well. - As discussed, I would be cautious about your lifestyle and try to make sure you do your part to keep these improvements. Try to limit carbs as you are, and follow a Diabetic Diet. In the future, if you can work on adding some form of regular exercise a few days a week this will certainly help everything as well (weight, sugar, cholesterol) - Continue current regimen  2. For Neuropathy - Increase Gabapentin, you can choose your dosing regimen here, as tolerated, add a 100mg  dose to any time, either morning, afternoon, or evening, go up by 1 dose every 3-5 days to make sure you tolerate it well. Maybe next you can add a dose in afternoon and then 1-2 more in evening over next few weeks. - You have no further refills on Gabapentin - When you find a regimen that helps, let me know by MyChart message or phone message, and I will send in a new rx, most likely we can increase amount up to 120 or 150 or 180 pills per month  1. It sounds like you have an early Sinusitis Infection - If not improving, you may go ahead and take Azithromycin antibiotic 2 pills day 1 then 1 pill each day for 4 days - Resume Loratadine (Claritin) 10mg  daily, and within next week or so you can resume Flonase 2 sprays in each nostril daily for next 4-6 weeks, then you may stop and use seasonally or as needed - Recommend to try using Nasal Saline spray multiple times a day to help flush out congestion and clear sinuses (keep nasal tissue moist avoid bleeding) - Improve hydration by drinking plenty of clear fluids (water) to reduce secretions and thin congestion - Also OTC Mucinex can help clear thicker congestion  In future consider Statin Cholesterol medication, atorvastatin, crestor, lipitor etc. These are recommended in all  diabetic patients to reduce risk of heart disease and stroke.  Remember to get Flu Shot and Tetanus TDap shots, bring us a copy of the record saying you got the vaccine.  You will be due for FASTING BLOOD WORK (no food or drink after midnight before, only water or coffee without cream/sugar on the morning of)  - Please go ahead and schedule a "Lab Only" visit in the morning at the clinic for lab draw in 6 months, after 05/04/17 - Make sure Lab Only appointment is at least 1-2 weeks before your next appointment, so that results will be available  For Lab Results, once available within 2-3 days of blood draw, you can can log in to MyChart online to view your results and a brief explanation. Also, we can discuss results at next follow-up visit.   Please schedule a follow-up appointment with Dr. Althea CharonKaramalegos in 6 months after 05/04/17 for Annual Physical (DM)  If you have any other questions or concerns, please feel free to call the clinic or send a message through MyChart. You may also schedule an earlier appointment if necessary.  Saralyn PilarAlexander Karamalegos, DO [facility]

## 2016-12-15 ENCOUNTER — Other Ambulatory Visit: Payer: Self-pay | Admitting: Family Medicine

## 2016-12-15 ENCOUNTER — Encounter: Payer: Self-pay | Admitting: Family Medicine

## 2016-12-15 DIAGNOSIS — E1142 Type 2 diabetes mellitus with diabetic polyneuropathy: Secondary | ICD-10-CM

## 2016-12-29 ENCOUNTER — Telehealth: Payer: Self-pay | Admitting: Family Medicine

## 2016-12-29 DIAGNOSIS — T3695XA Adverse effect of unspecified systemic antibiotic, initial encounter: Principal | ICD-10-CM

## 2016-12-29 DIAGNOSIS — B379 Candidiasis, unspecified: Secondary | ICD-10-CM

## 2016-12-29 MED ORDER — FLUCONAZOLE 150 MG PO TABS: ORAL_TABLET | ORAL | 0 refills | Status: DC

## 2016-12-29 NOTE — Telephone Encounter (Signed)
Sent Diflucan 150mg  take one on day 1 then may repeat on day 3 if still yeast infection, in setting of antibiotic azithromycin induced yeast infection.  Saralyn PilarAlexander Karamalegos, DO [facility] 12/29/2016, 12:06 PM

## 2016-12-29 NOTE — Telephone Encounter (Signed)
Pt. Called states that the antibiotic have cause a yeast infection need something called in. Pt call back [phone]

## 2017-01-26 ENCOUNTER — Other Ambulatory Visit: Payer: Self-pay | Admitting: *Deleted

## 2017-01-26 DIAGNOSIS — J301 Allergic rhinitis due to pollen: Secondary | ICD-10-CM

## 2017-01-26 MED ORDER — FLUTICASONE PROPIONATE 50 MCG/ACT NA SUSP: 2.0000 | Freq: Every day | NASAL | 11 refills | Status: DC

## 2017-03-09 ENCOUNTER — Other Ambulatory Visit: Payer: Self-pay

## 2017-03-09 DIAGNOSIS — R600 Localized edema: Secondary | ICD-10-CM

## 2017-03-09 MED ORDER — FUROSEMIDE 20 MG PO TABS: ORAL_TABLET | ORAL | 5 refills | Status: DC

## 2017-03-12 ENCOUNTER — Other Ambulatory Visit: Payer: Self-pay | Admitting: Family Medicine

## 2017-03-12 MED ORDER — METFORMIN HCL 1000 MG PO TABS: 1000.0000 mg | ORAL_TABLET | Freq: Two times a day (BID) | ORAL | 2 refills | Status: DC

## 2017-03-12 NOTE — Telephone Encounter (Signed)
Last ov 11/21/16 Last filled 10/16/16 Last A1c checked on 11/21/16 5.8%. Patient has fu on 05/22/17. Please review. Thank you. sd

## 2017-03-12 NOTE — Telephone Encounter (Signed)
Pt. Called requesting a  Refill on metformin.Pt. Call back  # is  [phone]

## 2017-04-09 ENCOUNTER — Ambulatory Visit: Payer: Medicaid Other | Admitting: Family Medicine

## 2017-04-23 ENCOUNTER — Encounter: Payer: Self-pay | Admitting: Family Medicine

## 2017-04-30 ENCOUNTER — Encounter: Payer: Self-pay | Admitting: Family Medicine

## 2017-04-30 ENCOUNTER — Ambulatory Visit (INDEPENDENT_AMBULATORY_CARE_PROVIDER_SITE_OTHER): Payer: Medicaid Other | Admitting: Family Medicine

## 2017-04-30 ENCOUNTER — Telehealth: Payer: Self-pay

## 2017-04-30 VITALS — BP 114/72 | HR 89 | Temp 98.2°F | Resp 16 | Ht 61.0 in | Wt 203.0 lb

## 2017-04-30 DIAGNOSIS — B3731 Acute candidiasis of vulva and vagina: Secondary | ICD-10-CM

## 2017-04-30 DIAGNOSIS — B379 Candidiasis, unspecified: Secondary | ICD-10-CM | POA: Diagnosis not present

## 2017-04-30 DIAGNOSIS — B373 Candidiasis of vulva and vagina: Secondary | ICD-10-CM

## 2017-04-30 DIAGNOSIS — N390 Urinary tract infection, site not specified: Secondary | ICD-10-CM

## 2017-04-30 DIAGNOSIS — T3695XA Adverse effect of unspecified systemic antibiotic, initial encounter: Secondary | ICD-10-CM

## 2017-04-30 LAB — POCT URINALYSIS DIPSTICK
Bilirubin, UA: NEGATIVE
Glucose, UA: NEGATIVE
KETONES UA: NEGATIVE
Nitrite, UA: NEGATIVE
SPEC GRAV UA: 1.01 (ref 1.010–1.025)
UROBILINOGEN UA: NEGATIVE U/dL — AB
pH, UA: 5.5 (ref 5.0–8.0)

## 2017-04-30 MED ORDER — FLUCONAZOLE 150 MG PO TABS: ORAL_TABLET | ORAL | 1 refills | Status: DC

## 2017-04-30 MED ORDER — NITROFURANTOIN MONOHYD MACRO 100 MG PO CAPS: 100.0000 mg | ORAL_CAPSULE | Freq: Two times a day (BID) | ORAL | 0 refills | Status: DC

## 2017-04-30 NOTE — Telephone Encounter (Signed)
The pt called back complaining of Bodyaches, chills, and eye discomfort. She seems to think she possible might have the flu. I spoke w/ Dr. Kirtland BouchardK and he informed me to instructed her to take pain medication, rest and drink plenty of fluids. If symptoms doesn't improve in the next 24-48 hrs to call back and schedule an appt.

## 2017-04-30 NOTE — Progress Notes (Signed)
Subjective:    Patient ID: Daylani L Lievanos, female    DOB: 3/9/[name]1977, 41 y.o.   MRN: [id number]  Glendon AxeMisty L Kerce is a 41 y.o. female presenting on 04/30/2017 for Urinary Tract Infection (onset 2 days )   HPI   ACUTE CYSTITIS with hematuria / VAGINAL DISCHARGE: - Reports symptoms started about 5 days ago with dysuria and pain with urination, gradually got a little worse, then has remained persistent. Today also admits mild pain in Left lower back with urination then this has resolved afterwards. Describes pain not necessarily burning with voiding, and not each time - Last UTI 11/2015, treated with macrobid with resolution. She does not get frequent UTI. Has history of DM, and has periods of recurrent yeast infections. - Admits edema in lower leg this morning (ate stir fry last night), some slight pinkish color to urine this morning (no gross hematuria) - Denies any significant urinary frequency, fevers/chills, sweats, nausea, vomiting  Social History  Substance Use Topics  . Smoking status: Current Some Day Smoker    Packs/day: 0.25    Years: 17.00    Types: Cigarettes  . Smokeless tobacco: Never Used  . Alcohol use No    Review of Systems Per HPI unless specifically indicated above     Objective:    BP 114/72   Pulse 89   Temp 98.2 F (36.8 C) (Oral)   Resp 16   Ht 5\' 1"  (1.549 m)   Wt 203 lb (92.1 kg)   LMP 11/20/2013 Comment: hysterectomy  BMI 38.36 kg/m   Wt Readings from Last 3 Encounters:  04/30/17 203 lb (92.1 kg)  11/21/16 212 lb (96.2 kg)  08/24/16 222 lb (100.7 kg)    Physical Exam  Constitutional: She appears well-developed and well-nourished. No distress.  Well-appearing, comfortable, cooperative  Cardiovascular: Normal rate.   Pulmonary/Chest: Effort normal.  Abdominal: Soft. She exhibits no distension. There is no tenderness.  Genitourinary:  Genitourinary Comments: Declined pelvic exam  Musculoskeletal: She exhibits edema (trace bilateral lower  extremity edema, non pitting).  Low back is non tender on exam today. No CVAT  Skin: Skin is warm and dry. No rash noted. She is not diaphoretic. No erythema.  Psychiatric: She has a normal mood and affect. Her behavior is normal.  Well groomed, good eye contact, normal speech and thoughts  Nursing note and vitals reviewed.  Results for orders placed or performed in visit on 04/30/17  POCT Urinalysis Dipstick  Result Value Ref Range   Color, UA amber    Clarity, UA clear    Glucose, UA Negative    Bilirubin, UA Negative    Ketones, UA Negative    Spec Grav, UA 1.010 1.010 - 1.025   Blood, UA trace    pH, UA 5.5 5.0 - 8.0   Protein, UA trace    Urobilinogen, UA negative (A) 0.2 or 1.0 E.U./dL   Nitrite, UA Negative    Leukocytes, UA Trace (A) Negative      Assessment & Plan:   Problem List Items Addressed This Visit    None    Visit Diagnoses    Urinary tract infection without hematuria, site unspecified    -  Primary  Clinically consistent with UTI and confirmed on UA. No recent UTIs or abx courses, last 11/2015 resolved with Macrobid, with E Coli on culture, sensitive. Known DM2. No concern for pyelo today (no systemic symptoms, neg fever, persistent back pain, n/v). No significant history of nephrolithiasis.  Plan:  1. UA - neg nitrite, trace leuks, RBC trace 2. Ordered Urine culture 3. Macrobid 100mg  BID for 5 days 4. Improve PO hydration 5. Given Diflucan for yeast - has discharge now and also prone to secondary yeast infection from antibiotics 6. Follow-up 1-2 week PRN if worsening     Relevant Medications   nitrofurantoin, macrocrystal-monohydrate, (MACROBID) 100 MG capsule   fluconazole (DIFLUCAN) 150 MG tablet   Other Relevant Orders   POCT Urinalysis Dipstick (Completed)   CULTURE, URINE COMPREHENSIVE   Vaginal yeast infection      Treat with diflucan - start after finish antibiotics for UTI    Relevant Medications   fluconazole (DIFLUCAN) 150 MG tablet    Antibiotic-induced yeast infection       Relevant Medications   fluconazole (DIFLUCAN) 150 MG tablet      Meds ordered this encounter  Medications  . nitrofurantoin, macrocrystal-monohydrate, (MACROBID) 100 MG capsule    Sig: Take 1 capsule (100 mg total) by mouth 2 (two) times daily. For 5 days    Dispense:  10 capsule    Refill:  0  . fluconazole (DIFLUCAN) 150 MG tablet    Sig: Take one tablet by mouth on Day 1. Repeat dose 2nd tablet on Day 3.    Dispense:  2 tablet    Refill:  1     Follow up plan: Return if symptoms worsen or fail to improve, for Already scheduled in 05/22/17.  Saralyn Pilar, DO [facility] 04/30/2017, 10:37 AM

## 2017-04-30 NOTE — Patient Instructions (Signed)
Thank you for coming to the clinic today.  1. You have a Urinary Tract Infection - this is very common, your symptoms are reassuring and you should get better within 1 week on the antibiotics - Start Macrobid 100mg  twice daily for 5 days complete entire course, even if feeling better - We sent urine for a culture, we will call you within next few days if we need to change antibiotics - Please drink plenty of fluids, improve hydration over next 1 week  After finish antibiotic, take Diflucan for yeast, gave refill  If symptoms worsening, developing nausea / vomiting, worsening back pain, fevers / chills / sweats, then please return for re-evaluation sooner.  To prevent bladder and kidney infections...  1. Pee within 30 minutes after sex  2. Wipe front to back after using the restroom  3. Drink enough water to keep your pee clear to pale yellow  If you think you are getting another bladder infection, start drinking cranberry juice and come see us so we can check the urine.  Please schedule a Follow-up Appointment to: Return if symptoms worsen or fail to improve, for Already scheduled in 05/22/17.  If you have any other questions or concerns, please feel free to call the clinic or send a message through MyChart. You may also schedule an earlier appointment if necessary.  Saralyn PilarAlexander Jalyssa Fleisher, DO [facility]

## 2017-05-01 ENCOUNTER — Encounter: Payer: Self-pay | Admitting: Family Medicine

## 2017-05-01 DIAGNOSIS — N12 Tubulo-interstitial nephritis, not specified as acute or chronic: Secondary | ICD-10-CM

## 2017-05-01 MED ORDER — CIPROFLOXACIN HCL 500 MG PO TABS: 500.0000 mg | ORAL_TABLET | Freq: Two times a day (BID) | ORAL | 0 refills | Status: DC

## 2017-05-01 NOTE — Telephone Encounter (Signed)
Called patient back, discussed her recent symptoms, clarified her symptoms as the update from yesterday seemed to be different symptoms more flu-like and cold symptoms, today she clarifies only having intermittent fever/chills and back pain associated with urinary symptoms, not worsening seemed a little better so far, without nausea, vomiting. Tolerating PO still.  I advised her that we would SWITCH antibiotic, stop Macrobid and start Cipro 500mg  BID for 7 days, new rx sent to pharmacy. Also she may schedule a Nurse Only visit for Ceftriaxone (Rocephin) 1g IM x 1 dose this afternoon or tomorrow morning as x 1 dose only. We are still waiting on urine culture from yesterday, will stay tuned if we need to adjust antibiotic treatment. Return precautions given for her when to go to hospital ED or Urgent Care or seek other therapy if worsening symptoms despite treatment or if can't tolerate PO antibiotics may need IV if worsening. Patient understands plan. Note written out of work for tomorrow 6/13 and back on Thursday 6/14 available for pick-up at front office.  Saralyn PilarAlexander Zoella Roberti, DO [facility] 05/01/2017, 12:22 PM

## 2017-05-02 ENCOUNTER — Other Ambulatory Visit: Payer: Self-pay

## 2017-05-02 ENCOUNTER — Ambulatory Visit (INDEPENDENT_AMBULATORY_CARE_PROVIDER_SITE_OTHER): Payer: Medicaid Other

## 2017-05-02 VITALS — BP 117/66 | HR 88 | Temp 99.0°F | Ht 62.0 in | Wt 203.0 lb

## 2017-05-02 DIAGNOSIS — N390 Urinary tract infection, site not specified: Secondary | ICD-10-CM

## 2017-05-02 LAB — CULTURE, URINE COMPREHENSIVE

## 2017-05-02 MED ORDER — CEFTRIAXONE SODIUM 1 G IJ SOLR
1.0000 g | Freq: Once | INTRAMUSCULAR | Status: AC
  Administered 2017-05-02: 1 g via INTRAMUSCULAR

## 2017-05-22 ENCOUNTER — Encounter: Payer: Medicaid Other | Admitting: Family Medicine

## 2017-05-22 ENCOUNTER — Ambulatory Visit (INDEPENDENT_AMBULATORY_CARE_PROVIDER_SITE_OTHER): Payer: Medicaid Other | Admitting: Family Medicine

## 2017-05-22 ENCOUNTER — Encounter: Payer: Self-pay | Admitting: Family Medicine

## 2017-05-22 VITALS — BP 110/70 | HR 83 | Temp 98.5°F | Resp 16 | Ht 61.0 in | Wt 197.0 lb

## 2017-05-22 DIAGNOSIS — E1142 Type 2 diabetes mellitus with diabetic polyneuropathy: Secondary | ICD-10-CM | POA: Diagnosis not present

## 2017-05-22 DIAGNOSIS — L600 Ingrowing nail: Secondary | ICD-10-CM

## 2017-05-22 DIAGNOSIS — L732 Hidradenitis suppurativa: Secondary | ICD-10-CM

## 2017-05-22 DIAGNOSIS — B078 Other viral warts: Secondary | ICD-10-CM

## 2017-05-22 LAB — POCT GLYCOSYLATED HEMOGLOBIN (HGB A1C): Hemoglobin A1C: 5.4 (ref ?–5.7)

## 2017-05-22 NOTE — Patient Instructions (Addendum)
Thank you for coming to the clinic today.  1.  Diabetes well controlled A1c 5.4, great job  Keep up the good work  Call us back with the name of your glucometer brand name and which supplies specifically you need  2. Referral to [facility] for ingrown toenails, stay tuned for apt  3. ---------------------------------------------------------------------  Referral sent to Dermatology - stay tuned within 2 weeks for appointment  For wart, lip, and abscesses, also belly button problem  [facility]   [address] [place], [place] [address] Hours: 8AM-5PM Phone: [phone]  Armida Sansavid Kowalski, MD Willeen Nieceara Stewart, MD  3.  Hidradenitis with recurrent pustules  If you get infection with abscess that does not improve, contact office, may need oral antibiotics or Bactroban topical antibiotic if you need it  Bleach Bath To decrease bacterial infection and reduce symptoms, bleach baths are sometimes recommended. Add  -  cup of common household bleach (wear gloves and eye protection and avoid any skin contact) to a bathtub full of water (mix the tub before getting in). Soak your torso or just the affected part of your skin for about 10 minutes. Limit this type of bleach baths to no more than twice a week. If this causes any significant symptoms of skin irritation or other symptoms, please get out of the tub immediately and rinse the affected area off.  Please schedule a Follow-up Appointment to: Return in about 6 weeks (around 07/03/2017) for Annual Physical.  If you have any other questions or concerns, please feel free to call the clinic or send a message through MyChart. You may also schedule an earlier appointment if necessary.  Additionally, you may be receiving a survey about your experience at our clinic within a few days to 1 week by e-mail or mail. We value your feedback.  Saralyn PilarAlexander Shaneeka Scarboro, DO [facility]

## 2017-05-22 NOTE — Progress Notes (Addendum)
Subjective:    Patient ID: Glendon AxeMisty L Pantaleon, female    DOB: 05/18/1976, 41 y.o.   MRN: [id number]  Glendon AxeMisty L Marrow is a 41 y.o. female presenting on 05/22/2017 for Diabetes  HPI   Ingrown Toenails Bilateral great toe medial ingrown, L toenail is thicker and causing more problem, over years worsening, has had mild intermittent infection, occasional stinging pain. [facility], her daughter goes there.  R palmar wart, below 5th finger - Recurrence, tried topical chemical treatment OTC including OTC cryotherapy. Interested in more definitive therapy - Also has a knot on lip, would like to discuss with Dermatology  Recurrent abscesses under abdomen / pannus - Reports chronic problem for years, has large overhanging pannus on abdomen, with inc moisture under skin fold, worse in summer with heat and moisture. - Currently no active abscess or lesion. has residual scars, and will get typical red pustules with drainage, no specific therapy used for them. No recent antibiotics. No prior surgery  Belly button leaking fluid - Additional complaint states she will notice small amount of clear fluid leaking out of her belly button usually only notice if put finger in to feel, describes fluid as having an odor but not foul. Never bleeding or pus. Noticed slight darkening of skin around belly button, present for >1 year. Maybe has episode of leakage for few days every other month - history of laparoscopy, hysterectomy  CHRONIC DM, Type 2 / DM Neuropathy: Reports no concerns CBGs: Checks 2-3x weekly Meds: Exenatide ER (Bydureon) 2mg  weekly, Metformin 1000mg  BID  Reports good compliance. Tolerating well Currently on ACEi Lifestyle: - Diet (reduced sugar, but still eats some carbs) - Exercise (no regular exercise) - Has Diabetic Neuropathy, taking Gabapentin Denies hypoglycemia, polyuria, visual changes, numbness or tingling.  Health Maintenance: - Due for DM eye exam - Due for  pap smear - last 2015 (before last hysterectomy by Dr Garey Hamherry [facility], had hysterectomy due to bleeding complications), unsure if entire cervix was removed or not. Wil await op note. Additionally admits increased libido following hysterectomy. - Due for TDap based  Past Medical History:  Diagnosis Date  . Hypertension   . Neuropathy    Past Surgical History:  Procedure Laterality Date  . ABDOMINAL HYSTERECTOMY    . CESAREAN SECTION    . DIAGNOSTIC LAPAROSCOPY    . laproscopy    . MYRINGOTOMY WITH TUBE PLACEMENT Bilateral 08/31/2016   Procedure: MYRINGOTOMY WITH TUBE PLACEMENT;  Surgeon: Bud Facereighton Vaught, MD;  Location: [facility] ORS;  Service: ENT;  Laterality: Bilateral;   Social History   Social History  . Marital status: Married    Spouse name: N/A  . Number of children: N/A  . Years of education: N/A   Occupational History  . Not on file.   Social History Main Topics  . Smoking status: Current Some Day Smoker    Packs/day: 0.25    Years: 17.00    Types: Cigarettes  . Smokeless tobacco: Never Used  . Alcohol use No  . Drug use: No  . Sexual activity: Not on file   Other Topics Concern  . Not on file   Social History Narrative  . No narrative on file   Family History  Problem Relation Age of Onset  . Heart disease Father   . Diabetes Father   . Hyperlipidemia Father   . Hyperlipidemia Sister   . Diabetes Mother   . Diabetes Maternal Grandfather    Current Outpatient Prescriptions on File  Prior to Visit  Medication Sig  . Exenatide ER 2 MG PEN Inject 2 mg into the skin once a week.  . fluconazole (DIFLUCAN) 150 MG tablet Take one tablet by mouth on Day 1. Repeat dose 2nd tablet on Day 3.  . fluticasone (FLONASE) 50 MCG/ACT nasal spray Place 2 sprays into both nostrils daily.  . furosemide (LASIX) 20 MG tablet Take 1/2 to 1 pill by mouth as needed for swelling.  . gabapentin (NEURONTIN) 100 MG capsule Take 1 capsule by mouth in morning, 2 caps at lunch, and 3 caps at  bedtime.  Marland Kitchen lisinopril (PRINIVIL,ZESTRIL) 5 MG tablet Take 1 tablet (5 mg total) by mouth daily.  . metFORMIN (GLUCOPHAGE) 1000 MG tablet Take 1 tablet (1,000 mg total) by mouth 2 (two) times daily with a meal.  . promethazine (PHENERGAN) 12.5 MG tablet Take 1 tablet (12.5 mg total) by mouth every 6 (six) hours as needed for nausea or vomiting. (Patient not taking: Reported on 05/22/2017)   No current facility-administered medications on file prior to visit.     Review of Systems Per HPI unless specifically indicated above     Objective:    BP 110/70   Pulse 83   Temp 98.5 F (36.9 C) (Oral)   Resp 16   Ht 5\' 1"  (1.549 m)   Wt 197 lb (89.4 kg)   LMP 11/20/2013 Comment: hysterectomy  BMI 37.22 kg/m   Wt Readings from Last 3 Encounters:  05/22/17 197 lb (89.4 kg)  05/02/17 203 lb (92.1 kg)  04/30/17 203 lb (92.1 kg)    Physical Exam  Constitutional: She is oriented to person, place, and time. She appears well-developed and well-nourished. No distress.  Well-appearing, comfortable, cooperative, obese  HENT:  Head: Normocephalic and atraumatic.  Mouth/Throat: Oropharynx is clear and moist.  Eyes: Conjunctivae are normal. Right eye exhibits no discharge. Left eye exhibits no discharge.  Neck: Normal range of motion. Neck supple. No thyromegaly present.  Cardiovascular: Normal rate, regular rhythm, normal heart sounds and intact distal pulses.   No murmur heard. Pulmonary/Chest: Effort normal and breath sounds normal. No respiratory distress. She has no wheezes. She has no rales.  Abdominal: Soft. Bowel sounds are normal. She exhibits no distension and no mass. There is no tenderness. There is no rebound.  Umbilicus is benign appearing, no palpable herniation, no leakage  Under pannus some scattered healed scar evidence prior abscess, one with small opening sinus tract without any leakage, non tender no erythema  Musculoskeletal: Normal range of motion. She exhibits no edema.    Lymphadenopathy:    She has no cervical adenopathy.  Neurological: She is alert and oriented to person, place, and time.  Skin: Skin is warm and dry. No rash noted. She is not diaphoretic. No erythema.  Bilateral ingrown toenails without significant pain swelling erythema or infection, L toenail with thicker nail  Wart on R hand, palmar inferior to 5th digit uncomplicated  Psychiatric: She has a normal mood and affect. Her behavior is normal.  Well groomed, good eye contact, normal speech and thoughts  Nursing note and vitals reviewed.     Recent Labs  08/15/16 0901 11/21/16 0828 05/22/17 1034  HGBA1C 7.1 5.8 5.4    Results for orders placed or performed in visit on 05/22/17  POCT HgB A1C  Result Value Ref Range   Hemoglobin A1C 5.4 5.7      Assessment & Plan:   Problem List Items Addressed This Visit    Hidradenitis  suppurativa    Clinically without active abscess Chronic recurrent under abdominal pannus, moisture and heat Offered Bactroban PRN use, declined Routine counseling on hygiene, prevention, advised [facility] PRN per AVS instructions Referral to Derm Follow-up PRN flare      Relevant Orders   Ambulatory referral to Dermatology   DM type 2 with diabetic peripheral neuropathy (HCC) - Primary    Continued significant improvement on Bydureon, now A1c 5.4 (from 7s to 5), despite limited lifestyle changes and no regular exercise. Weight loss - Complication with DM neuropathy  Plan: 1. Continue Bydureon 2mg  weekly, continue Metformin 1000mg  BID - future may reduce Metformin dose 2. Check CBGs 3. Continue gabapentin 4. Still recommendation for statin therapy ASCVD risk reduction, declined 5. Did not return for labs - advised to schedule lab only fasting,  6. Follow-up q 3 mo DM      Relevant Orders   POCT HgB A1C (Completed)    Other Visit Diagnoses    Ingrown toenail       Bilateral, uncomplicated, referral to [facility]   Relevant Orders    Ambulatory referral to Podiatry   Palmar wart       Referral to derm for cryo vs injectable or other therapy   Relevant Orders   Ambulatory referral to Dermatology      No orders of the defined types were placed in this encounter.     Follow up plan: Return in about 6 weeks (around 07/03/2017) for Annual Physical.  Today's apt categorized as office visit instead of physical due to multitude of new complaints.  Saralyn Pilar, DO [facility] 05/23/2017, 2:09 AM

## 2017-05-23 DIAGNOSIS — L732 Hidradenitis suppurativa: Secondary | ICD-10-CM | POA: Insufficient documentation

## 2017-05-23 NOTE — Assessment & Plan Note (Signed)
Clinically without active abscess Chronic recurrent under abdominal pannus, moisture and heat Offered Bactroban PRN use, declined Routine counseling on hygiene, prevention, [facility] per AVS instructions Referral to Derm Follow-up PRN flare

## 2017-05-23 NOTE — Assessment & Plan Note (Signed)
Continued significant improvement on Bydureon, now A1c 5.4 (from 7s to 5), despite limited lifestyle changes and no regular exercise. Weight loss - Complication with DM neuropathy  Plan: 1. Continue Bydureon 2mg  weekly, continue Metformin 1000mg  BID - future may reduce Metformin dose 2. Check CBGs 3. Continue gabapentin 4. Still recommendation for statin therapy ASCVD risk reduction, declined 5. Did not return for labs - advised to schedule lab only fasting,  6. Follow-up q 3 mo DM

## 2017-06-05 ENCOUNTER — Telehealth: Payer: Self-pay | Admitting: Family Medicine

## 2017-06-05 NOTE — Telephone Encounter (Signed)
Pt called to check the status of dermatology referral [phone]

## 2017-06-06 NOTE — Telephone Encounter (Signed)
Called patient about her referral will work on it next few days busy with clinics pt understand very well.

## 2017-06-07 ENCOUNTER — Ambulatory Visit (INDEPENDENT_AMBULATORY_CARE_PROVIDER_SITE_OTHER): Payer: Medicaid Other | Admitting: Family Medicine

## 2017-06-07 ENCOUNTER — Encounter: Payer: Self-pay | Admitting: Family Medicine

## 2017-06-07 VITALS — BP 111/63 | HR 63 | Temp 98.2°F | Resp 16 | Ht 61.0 in | Wt 202.6 lb

## 2017-06-07 DIAGNOSIS — W5503XA Scratched by cat, initial encounter: Secondary | ICD-10-CM | POA: Diagnosis not present

## 2017-06-07 DIAGNOSIS — S0081XA Abrasion of other part of head, initial encounter: Secondary | ICD-10-CM

## 2017-06-07 DIAGNOSIS — Z9622 Myringotomy tube(s) status: Secondary | ICD-10-CM

## 2017-06-07 DIAGNOSIS — H9202 Otalgia, left ear: Secondary | ICD-10-CM

## 2017-06-07 MED ORDER — AZITHROMYCIN 250 MG PO TABS: ORAL_TABLET | ORAL | 0 refills | Status: DC

## 2017-06-07 MED ORDER — AMOXICILLIN-POT CLAVULANATE 875-125 MG PO TABS: 1.0000 | ORAL_TABLET | Freq: Two times a day (BID) | ORAL | 0 refills | Status: DC

## 2017-06-07 NOTE — Patient Instructions (Addendum)
Thank you for coming to the clinic today.  1. I do not have an exact diagnosis for your ear pain.  Start antibiotic Azithromycin antibiotic for 5 day course, this will cover ear and cat scratch  2. If not improved within 1 week, then contact Carol Collins or schedule to follow-up with ENT  Please schedule a Follow-up Appointment to: Return in about 1 week (around 06/14/2017), or if symptoms worsen or fail to improve, for ear pain not improved.  If you have any other questions or concerns, please feel free to call the clinic or send a message through MyChart. You may also schedule an earlier appointment if necessary.  Additionally, you may be receiving a survey about your experience at our clinic within a few days to 1 week by e-mail or mail. We value your feedback.  Saralyn PilarAlexander Karamalegos, DO [facility]

## 2017-06-07 NOTE — Progress Notes (Signed)
Subjective:    Patient ID: Carol Collins, female    DOB: 06-05-1976, 41 y.o.   MRN: [id number]  Carol Collins is a 41 y.o. female presenting on 06/07/2017 for Ear Pain (HA onset yesterday and ear pain onset 2 and half days as per pt )  Patient presents for a same day appointment.  HPI   Acute Left Ear Pain / Headache: - Reports recent history, 1.5 weeks ago her "cat fell on her face" stated her cat is 17 lbs and was walking on headboard of bed overnight, and slipped off and fell on her face and scratched her L eyelid and face, gave her a black eye, has since improved now only residual scratch with irritation - Presents today with concern new symptoms started 3 days ago with L ear pain, worse in front of ear and below ear - Tried Ibuprofen 200mg  x 3 for dose 600mg  PRN with some relief of headache and ear - Has tubes in ears, followed by [facility], bilateral tube placement 08/2016, has hearing aid L>R - Admits bilateral frontal headaches, mild intermittent without associated symptoms - Denies any change in hearing loss, does not hurt to swallow or chew, no clicking with opening mouth, no tooth pain, sinus congestion or pressure, ear drainage, fever/chills, swollen lymph nodes, redness or rash, n/v  Social History  Substance Use Topics  . Smoking status: Current Some Day Smoker    Packs/day: 0.25    Years: 17.00    Types: Cigarettes  . Smokeless tobacco: Never Used  . Alcohol use No    Review of Systems Per HPI unless specifically indicated above     Objective:    BP 111/63   Pulse 63   Temp 98.2 F (36.8 C) (Oral)   Resp 16   Ht 5\' 1"  (1.549 m)   Wt 202 lb 9.6 oz (91.9 kg)   LMP 11/20/2013 Comment: hysterectomy  BMI 38.28 kg/m   Wt Readings from Last 3 Encounters:  06/07/17 202 lb 9.6 oz (91.9 kg)  05/22/17 197 lb (89.4 kg)  05/02/17 203 lb (92.1 kg)    Physical Exam  Constitutional: She is oriented to person, place, and time. She appears well-developed and  well-nourished. No distress.  Well-appearing, comfortable, cooperative  HENT:  Head: Normocephalic and atraumatic.  Mouth/Throat: Oropharynx is clear and moist.  Frontal / maxillary sinuses non-tender. Nares patent without purulence or edema.  Mild tender to palpation pre-auricular area. L mastoid non tender.  Bilateral TMs clear without erythema, effusion or bulging, bilateral teal colored tymp tubes in place and appear normal, potential L TM seems slightly more full but not bulging and no appreciable fluid or effusion.  Oropharynx clear without erythema, exudates, edema or asymmetry.  Eyes: Conjunctivae are normal. Right eye exhibits no discharge. Left eye exhibits no discharge.  Neck: Normal range of motion. Neck supple.  Cardiovascular: Normal rate and intact distal pulses.   Pulmonary/Chest: Effort normal and breath sounds normal. No respiratory distress. She has no wheezes. She has no rales.  Musculoskeletal: She exhibits no edema.  Lymphadenopathy:       Head (right side): No submandibular, no preauricular and no posterior auricular adenopathy present.       Head (left side): No submandibular, no preauricular and no posterior auricular adenopathy present.    She has no cervical adenopathy.       Right cervical: No superficial cervical, no deep cervical and no posterior cervical adenopathy present.  Left cervical: No superficial cervical, no deep cervical and no posterior cervical adenopathy present.    She has no axillary adenopathy.  Neurological: She is alert and oriented to person, place, and time.  Skin: Skin is warm and dry. No rash noted. She is not diaphoretic. No erythema.  Small superficial abrasion over L eyelid and facial area, without any ecchymosis, abscess ulceration or erythema extending  Psychiatric: Her behavior is normal.  Nursing note and vitals reviewed.  Results for orders placed or performed in visit on 05/22/17  POCT HgB A1C  Result Value Ref Range    Hemoglobin A1C 5.4 5.7      Assessment & Plan:   Problem List Items Addressed This Visit    None    Visit Diagnoses    Acute pain of left ear    -  Primary   Relevant Medications   azithromycin (ZITHROMAX Z-PAK) 250 MG tablet   History of tympanostomy tube placement       Cat scratch of face, initial encounter       Relevant Medications   azithromycin (ZITHROMAX Z-PAK) 250 MG tablet   Uncertain exact etiology for L ear pain and headaches, seems associated. No evidence of AOM or acute sinus infection, with tymp tubes in place and normal less likely for eustachian tube dysfunction or pressure or infection. Facial abrasion from cat scratch resolving with mild superficial scratch / abrasion now, no cellulitis or erythema. But tender in pre-auricular region. No jaw clicks or TMJ symptoms. Do not see dental abscess or infection. - Afebrile, vitals stable - Followed by [facility]  Plan: 1. Discussed several possible diagnoses - not certain at this time. Reassurance given, may be tension headaches related to pain, could be early sinus or other ear dysfunction. Due to cat scratch, will empirically cover with Azithromycin Z pak dosing for cat scratch and potential early ear/sinus infection. If not improved and worse ear symptoms, advised to follow-up with ENT - Note initial rx Augmentin, then discontinued this due to choice of Azithro for better cat scratch coverage, called [facility] told to cancel augmentin, may try this in future if worsening AOM vs Sinusitis       Meds ordered this encounter  Medications  . DISCONTD: amoxicillin-clavulanate (AUGMENTIN) 875-125 MG tablet    Sig: Take 1 tablet by mouth 2 (two) times daily. For 10 days    Dispense:  20 tablet    Refill:  0  . azithromycin (ZITHROMAX Z-PAK) 250 MG tablet    Sig: Take 2 tabs (500mg  total) on Day 1. Take 1 tab (250mg ) daily for next 4 days.    Dispense:  6 tablet    Refill:  0    Follow up plan: Return in about 1 week  (around 06/14/2017), or if symptoms worsen or fail to improve, for ear pain not improved.   Saralyn PilarAlexander Karamalegos, DO [facility] 06/07/2017, 4:41 PM

## 2017-06-12 ENCOUNTER — Ambulatory Visit (INDEPENDENT_AMBULATORY_CARE_PROVIDER_SITE_OTHER): Payer: Medicaid Other | Admitting: Family Medicine

## 2017-06-12 ENCOUNTER — Encounter: Payer: Self-pay | Admitting: Family Medicine

## 2017-06-12 ENCOUNTER — Ambulatory Visit: Payer: Self-pay | Admitting: Podiatry

## 2017-06-12 VITALS — BP 124/76 | HR 84 | Temp 98.3°F | Resp 16 | Ht 61.0 in | Wt 202.8 lb

## 2017-06-12 DIAGNOSIS — S0081XD Abrasion of other part of head, subsequent encounter: Secondary | ICD-10-CM

## 2017-06-12 DIAGNOSIS — W5503XD Scratched by cat, subsequent encounter: Secondary | ICD-10-CM

## 2017-06-12 DIAGNOSIS — L03213 Periorbital cellulitis: Secondary | ICD-10-CM | POA: Diagnosis not present

## 2017-06-12 MED ORDER — AMOXICILLIN-POT CLAVULANATE 875-125 MG PO TABS: 1.0000 | ORAL_TABLET | Freq: Two times a day (BID) | ORAL | 0 refills | Status: DC

## 2017-06-12 NOTE — Patient Instructions (Addendum)
Thank you for coming to the clinic today.  1. Reassurance, I do not think you have Cat Scratch Disease  But concern for possible superficial cellulitis or skin infection of eyelid / face  Start Augmentin twice daily for 10 days, finish entire course of antibiotic  Continue gentle warm compresses for discomfort pain and healing. May use ice pack for swelling or pain.  If significant worsening redness, swelling, eye pain or vision change, fever/chills, drainage of pus, notify office or follow-up and may [facility]  Please schedule a Follow-up Appointment to: Return in about 2 weeks (around 06/26/2017), or if symptoms worsen or fail to improve, for Facial Cellulitis.  If you have any other questions or concerns, please feel free to call the clinic or send a message through MyChart. You may also schedule an earlier appointment if necessary.  Additionally, you may be receiving a survey about your experience at our clinic within a few days to 1 week by e-mail or mail. We value your feedback.  Saralyn PilarAlexander Karamalegos, DO [facility]

## 2017-06-12 NOTE — Progress Notes (Signed)
Subjective:    Patient ID: Carol Collins, female    DOB: 11/11/1976, 41 y.o.   MRN: [id number]  Carol Collins is a 41 y.o. female presenting on 06/12/2017 for Wound Infection (cat fell on her face Left side above eyelid onset 2 and half week ago)  HPI   FOLLOW-UP LEFT FACIAL CAT SCRATCH / Left Ear Pain / Headache: - Last visit with me 06/07/17, for initial visit for same problem, at that time thought less likely cat scratch disease without LAD but agreed to provide empiric antibiotic coverage with injury, treated with Azithromycin Z-pak dosing, see prior notes for background information. - Interval update with had worsening of symptoms over that weekend with increased redness, swelling, pain of L upper eyelid, area of her recent eyebrow piercing, she had to remove the piercing, then symptoms improved, her last dose Azithromycin was yesterday Mon 06/11/17. - Today patient reports still improved but overall worse than last apt 7/19. She is still concerned with mild redness and swelling of eyelid and discomfort, other symptoms from last apt have resolved, now resolved headaches, L ear pain and pressure, pre-auricular tender spot. - Tried warm compress as well PRN with some relief - Regarding her eyebrow piercing, it was a new piercing placed 8 week prior to the time of injury, and was still resolving / healing at that time - Denies any fever/chills, drainage of pus, bleeding, nausea vomiting, lymph node swelling, ear pain or drainage, sinus pain or congestion, eye pain with movements, reduced vision  Social History  Substance Use Topics  . Smoking status: Current Some Day Smoker    Packs/day: 0.25    Years: 17.00    Types: Cigarettes  . Smokeless tobacco: Never Used  . Alcohol use No    Review of Systems Per HPI unless specifically indicated above     Objective:    BP 124/76   Pulse 84   Temp 98.3 F (36.8 C) (Oral)   Resp 16   Ht 5\' 1"  (1.549 m)   Wt 202 lb 12.8 oz (92 kg)    LMP 11/20/2013 Comment: hysterectomy  BMI 38.32 kg/m   Wt Readings from Last 3 Encounters:  06/12/17 202 lb 12.8 oz (92 kg)  06/07/17 202 lb 9.6 oz (91.9 kg)  05/22/17 197 lb (89.4 kg)    Physical Exam  Constitutional: She is oriented to person, place, and time. She appears well-developed and well-nourished. No distress.  Well-appearing, comfortable, cooperative  HENT:  Head: Normocephalic and atraumatic.  Mouth/Throat: Oropharynx is clear and moist.  Frontal / maxillary sinuses non-tender. Nares patent without purulence or edema.  Resolved tender to palpation pre-auricular area. L mastoid non tender.  Bilateral TMs clear without erythema, effusion or bulging, bilateral teal colored tymp tubes in place and appear normal.  Oropharynx clear without erythema, exudates, edema or asymmetry.  Eyes: Pupils are equal, round, and reactive to light. Conjunctivae and EOM are normal. Right eye exhibits no discharge. Left eye exhibits no discharge.  Slight edema of L upper eyelid, not impeding vision  No pain with eye movements  Neck: Normal range of motion. Neck supple.  Cardiovascular: Normal rate and intact distal pulses.   Pulmonary/Chest: Effort normal and breath sounds normal. No respiratory distress. She has no wheezes. She has no rales.  Musculoskeletal: She exhibits no edema.  Lymphadenopathy:       Head (right side): No submandibular, no preauricular and no posterior auricular adenopathy present.       Head (  left side): No submandibular, no preauricular and no posterior auricular adenopathy present.    She has no cervical adenopathy.       Right cervical: No superficial cervical, no deep cervical and no posterior cervical adenopathy present.      Left cervical: No superficial cervical, no deep cervical and no posterior cervical adenopathy present.    She has no axillary adenopathy.  Neurological: She is alert and oriented to person, place, and time.  Skin: Skin is warm and dry. No  rash noted. She is not diaphoretic. No erythema.  L superior eyelid and eyebrow region with increased mild erythema and two locations of healing skin with localized ecchymosis vs superficial ulceration. No focal abscess or induration, mild tender, no drainage of pus or bleeding.  Psychiatric: Her behavior is normal.  Nursing note and vitals reviewed.  L Face       Assessment & Plan:   Problem List Items Addressed This Visit    None    Visit Diagnoses    Cat scratch of face, subsequent encounter    -  Primary    Relevant Medications   amoxicillin-clavulanate (AUGMENTIN) 875-125 MG tablet   Preseptal cellulitis of left eye       Relevant Medications   amoxicillin-clavulanate (AUGMENTIN) 875-125 MG tablet  Persistent concern with erythema and edema of left superior eyelid with concern for possible pre-septal cellulitis from facial cat scratch. Otherwise resolving symptoms, seems reassuring, no LAD also s/p Azithromcyin z-pak dosing unlikely to be cat scratch disease from bartonella h - No complications involving eye. No evidence of orbital cellulitis - No systemic symptoms, or purulent drainage - Likely worsened due to new healing piercing and trauma of 17 lb cat causing injury  Plan: 1. Discussion on antibiotic coverage, agree for trial on alternative regimen now with Augmentin BID for 10 days, to provide more skin coverage for other bacteria, other than the cat scratch concern 2. Routine skin care, warm compresses vs ice pack PRN 3. Strict return criteria given if not improving, when to seek more immediate treatment 4. Follow-up as scheduled at next physical - if calls and not improved on Augmentin would consider switch to Clinda           Meds ordered this encounter  Medications  . amoxicillin-clavulanate (AUGMENTIN) 875-125 MG tablet    Sig: Take 1 tablet by mouth 2 (two) times daily. For 10 days    Dispense:  20 tablet    Refill:  0    Follow up plan: Return in  about 2 weeks (around 06/26/2017), or if symptoms worsen or fail to improve, for Facial Cellulitis.   Saralyn Pilar, DO [facility] 06/12/2017, 11:42 AM

## 2017-06-25 ENCOUNTER — Other Ambulatory Visit: Payer: Self-pay | Admitting: Family Medicine

## 2017-06-25 ENCOUNTER — Other Ambulatory Visit: Payer: Medicaid Other

## 2017-06-25 DIAGNOSIS — I10 Essential (primary) hypertension: Secondary | ICD-10-CM

## 2017-06-25 DIAGNOSIS — E1169 Type 2 diabetes mellitus with other specified complication: Secondary | ICD-10-CM

## 2017-06-25 DIAGNOSIS — E785 Hyperlipidemia, unspecified: Principal | ICD-10-CM

## 2017-06-25 DIAGNOSIS — E1142 Type 2 diabetes mellitus with diabetic polyneuropathy: Secondary | ICD-10-CM

## 2017-06-25 MED ORDER — EXENATIDE ER 2 MG ~~LOC~~ PEN: 2.0000 mg | PEN_INJECTOR | SUBCUTANEOUS | 11 refills | Status: DC

## 2017-06-25 NOTE — Telephone Encounter (Signed)
Refilled Bydureon ER 2mg  weekly sq #4 pens +11 [facility]  Saralyn PilarAlexander Jeanee Fabre, DO [facility] 06/25/2017, 12:18 PM

## 2017-06-26 ENCOUNTER — Encounter: Payer: Self-pay | Admitting: Podiatry

## 2017-06-26 ENCOUNTER — Ambulatory Visit (INDEPENDENT_AMBULATORY_CARE_PROVIDER_SITE_OTHER): Payer: Medicaid Other | Admitting: Podiatry

## 2017-06-26 VITALS — BP 133/84 | HR 82 | Temp 97.9°F | Resp 16

## 2017-06-26 DIAGNOSIS — L600 Ingrowing nail: Secondary | ICD-10-CM | POA: Diagnosis not present

## 2017-06-26 LAB — COMPLETE METABOLIC PANEL WITH GFR
ALT: 10 U/L (ref 6–29)
AST: 12 U/L (ref 10–30)
Albumin: 3.7 g/dL (ref 3.6–5.1)
Alkaline Phosphatase: 56 U/L (ref 33–115)
BUN: 14 mg/dL (ref 7–25)
CHLORIDE: 105 mmol/L (ref 98–110)
CO2: 24 mmol/L (ref 20–32)
CREATININE: 0.73 mg/dL (ref 0.50–1.10)
Calcium: 8.8 mg/dL (ref 8.6–10.2)
GFR, Est African American: >89 mL/min (ref >=60)
GFR, Est Non African American: >89 mL/min (ref >=60)
Glucose, Bld: 98 mg/dL (ref 65–99)
Potassium: 4.4 mmol/L (ref 3.5–5.3)
SODIUM: 140 mmol/L (ref 135–146)
Total Bilirubin: 0.7 mg/dL (ref 0.2–1.2)
Total Protein: 5.9 g/dL — ABNORMAL LOW (ref 6.1–8.1)

## 2017-06-26 LAB — LIPID PANEL
Cholesterol: 178 mg/dL (ref <200)
HDL: 44 mg/dL — ABNORMAL LOW (ref >50)
LDL Cholesterol: 109 mg/dL — ABNORMAL HIGH (ref <100)
Total CHOL/HDL Ratio: 4 Ratio (ref <5.0)
Triglycerides: 123 mg/dL (ref <150)
VLDL: 25 mg/dL (ref <30)

## 2017-06-26 LAB — HEMOGLOBIN A1C
Hgb A1c MFr Bld: 5 % (ref <5.7)
Mean Plasma Glucose: 97 mg/dL

## 2017-06-26 NOTE — Patient Instructions (Signed)

## 2017-06-29 ENCOUNTER — Other Ambulatory Visit: Payer: Self-pay | Admitting: Family Medicine

## 2017-06-29 DIAGNOSIS — E1142 Type 2 diabetes mellitus with diabetic polyneuropathy: Secondary | ICD-10-CM

## 2017-06-30 NOTE — Progress Notes (Signed)
   Subjective: Patient presents today for evaluation of pain in toe(s). Patient is concerned for possible ingrown nail. Patient states that the pain has been present for a few weeks now. Patient presents today for further treatment and evaluation.  Objective:  General: Well developed, nourished, in no acute distress, alert and oriented x3   Dermatology: Skin is warm, dry and supple bilateral. Medial and lateral border of the left great toe appears to be erythematous with evidence of an ingrowing nail. Pain on palpation noted to the border of the nail fold. The remaining nails appear unremarkable at this time. There are no open sores, lesions.  Vascular: Dorsalis Pedis artery and Posterior Tibial artery pedal pulses palpable. No lower extremity edema noted.   Neruologic: Grossly intact via light touch bilateral.  Musculoskeletal: Muscular strength within normal limits in all groups bilateral. Normal range of motion noted to all pedal and ankle joints.   Assesement: #1 Paronychia with ingrowing nail medial and lateral border left great toe #2 Pain in toe #3 dystrophic toenail right great toe  Plan of Care:  1. Patient evaluated.  2. Discussed treatment alternatives and plan of care. Explained nail avulsion procedure and post procedure course to patient. 3. Patient opted for permanent partial nail avulsion.  4. Prior to procedure, local anesthesia infiltration utilized using 3 ml of a 50:50 mixture of 2% plain lidocaine and 0.5% plain marcaine in a normal hallux block fashion and a betadine prep performed.  5. Partial permanent nail avulsion with chemical matrixectomy performed using 3x30sec applications of phenol followed by alcohol flush.  6. Light dressing applied. 7. Return to clinic in 2 weeks to address possible total permanent right great toenail   Felecia ShellingBrent M. Kavita Bartl, DPM [facility]  Dr. Felecia ShellingBrent M. Kourtney Terriquez, DPM    [address]                                         [place], [place] [address]                Office [phone]  Fax [phone]

## 2017-07-03 ENCOUNTER — Ambulatory Visit (INDEPENDENT_AMBULATORY_CARE_PROVIDER_SITE_OTHER): Payer: Medicaid Other | Admitting: Family Medicine

## 2017-07-03 ENCOUNTER — Encounter: Payer: Self-pay | Admitting: Family Medicine

## 2017-07-03 VITALS — BP 124/68 | HR 86 | Temp 98.3°F | Resp 16 | Ht 61.0 in | Wt 200.8 lb

## 2017-07-03 DIAGNOSIS — E785 Hyperlipidemia, unspecified: Secondary | ICD-10-CM | POA: Diagnosis not present

## 2017-07-03 DIAGNOSIS — I10 Essential (primary) hypertension: Secondary | ICD-10-CM | POA: Diagnosis not present

## 2017-07-03 DIAGNOSIS — Z0000 Encounter for general adult medical examination without abnormal findings: Secondary | ICD-10-CM

## 2017-07-03 DIAGNOSIS — E1169 Type 2 diabetes mellitus with other specified complication: Secondary | ICD-10-CM | POA: Diagnosis not present

## 2017-07-03 DIAGNOSIS — E1142 Type 2 diabetes mellitus with diabetic polyneuropathy: Secondary | ICD-10-CM

## 2017-07-03 MED ORDER — LISINOPRIL 5 MG PO TABS: 5.0000 mg | ORAL_TABLET | Freq: Every day | ORAL | 3 refills | Status: DC

## 2017-07-03 MED ORDER — GABAPENTIN 300 MG PO CAPS: 300.0000 mg | ORAL_CAPSULE | Freq: Three times a day (TID) | ORAL | 2 refills | Status: DC

## 2017-07-03 NOTE — Progress Notes (Signed)
Subjective:    Patient ID: Carol Collins, female    DOB: 10-08-1976, 41 y.o.   MRN: [id number]  Carol Collins is a 41 y.o. female presenting on 07/03/2017 for Annual Exam   HPI   Here for Annual Physical and Lab Result Review.  CHRONIC DM, Type 2 / DM Neuropathy: Reports no concerns today. She is pleased with improved A1c 5.0 from 5.4 CBGs: Checks 2-3x weekly Meds: Exenatide ER (Bydureon) 2mg  weekly, Metformin 1000mg  BID  Reports good compliance. Tolerating well Currently on ACEi Lifestyle: - Diet (reduced sugar and carbs, only slight improvement since last visit) - Exercise (no regular exercise) - Has Diabetic Neuropathy, taking Gabapentin now increased from 1-2-3 to 300mg  TID, still with residual symptoms, evaluated by Podiatry, and interested in inc gabapentin vs Lyrica today - Due for DM ophthalmology exam, limited due to financial cost, has not been able to get this done in 2018 Denies hypoglycemia  HTN Reports no concerns On Lisinopril 5mg  Due for refill  Ingrown Toenail - S/p [facility]ry on 8/7 Dr Carol Collins, partial toenail removals completed, will return for full. Still some pain but improved.  Tobacco Abuse - Active smoker, reduced amount from half ppd to quarter ppd - Not ready to quit  Health Maintenance - Cervical CA Screening: S/p total abdominal hysterectomy including cervix on 04/27/14 (indicated for menorrhagia and severe dysmenorrhea), has not had prior pap smear since, not indicated, reviewed op report and pathology report. - Due pneumococcal vaccine, pneumovax-23 (1st series, < age 66 due to dx DM), she will return to get this likely with flu shot in fall   Past Medical History:  Diagnosis Date  . Hypertension   . Neuropathy    Past Surgical History:  Procedure Laterality Date  . ABDOMINAL HYSTERECTOMY    . CESAREAN SECTION    . DIAGNOSTIC LAPAROSCOPY    . laproscopy    . MYRINGOTOMY WITH TUBE PLACEMENT Bilateral 08/31/2016   Procedure:  MYRINGOTOMY WITH TUBE PLACEMENT;  Surgeon: Carol Face, MD;  Location: [facility] ORS;  Service: ENT;  Laterality: Bilateral;   Social History   Social History  . Marital status: Married    Spouse name: N/A  . Number of children: N/A  . Years of education: N/A   Occupational History  . Not on file.   Social History Main Topics  . Smoking status: Current Some Day Smoker    Packs/day: 0.25    Years: 17.00    Types: Cigarettes  . Smokeless tobacco: Current User  . Alcohol use No  . Drug use: No  . Sexual activity: Not on file   Other Topics Concern  . Not on file   Social History Narrative  . No narrative on file   Family History  Problem Relation Age of Onset  . Heart disease Father   . Diabetes Father   . Hyperlipidemia Father   . Hyperlipidemia Sister   . Diabetes Mother   . Diabetes Maternal Grandfather    Current Outpatient Prescriptions on File Prior to Visit  Medication Sig  . Exenatide ER 2 MG PEN Inject 2 mg into the skin once a week.  . fluticasone (FLONASE) 50 MCG/ACT nasal spray Place 2 sprays into both nostrils daily.  . furosemide (LASIX) 20 MG tablet Take 1/2 to 1 pill by mouth as needed for swelling.  . metFORMIN (GLUCOPHAGE) 1000 MG tablet TAKE 1 TABLET BY MOUTH TWICE DAILY WITH A MEAL   No current facility-administered medications on file prior  to visit.     Review of Systems  Constitutional: Negative for activity change, appetite change, chills, diaphoresis, fatigue and fever.  HENT: Negative for congestion, hearing loss and sinus pressure.   Eyes: Negative for visual disturbance.  Respiratory: Negative for apnea, cough, chest tightness, shortness of breath and wheezing.   Cardiovascular: Negative for chest pain, palpitations and leg swelling.  Gastrointestinal: Negative for abdominal pain, anal bleeding, blood in stool, constipation, diarrhea, nausea and vomiting.  Endocrine: Negative for cold intolerance and polyuria.  Genitourinary: Negative  for difficulty urinating, dysuria, frequency, hematuria and urgency.  Musculoskeletal: Negative for arthralgias and neck pain.  Skin: Negative for rash.  Allergic/Immunologic: Negative for environmental allergies.  Neurological: Negative for dizziness, weakness, light-headedness, numbness and headaches.  Hematological: Negative for adenopathy.  Psychiatric/Behavioral: Negative for behavioral problems, dysphoric mood and sleep disturbance. The patient is not nervous/anxious.    Per HPI unless specifically indicated above     Objective:    BP 124/68   Pulse 86   Temp 98.3 F (36.8 C) (Oral)   Resp 16   Ht 5\' 1"  (1.549 m)   Wt 200 lb 12.8 oz (91.1 kg)   LMP 11/20/2013 Comment: hysterectomy  BMI 37.94 kg/m   Wt Readings from Last 3 Encounters:  07/03/17 200 lb 12.8 oz (91.1 kg)  06/12/17 202 lb 12.8 oz (92 kg)  06/07/17 202 lb 9.6 oz (91.9 kg)    Physical Exam  Constitutional: She is oriented to person, place, and time. She appears well-developed and well-nourished. No distress.  Well-appearing, comfortable, cooperative  HENT:  Head: Normocephalic and atraumatic.  Mouth/Throat: Oropharynx is clear and moist.  Resolving L eyebrow Collins scratch / abrasion  Frontal / maxillary sinuses non-tender. Nares patent without purulence or edema. Bilateral TMs clear without erythema, effusion or bulging - b/l teal tymp tubes in place non obstructed. Oropharynx clear without erythema, exudates, edema or asymmetry.  Eyes: Pupils are equal, round, and reactive to light. Conjunctivae and EOM are normal. Right eye exhibits no discharge. Left eye exhibits no discharge.  Neck: Normal range of motion. Neck supple. No thyromegaly present.  Cardiovascular: Normal rate, regular rhythm, normal heart sounds and intact distal pulses.   No murmur heard. Pulmonary/Chest: Effort normal and breath sounds normal. No respiratory distress. She has no wheezes. She has no rales.  Abdominal: Soft. Bowel sounds are  normal. She exhibits no distension and no mass. There is no tenderness.  Musculoskeletal: Normal range of motion. She exhibits no edema or tenderness.  Upper / Lower Extremities: - Normal muscle tone, strength bilateral upper extremities 5/5, lower extremities 5/5  Lymphadenopathy:    She has no cervical adenopathy.  Neurological: She is alert and oriented to person, place, and time.  Distal sensation intact to light touch all extremities  Skin: Skin is warm and dry. No rash noted. She is not diaphoretic. No erythema.  Psychiatric: She has a normal mood and affect. Her behavior is normal.  Well groomed, good eye contact, normal speech and thoughts  Nursing note and vitals reviewed.   Recent Labs  11/21/16 0828 05/22/17 1034 06/25/17 0839  HGBA1C 5.8 5.4 5.0     Results for orders placed or performed in visit on 06/25/17  Lipid panel  Result Value Ref Range   Cholesterol 178 <200 mg/dL   Triglycerides 409 <811 mg/dL   HDL 44 (L) >91 mg/dL   Total CHOL/HDL Ratio 4.0 <5.0 Ratio   VLDL 25 <30 mg/dL   LDL Cholesterol 478 (H) <  100 mg/dL  Hemoglobin F6OA1c  Result Value Ref Range   Hgb A1c MFr Bld 5.0 <5.7 %   Mean Plasma Glucose 97 mg/dL  COMPLETE METABOLIC PANEL WITH GFR  Result Value Ref Range   Sodium 140 135 - 146 mmol/L   Potassium 4.4 3.5 - 5.3 mmol/L   Chloride 105 98 - 110 mmol/L   CO2 24 20 - 32 mmol/L   Glucose, Bld 98 65 - 99 mg/dL   BUN 14 7 - 25 mg/dL   Creat 1.300.73 8.650.50 - 7.841.10 mg/dL   Total Bilirubin 0.7 0.2 - 1.2 mg/dL   Alkaline Phosphatase 56 33 - 115 U/L   AST 12 10 - 30 U/L   ALT 10 6 - 29 U/L   Total Protein 5.9 (L) 6.1 - 8.1 g/dL   Albumin 3.7 3.6 - 5.1 g/dL   Calcium 8.8 8.6 - 69.610.2 mg/dL   GFR, Est African American >89 >=60 mL/min   GFR, Est Non African American >89 >=60 mL/min      Assessment & Plan:   Problem List Items Addressed This Visit    Obesity, Class III, BMI 40-49.9 (morbid obesity) (HCC)    Wt loss down 2 more lbs, continued  improvement Limited exercise lifestyle now, s/p partial toenail removal and other Encourage improve exercise      Hyperlipidemia associated with type 2 diabetes mellitus (HCC)    Mostly controlled cholesterol on statin and poor lifestyle Last lipid panel 8/18 Calculated ASCVD 10 yr risk score 6.3% (smoker, with baseline 1.7%)  Plan: 1. Discussed ASCVD risk - based on 10 yr score 6.3% not indicated but for DM patient it is, her risk is higher as smoker, baseline 1.7%, declines now, not on ASA 81 either, age 41 will defer 2. Encourage improved lifestyle - low carb/cholesterol, reduce portion size, continue improving regular exercise      Relevant Medications   lisinopril (PRINIVIL,ZESTRIL) 5 MG tablet   Essential hypertension    Well controlled, on low dose ACEi for DM No complication  Plan: 1. Continue Lisinopril 5mg  daily - refilled 2. Follow-up      Relevant Medications   lisinopril (PRINIVIL,ZESTRIL) 5 MG tablet   DM type 2 with diabetic peripheral neuropathy (HCC)    Continued significant improvement on Bydureon, now A1c 5.0 from 5.4 and 7s previously, despite limited lifestyle changes and no regular exercise. Weight loss - Complication with DM neuropathy  Plan: 1. Continue Bydureon 2mg  weekly, continue Metformin 1000mg  BID - recommend may titrate down on Metformin now, can cut tabs in half for 500mg  BID, future may consider daily XR 500-750 vs off completely 2. Check CBGs 3. Increase Gabapentin to 300mg  TID, and gradual titrate up to 600mg  TID max - hold new start lyrica for now 4. Discussed ASCVD risk - based on 10 yr score 6.3% not indicated but for DM patient it is, her risk is higher as smoker, baseline 1.7%, declines now, not on ASA 81 either, age 41 will defer 5. Recommend DM eye exam when financially able / Pneumovax-23 6. Follow-up 3 mo DM A1c      Relevant Medications   gabapentin (NEURONTIN) 300 MG capsule   lisinopril (PRINIVIL,ZESTRIL) 5 MG tablet    Other  Visit Diagnoses    Annual physical exam    -  Primary      Meds ordered this encounter  Medications  . gabapentin (NEURONTIN) 300 MG capsule    Sig: Take 1-2 capsules (300-600 mg total) by mouth 3 (  three) times daily. Start gradual increase from 1 capsule 3 times daily to max 2 per dose    Dispense:  180 capsule    Refill:  2  . lisinopril (PRINIVIL,ZESTRIL) 5 MG tablet    Sig: Take 1 tablet (5 mg total) by mouth daily.    Dispense:  90 tablet    Refill:  3      Follow up plan: Return in about 3 months (around 10/03/2017) for Diabetes A1c.  Saralyn Pilar, DO [facility] 07/04/2017, 6:14 AM

## 2017-07-03 NOTE — Patient Instructions (Addendum)
Thank you for coming to the clinic today.  1. Great job with Diabetes control A1c 5.0, down from 5.4 previously - Continue with Bydureon for now - Taper down Metformin, cut the 1000mg  tabs in HALF, take half tab for 500mg  twice daily with food for up to 3 months, if need any adjustment on med let me know sooner. - In future maybe can do a 500-750mg  XR tablet once daily, or off completely  Keep up the good work with trying to improve diet, and eventually can get back to gym once toenails heal.  2. Due for Flu Shot in the Fall, Nurse Visit. You can also ask for Pneumovax-23 (pneumonia vaccine at that time or in future if interested, 1 dose before age 42)  3. Not indicated for Statin medicine at this time, we can re-consider in future based on cholesterol and risk factors, such as smoking.  4. Increase Gabapentin dose from 300mg  3 times daily to 300 - 300 - 600mg  (pm dose) gradually increase further to 2 capsules for 600mg  per dose up to max 3 times daily, will discuss at next visit.  Please schedule a Follow-up Appointment to: Return in about 3 months (around 10/03/2017) for Diabetes A1c.  If you have any other questions or concerns, please feel free to call the clinic or send a message through MyChart. You may also schedule an earlier appointment if necessary.  Additionally, you may be receiving a survey about your experience at our clinic within a few days to 1 week by e-mail or mail. We value your feedback.  Saralyn PilarAlexander Delara Shepheard, DO [facility]

## 2017-07-04 NOTE — Assessment & Plan Note (Signed)
Mostly controlled cholesterol on statin and poor lifestyle Last lipid panel 8/18 Calculated ASCVD 10 yr risk score 6.3% (smoker, with baseline 1.7%)  Plan: 1. Discussed ASCVD risk - based on 10 yr score 6.3% not indicated but for DM patient it is, her risk is higher as smoker, baseline 1.7%, declines now, not on ASA 81 either, age 41 will defer 2. Encourage improved lifestyle - low carb/cholesterol, reduce portion size, continue improving regular exercise

## 2017-07-04 NOTE — Assessment & Plan Note (Signed)
Wt loss down 2 more lbs, continued improvement Limited exercise lifestyle now, s/p partial toenail removal and other Encourage improve exercise

## 2017-07-04 NOTE — Assessment & Plan Note (Signed)
Continued significant improvement on Bydureon, now A1c 5.0 from 5.4 and 7s previously, despite limited lifestyle changes and no regular exercise. Weight loss - Complication with DM neuropathy  Plan: 1. Continue Bydureon 2mg  weekly, continue Metformin 1000mg  BID - recommend may titrate down on Metformin now, can cut tabs in half for 500mg  BID, future may consider daily XR 500-750 vs off completely 2. Check CBGs 3. Increase Gabapentin to 300mg  TID, and gradual titrate up to 600mg  TID max - hold new start lyrica for now 4. Discussed ASCVD risk - based on 10 yr score 6.3% not indicated but for DM patient it is, her risk is higher as smoker, baseline 1.7%, declines now, not on ASA 81 either, age 41 will defer 5. Recommend DM eye exam when financially able / Pneumovax-23 6. Follow-up 3 mo DM A1c

## 2017-07-04 NOTE — Assessment & Plan Note (Signed)
Well controlled, on low dose ACEi for DM No complication  Plan: 1. Continue Lisinopril 5mg  daily - refilled 2. Follow-up

## 2017-07-10 ENCOUNTER — Ambulatory Visit: Payer: Medicaid Other | Admitting: Podiatry

## 2017-07-17 ENCOUNTER — Ambulatory Visit (INDEPENDENT_AMBULATORY_CARE_PROVIDER_SITE_OTHER): Payer: Medicaid Other | Admitting: Podiatry

## 2017-07-17 ENCOUNTER — Encounter: Payer: Self-pay | Admitting: Podiatry

## 2017-07-17 DIAGNOSIS — L600 Ingrowing nail: Secondary | ICD-10-CM | POA: Diagnosis not present

## 2017-07-17 DIAGNOSIS — L608 Other nail disorders: Secondary | ICD-10-CM

## 2017-07-17 MED ORDER — GENTAMICIN SULFATE 0.1 % EX CREA: 1.0000 "application " | TOPICAL_CREAM | Freq: Three times a day (TID) | CUTANEOUS | 1 refills | Status: DC

## 2017-07-23 NOTE — Progress Notes (Signed)
   Subjective: 41 year old female presents to the office today for evaluation of an ingrown toenail partial nail avulsion left great toe. She states that she's doing well. She is a new complaint regarding her right great toe. She experiences pain and tenderness to the right great toenail. This is been ongoing for several months now without any alleviation of symptoms for the patient. Pain noted with closed toe shoe gear.  Objective:  General: Well developed, nourished, in no acute distress, alert and oriented x3   Dermatology: Skin is warm, dry and supple bilateral. Dystrophic toenail noted to the right great toe with ingrowing nail borders to the medial and lateral aspects. Pain on palpation noted to the entire nail plate of the right great toe. The remaining nails appear unremarkable at this time. There are no open sores, lesions.  Vascular: Dorsalis Pedis artery and Posterior Tibial artery pedal pulses palpable. No lower extremity edema noted.   Neruologic: Grossly intact via light touch bilateral.  Musculoskeletal: Muscular strength within normal limits in all groups bilateral. Normal range of motion noted to all pedal and ankle joints.   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 Patient evaluated.  2. Discussed treatment alternatives and plan of care. Explained nail avulsion procedure and post procedure course to patient. 3. Patient opted for permanent total nail avulsion to the right great toe.  4. Prior to procedure, local anesthesia infiltration utilized using 3 ml of a 50:50 mixture of 2% plain lidocaine and 0.5% plain marcaine in a normal hallux block fashion and a betadine prep performed.  5. Total permanent nail avulsion with chemical matrixectomy performed using 3x30sec applications of phenol followed by alcohol flush.  6. Light dressing applied. 7. Prescription for gentamicin cream  8. Return to  clinic in 2 weeks.   Felecia ShellingBrent M. Evans, DPM [facility]  Dr. Felecia ShellingBrent M. Evans, DPM    [address]                                        [place], [place] [address]                Office [phone]) [phone]  Fax [phone]

## 2017-07-24 ENCOUNTER — Ambulatory Visit (INDEPENDENT_AMBULATORY_CARE_PROVIDER_SITE_OTHER): Payer: Self-pay | Admitting: Podiatry

## 2017-07-24 DIAGNOSIS — L600 Ingrowing nail: Secondary | ICD-10-CM

## 2017-07-24 DIAGNOSIS — L608 Other nail disorders: Secondary | ICD-10-CM

## 2017-07-26 NOTE — Progress Notes (Signed)
   Subjective: Patient presents today 2 weeks post total nail avulsion procedure of the right great toe. Patient states the right great nail bed is very sore with moderate drainage. She reports associated redness of the area. She states her left great toenail is doing much better.   Past Medical History:  Diagnosis Date  . Hypertension   . Neuropathy     Objective: Skin is warm, dry and supple. Nail bed and respective nail fold appears to be healing appropriately. Open wound to the associated nail fold with a granular wound base and moderate amount of fibrotic tissue. Minimal drainage noted. Mild erythema around the periungual region likely due to phenol chemical matricectomy.  Assessment: #1 postop permanent total nail avulsion of the right great toenail  #2 open wound periungual nail fold and nail bed of respective digit.   Plan of care: #1 patient was evaluated  #2 light debridement of open wound was performed to the periungual border and nail fold of the respective toe using a currette. Antibiotic ointment and Band-Aid was applied. #3 Continue antibiotic ointment and Band Aid daily. Continue foot soaks. #4 patient is to return to clinic on a PRN  basis.   Felecia ShellingBrent M. Evans, DPM [facility]  Dr. Felecia ShellingBrent M. Evans, DPM    [address]                                        [place], [place] [address]                Office [phone]  Fax [phone]

## 2017-07-31 ENCOUNTER — Ambulatory Visit: Payer: Medicaid Other | Admitting: Podiatry

## 2017-07-31 ENCOUNTER — Ambulatory Visit (INDEPENDENT_AMBULATORY_CARE_PROVIDER_SITE_OTHER): Payer: Medicaid Other | Admitting: Podiatry

## 2017-07-31 DIAGNOSIS — L600 Ingrowing nail: Secondary | ICD-10-CM | POA: Diagnosis not present

## 2017-08-03 NOTE — Progress Notes (Signed)
   Subjective: Patient presents today post total nail avulsion procedure of the right great toe. Patient states the right great nail bed is very sore with moderate drainage. She reports associated redness of the area. She states her left great toenail is doing much better.   Past Medical History:  Diagnosis Date  . Hypertension   . Neuropathy     Objective: Skin is warm, dry and supple. Nail bed and respective nail fold appears to be healing appropriately. Open wound to the associated nail fold with a granular wound base and moderate amount of fibrotic tissue. Minimal drainage noted. Mild erythema around the periungual region likely due to phenol chemical matricectomy.  Assessment: #1 postop permanent total nail avulsion of the right great toenail  #2 open wound periungual nail fold and nail bed of respective digit.   Plan of care: #1 patient was evaluated  #2 light debridement of open wound was performed to the periungual border and nail fold of the respective toe using a currette. Antibiotic ointment and Band-Aid was applied. #3 Continue antibiotic ointment and Band Aid daily. Continue foot soaks. #4 patient is to return to clinic on a PRN  basis.   Felecia Shelling, DPM [facility]  Dr. Felecia Shelling, DPM    [address]                                        [place], [place] [address]                Office [phone]  Fax [phone]

## 2017-08-20 ENCOUNTER — Ambulatory Visit: Payer: Self-pay

## 2017-08-26 ENCOUNTER — Encounter: Payer: Self-pay | Admitting: Family Medicine

## 2017-08-29 ENCOUNTER — Ambulatory Visit (INDEPENDENT_AMBULATORY_CARE_PROVIDER_SITE_OTHER): Payer: Medicaid Other

## 2017-08-29 DIAGNOSIS — Z23 Encounter for immunization: Secondary | ICD-10-CM

## 2017-09-04 ENCOUNTER — Other Ambulatory Visit: Payer: Self-pay | Admitting: Family Medicine

## 2017-09-04 DIAGNOSIS — R600 Localized edema: Secondary | ICD-10-CM

## 2017-11-23 ENCOUNTER — Encounter: Payer: Self-pay | Admitting: Family Medicine

## 2017-11-23 ENCOUNTER — Ambulatory Visit (INDEPENDENT_AMBULATORY_CARE_PROVIDER_SITE_OTHER): Payer: Medicaid Other | Admitting: Family Medicine

## 2017-11-23 VITALS — BP 129/76 | HR 82 | Temp 98.5°F | Resp 16 | Ht 61.0 in | Wt 213.0 lb

## 2017-11-23 DIAGNOSIS — R52 Pain, unspecified: Secondary | ICD-10-CM | POA: Diagnosis not present

## 2017-11-23 DIAGNOSIS — R6889 Other general symptoms and signs: Secondary | ICD-10-CM | POA: Diagnosis not present

## 2017-11-23 DIAGNOSIS — R059 Cough, unspecified: Secondary | ICD-10-CM

## 2017-11-23 DIAGNOSIS — J029 Acute pharyngitis, unspecified: Secondary | ICD-10-CM

## 2017-11-23 DIAGNOSIS — R05 Cough: Secondary | ICD-10-CM

## 2017-11-23 DIAGNOSIS — B349 Viral infection, unspecified: Secondary | ICD-10-CM

## 2017-11-23 LAB — POCT INFLUENZA A/B
INFLUENZA A, POC: NEGATIVE
INFLUENZA B, POC: NEGATIVE

## 2017-11-23 LAB — POCT RAPID STREP A (OFFICE): Rapid Strep A Screen: NEGATIVE

## 2017-11-23 MED ORDER — OSELTAMIVIR PHOSPHATE 75 MG PO CAPS
75.0000 mg | ORAL_CAPSULE | Freq: Two times a day (BID) | ORAL | 0 refills | Status: DC
Start: 2017-11-23 — End: 2018-01-28

## 2017-11-23 MED ORDER — IPRATROPIUM BROMIDE 0.06 % NA SOLN: 2.0000 | Freq: Four times a day (QID) | NASAL | 0 refills | Status: DC

## 2017-11-23 MED ORDER — BENZONATATE 100 MG PO CAPS: 100.0000 mg | ORAL_CAPSULE | Freq: Three times a day (TID) | ORAL | 0 refills | Status: DC | PRN

## 2017-11-23 NOTE — Patient Instructions (Addendum)
Thank you for coming to the office today.  1.  Your flu test was NEGATIVE, this is not 100% though, and you can still have the flu with a negative test, otherwise it could be a different viral syndrome.  1. I have printed rx for Tamiflu - only start within next 24-48 hours if develop fevers and feel body aches not improving - Start Tamiflu (anti-flu medicine) take one capsule 75mg  twice a day for 5 days - Wash hands and cover cough very well to avoid spread of infection - For symptom control:      - Take Ibuprofen / Advil 400-600mg  every 6-8 hours as needed for fever / muscle aches, and may also take Tylenol 500-1000mg  per dose every 6-8 hours or 3 times a day, can alternate dosing      - Start Tessalon perls one every 8 hours or 3 times a day as needed for cough      - Start Atrovent nasal spray decongestant 2 sprays in each nostril up to 4 times daily for 7 days      - Start OTC Mucinex-DM for cough and congestion for up to 7 days - Improve hydration with plenty of clear fluids  If significant worsening with poor fluid intake, worsening fever, difficulty breathing due to coughing, worsening body aches, weakness, or other more concerning symptoms difficulty breathing you can seek treatment at Emergency Department. Also if improved flu symptoms and then worsening days to week later with concerns for bronchitis, productive cough fever chills again we may need to check for possible pneumonia that can occur after the flu  Please schedule a follow-up appointment with Dr. Althea CharonKaramalegos in 1-2 weeks as needed if worsening from Flu / Bronchitis  If you have any other questions or concerns, please feel free to call the office or send a message through MyChart. You may also schedule an earlier appointment if necessary.  Additionally, you may be receiving a survey about your experience at our office within a few days to 1 week by e-mail or mail. We value your feedback.  Saralyn PilarAlexander Guneet Delpino, DO [facility]

## 2017-11-23 NOTE — Progress Notes (Signed)
Subjective:    Patient ID: Carol Collins, female    DOB: 02-Dec-1975, 42 y.o.   MRN: [id number]  Carol Collins is a 42 y.o. female presenting on 11/23/2017 for Cough (bodyache, HA, ear pain, sore throat but not chills or fever onset week)  Patient presents for a same day appointment.  HPI   FLU-LIKE ILLNESS / ACUTE VIRAL SYNDROME Reports URI symptoms for 1 week with sinus frontal pressure, did not improve after conservative treatment tried some OTC. Usually mucinex helps. 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 No close sick contacts. - Now worsening onset this AM, worsening cough and chest wall "pulling" due to cough, feels like "truck ran over" her, unsure if chest congestion. - Admits house is dry due to heat, some mild nosebleed with blowing nose and congestion - Admits generalized body aches feels some chills or feeling cold - Denies measured fever, sweat, nausea vomiting, headache, ear pain, dyspnea  Additional update Regarding chronic pain - last visit 06/2017 we increased Gabapentin she had too much sedation, concern with memory issues, and she stopped and switched to more marijuana daily. She was using this intermittently before, now doing much better with regards to both pain and memory.  Health Maintenance: UTD Flu Shot 08/29/17  Depression screen [facility] 2/9 11/21/2016 11/09/2015 10/08/2015  Decreased Interest 0 0 0  Down, Depressed, Hopeless 0 0 0  PHQ - 2 Score 0 0 0    Social History   Tobacco Use  . Smoking status: Current Some Day Smoker    Packs/day: 0.25    Years: 17.00    Pack years: 4.25    Types: Cigarettes  . Smokeless tobacco: Current User  Substance Use Topics  . Alcohol use: No  . Drug use: Yes    Types: Marijuana    Review of Systems Per HPI unless specifically indicated above     Objective:    BP 129/76   Pulse 82   Temp 98.5 F (36.9 C) (Oral)   Resp 16   Ht 5\' 1"  (1.549 m)   Wt 213 lb (96.6 kg)   LMP  11/20/2013 Comment: hysterectomy  SpO2 100%   BMI 40.25 kg/m   Wt Readings from Last 3 Encounters:  11/23/17 213 lb (96.6 kg)  07/03/17 200 lb 12.8 oz (91.1 kg)  06/12/17 202 lb 12.8 oz (92 kg)    Physical Exam  Constitutional: She is oriented to person, place, and time. She appears well-developed and well-nourished. No distress.  Tired and ill appearing, uncomfortable with respiratory and cough, cooperative, obese  HENT:  Head: Normocephalic and atraumatic.  Mouth/Throat: Oropharynx is clear and moist.  Frontal sinuses mild tender. Nares patent with some deeper turbinate edema without congestion or purulence. Bilateral TMs clear with teal tymp tubes in place, R clear w/o cerumen, L with some cerumen may be obstructing. without erythema. Oropharynx mild generalized erythema non specific with posterior pharyngeal drainage without  exudates, edema or asymmetry.  Eyes: Conjunctivae are normal. Right eye exhibits no discharge. Left eye exhibits no discharge.  Neck: Normal range of motion. Neck supple.  Cardiovascular: Normal rate, regular rhythm, normal heart sounds and intact distal pulses.  No murmur heard. Pulmonary/Chest: Effort normal and breath sounds normal. No respiratory distress. She has no wheezes. She has no rales.  Good air movement. Occasional coughing. No focal crackles or abnormality  Musculoskeletal: Normal range of motion. She exhibits no edema.  Lymphadenopathy:    She has no  cervical adenopathy.  Neurological: She is alert and oriented to person, place, and time.  Skin: Skin is warm and dry. No rash noted. She is not diaphoretic. No erythema.  Psychiatric: Her behavior is normal.  Nursing note and vitals reviewed.     Results for orders placed or performed in visit on 11/23/17  POCT Influenza A/B  Result Value Ref Range   Influenza A, POC Negative Negative   Influenza B, POC Negative Negative  POCT rapid strep A  Result Value Ref Range   Rapid Strep A Screen  Negative Negative      Assessment & Plan:   Problem List Items Addressed This Visit    None    Visit Diagnoses    Acute viral syndrome    -  Primary   Relevant Medications   ipratropium (ATROVENT) 0.06 % nasal spray   benzonatate (TESSALON) 100 MG capsule   Generalized body aches       Relevant Orders   POCT Influenza A/B (Completed)   Sore throat       Relevant Orders   POCT rapid strep A (Completed)   Cough          Clinically diagnosed acute viral syndrome vs influenza despite negative rapid flu test today, concern for flu still due to significant known exposure to sick contacts - Duration x 24 hours with acute worsening but URI in 1 week. Tolerating PO and well hydrated - No other focal findings of infection today - S/p influenza vaccine this season  Plan: 1. Supportive care rx  - Start Atrovent nasal spray decongestant 2 sprays in each nostril up to 4 times daily for 7 days - Start Tessalon Perls take 1 capsule up to 3 times a day as needed for cough - Also precautionary rx given if worse or not improve flu symptoms in 24-48 hours - Start Tamiflu 75mg  capsules BID x 5 days, printed rx 2. Supportive care as advised with NSAID / Tylenol PRN fever/myalgias, improve hydration, may take OTC Cold/Flu meds 3. Note for work through weekend  Return criteria given if significant worsening, consider post-influenza complications, otherwise follow-up if needed - consider CXR within 1 week if not improved  Meds ordered this encounter  Medications  . ipratropium (ATROVENT) 0.06 % nasal spray    Sig: Place 2 sprays into both nostrils 4 (four) times daily. For up to 5-7 days then stop.    Dispense:  15 mL    Refill:  0  . benzonatate (TESSALON) 100 MG capsule    Sig: Take 1 capsule (100 mg total) by mouth 3 (three) times daily as needed for cough.    Dispense:  30 capsule    Refill:  0    Follow up plan: Return in about 1 week (around 11/30/2017), or if symptoms worsen or fail to  improve, for viral syndrome.  Saralyn PilarAlexander Lonzo Saulter, DO [facility] 11/23/2017, 10:20 AM

## 2017-12-10 ENCOUNTER — Other Ambulatory Visit: Payer: Medicaid Other

## 2017-12-17 ENCOUNTER — Ambulatory Visit: Payer: Medicaid Other | Admitting: Family Medicine

## 2017-12-19 ENCOUNTER — Telehealth: Payer: Self-pay | Admitting: Family Medicine

## 2017-12-19 DIAGNOSIS — B373 Candidiasis of vulva and vagina: Secondary | ICD-10-CM

## 2017-12-19 DIAGNOSIS — B3731 Acute candidiasis of vulva and vagina: Secondary | ICD-10-CM

## 2017-12-19 MED ORDER — FLUCONAZOLE 150 MG PO TABS: ORAL_TABLET | ORAL | 0 refills | Status: DC

## 2017-12-19 NOTE — Telephone Encounter (Signed)
Last seen early January 2019 for URI illness, treated with Tamiflu.  She now calls with concern of yeast infection with discharge itching similar to previous. Requesting rx medicine for this or if needed to be seen in office.  I advised that since she was just seen for sick visit, it may be related to treatment. We can phone in Diflucan anti-yeast medicine and if her symptoms do not resolve or any changes she should then follow-up in office.  Saralyn PilarAlexander Karamalegos, DO [facility] 12/19/2017, 1:05 PM

## 2017-12-25 ENCOUNTER — Ambulatory Visit: Payer: Medicaid Other | Admitting: Family Medicine

## 2018-01-07 ENCOUNTER — Encounter: Payer: Self-pay | Admitting: Family Medicine

## 2018-01-15 LAB — HM DIABETES EYE EXAM

## 2018-01-22 ENCOUNTER — Encounter: Payer: Self-pay | Admitting: Family Medicine

## 2018-01-28 ENCOUNTER — Ambulatory Visit (INDEPENDENT_AMBULATORY_CARE_PROVIDER_SITE_OTHER): Payer: Medicaid Other | Admitting: Family Medicine

## 2018-01-28 ENCOUNTER — Encounter: Payer: Self-pay | Admitting: Family Medicine

## 2018-01-28 VITALS — BP 122/68 | HR 80 | Temp 98.4°F | Resp 16 | Ht 61.0 in | Wt 203.0 lb

## 2018-01-28 DIAGNOSIS — R110 Nausea: Secondary | ICD-10-CM

## 2018-01-28 DIAGNOSIS — R630 Anorexia: Secondary | ICD-10-CM

## 2018-01-28 DIAGNOSIS — K130 Diseases of lips: Secondary | ICD-10-CM

## 2018-01-28 MED ORDER — DOXYCYCLINE HYCLATE 100 MG PO TABS: 100.0000 mg | ORAL_TABLET | Freq: Two times a day (BID) | ORAL | 0 refills | Status: DC

## 2018-01-28 NOTE — Patient Instructions (Addendum)
Thank you for coming to the office today.  1.  You have an abscess, which is a localized bacterial infection in deeper layers of skin.  Start taking antibiotic as prescribed, Doxycycline 100mg  twice daily for 7 days, make sure to take with full glass of water and stay upright or seated for 30 min (do not lay down after taking or can cause burning irritation of throat).  Recommend warm compresses/wash cloth several times a day to help heal and may actually drain some pus on its own, which can be normal.  You may need to return soon for re-evaluation if worsening such as spreading redness or streaking redness, significantly larger size, increased pain, fevers/chills, nausea vomiting and cannot take antibiotic. If significantly worse symptoms or most of these symptoms, would recommend going straight to Hospital Emergency Dept as you may require IV antibiotics instead.   For nausea vomiting episodes  Try to STOP or reduce Metformin further to see if helps  Next step in future would be to HOLD Bydureon maybe temporarily to see if symptoms resolve  Try to keep up with improving appetite  If worsening or new symptoms then need to re discuss consider imaging or GI referral  Please schedule a Follow-up Appointment to: Return in about 1 week (around 02/04/2018) for DM A1c, f/u lip, nausea.  If you have any other questions or concerns, please feel free to call the office or send a message through MyChart. You may also schedule an earlier appointment if necessary.  Additionally, you may be receiving a survey about your experience at our office within a few days to 1 week by e-mail or mail. We value your feedback.  Saralyn PilarAlexander Kodie Pick, DO [facility]

## 2018-01-28 NOTE — Progress Notes (Signed)
Subjective:    Patient ID: Carol Collins, female    DOB: 1976-01-23, 42 y.o.   MRN: [id number]  Carol Collins is a 42 y.o. 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, now new concern, over past >6 months had a "small hard knot" on lip, but did not think much of it until past week it "opened" and drained small amount of thicker material possibly pus-like or sebaceous material Requesting antibiotics for treatment now and asking about possible drainage or aspiration, she has existing [facility] but not seen them for this issue Admits pain due to lip swelling Denies fevers chills sweats, spreading redness  Nausea / Vomiting / Weight Loss Reports recent issue over past few months has had some nausea and some vomiting episodes, she has history of DM and is on Metformin and Bydureon, her last A1c was 5.0 and very well controlled, she has been on lower metformin since. No known causes in particular of GI upset symptoms, usually GLP1 will cause her to be mild nausea without vomiting for first few min to hour after shot then no problem, has been on it for few years. Trigger is worse with strong taste or smell, she seems more sensitive to this - Now it is happening more frequent 1-2x week instead of 1-2x month. Usually onset is in AM before eating or drinking - Sugar has been controlled 80 to 110 - Recent significant life stressors going on with her family among other factors, thinks anxiety and stress could be related - Admits some reduced appetite associated with this, recently worse over past 1 week lost about 7-9 lbs, overall weight back down to similar weight as previous, but she is worried about appetite - No associated GERD symptoms or heartburn or abdominal pain or other GI red flags - She is s/p hysterectomy  and not candidate for pregnancy as cause of symptoms  Depression screen [facility] 2/9 11/21/2016 11/09/2015 10/08/2015  Decreased Interest 0 0 0  Down, Depressed, Hopeless 0 0 0  PHQ - 2 Score 0 0 0    Social History   Tobacco Use  . Smoking status: Current Some Day Smoker    Packs/day: 0.25    Years: 17.00    Pack years: 4.25    Types: Cigarettes  . Smokeless tobacco: Current User  Substance Use Topics  . Alcohol use: No  . Drug use: Yes    Types: Marijuana    Comment: Regular use marijuana now for pain    Review of Systems Per HPI unless specifically indicated above     Objective:    BP 122/68   Pulse 80   Temp 98.4 F (36.9 C) (Oral)   Resp 16   Ht 5\' 1"  (1.549 m)   Wt 203 lb (92.1 kg)   LMP 11/20/2013 Comment: hysterectomy  SpO2 100%   BMI 38.36 kg/m   Wt Readings from Last 3 Encounters:  01/28/18 203 lb (92.1 kg)  11/23/17 213 lb (96.6 kg)  07/03/17 200 lb 12.8 oz (91.1 kg)    Physical Exam  Constitutional: She is oriented to person, place, and time. She appears well-developed and well-nourished. No distress.  Well-appearing, comfortable, cooperative  HENT:  Head: Normocephalic and atraumatic.  Mouth/Throat: Oropharynx is clear and moist.  Eyes: Conjunctivae are normal.  Right eye exhibits no discharge. Left eye exhibits no discharge.  Cardiovascular: Normal rate.  Pulmonary/Chest: Effort normal.  Musculoskeletal: She exhibits no edema.  Neurological: She is alert and oriented to person, place, and time.  Skin: Skin is warm and dry. No rash noted. She is not diaphoretic. No erythema.  Localized 1 cm or less lesion of Left lower lip near corner of lip, appears slightly localized nodular swelling mild tender, firm tissue induration, without extending erythema, healing ulceration without opening or sore or drainage, no extending erythema  Psychiatric: She has a normal mood and affect. Her behavior is normal.  Well groomed, good eye contact, normal speech and  thoughts  Nursing note and vitals reviewed.    Results for orders placed or performed in visit on 01/22/18  HM DIABETES EYE EXAM  Result Value Ref Range   HM Diabetic Eye Exam No Retinopathy No Retinopathy      Assessment & Plan:   Problem List Items Addressed This Visit    None    Visit Diagnoses    Abscess of lip    -  Primary  Consistent with new acute Left lower lip abscess without extension or cellulitis, uncertain etiology but has had localized sebaceous cyst in this area for while it seems, possibly not infectious but swelling causing pain. 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 Not offer I&D due to location of lip, and feels more indurated without obvious fluctuance - Trial on Doxycycline antibiotic BID x 10 days for empiric coverage 2. Warm compresses per handout - Advised will likely need ENT - if not improved she may call existing ENT and we can send referral if needed 3. Return precautions given if worsening    Relevant Medications   doxycycline (VIBRA-TABS) 100 MG tablet   Nausea       Decrease in appetite     Uncertain etiology, suspect one factor may be very well controlled Diabetes now A1c 5.0, maybe on too strong of medicine, may need reduce metformin and/or GLP1. Additionally other major factor most likely life stressors affecting nausea and possibly triggering vomiting but seems certain food/smell triggers, uncertain why more sensitive now - Other GI etiology seems less likely without abdominal pain, GERD symptoms, bowel habits are more regular, not related to eating or biliary colick, no dark stools or blood  Plan Reassurance, monitor symptoms and triggers Follow-up within 1 week for planned check A1c and re-view DM meds - advised for now may stop Metformin see if symptoms improve, next will hold Bydureon for trial May consider Zofran in future but seems acute onset less helpful due to timing Considered DM  gastroparesis but seems unrelated to eating Follow-up in future may need refer GI      Meds ordered this encounter  Medications  . doxycycline (VIBRA-TABS) 100 MG tablet    Sig: Take 1 tablet (100 mg total) by mouth 2 (two) times daily. For 10 days. Take with full glass of water, stay upright 30 min after taking.    Dispense:  20 tablet    Refill:  0     Follow up plan: Return in about 1 week (around 02/04/2018) for DM A1c, f/u lip, nausea.  Saralyn PilarAlexander Eddy Termine, DO [facility] 01/28/2018, 10:39 PM

## 2018-02-04 ENCOUNTER — Telehealth: Payer: Self-pay | Admitting: Family Medicine

## 2018-02-04 DIAGNOSIS — K130 Diseases of lips: Secondary | ICD-10-CM

## 2018-02-04 NOTE — Telephone Encounter (Signed)
Pt needs a referral for lip.  The swelling came back [phone]

## 2018-02-04 NOTE — Telephone Encounter (Signed)
Last seen 01/28/18 for same problem, treated with Doxycycline and warm compresses.  Now not improved.  Referral to return to established ENT for recurrent lip abscess, already treated with antibiotics, prior history of abscess recurrent in other areas of body, now with lip / face involvement.  Can you check with [facility] / Dr Andee PolesVaught to see if this patient complaint is appropriate? I am not sure if they will treat a lip abscess, if they are okay with this for an already established patient, then this is my preference, otherwise, please ask if they have a preference of other surgical speciality that the patient see for a different referral before scheduling apt.  Thanks  Saralyn PilarAlexander Karamalegos, DO [facility] 02/04/2018, 5:16 PM

## 2018-02-05 NOTE — Telephone Encounter (Signed)
Paperwork were faxed to [facility] they will schedule appointment and call patient.

## 2018-02-28 ENCOUNTER — Other Ambulatory Visit: Payer: Self-pay | Admitting: Family Medicine

## 2018-02-28 ENCOUNTER — Encounter: Payer: Self-pay | Admitting: Family Medicine

## 2018-02-28 ENCOUNTER — Ambulatory Visit (INDEPENDENT_AMBULATORY_CARE_PROVIDER_SITE_OTHER): Payer: Medicaid Other | Admitting: Family Medicine

## 2018-02-28 VITALS — BP 120/65 | HR 73 | Temp 98.1°F | Resp 16 | Ht 61.0 in | Wt 207.0 lb

## 2018-02-28 DIAGNOSIS — I10 Essential (primary) hypertension: Secondary | ICD-10-CM

## 2018-02-28 DIAGNOSIS — R112 Nausea with vomiting, unspecified: Secondary | ICD-10-CM

## 2018-02-28 DIAGNOSIS — E785 Hyperlipidemia, unspecified: Secondary | ICD-10-CM

## 2018-02-28 DIAGNOSIS — E1169 Type 2 diabetes mellitus with other specified complication: Secondary | ICD-10-CM

## 2018-02-28 DIAGNOSIS — E1142 Type 2 diabetes mellitus with diabetic polyneuropathy: Secondary | ICD-10-CM

## 2018-02-28 DIAGNOSIS — D509 Iron deficiency anemia, unspecified: Secondary | ICD-10-CM

## 2018-02-28 DIAGNOSIS — Z0000 Encounter for general adult medical examination without abnormal findings: Secondary | ICD-10-CM

## 2018-02-28 LAB — POCT GLYCOSYLATED HEMOGLOBIN (HGB A1C): Hemoglobin A1C: 5.9 — AB (ref ?–5.7)

## 2018-02-28 MED ORDER — ORLISTAT 60 MG PO CAPS: 60.0000 mg | ORAL_CAPSULE | Freq: Three times a day (TID) | ORAL | Status: DC

## 2018-02-28 NOTE — Patient Instructions (Addendum)
Thank you for coming to the office today.  A1c 5.9 - great job, goal is < 7  CONTINUE Bydureon  REMAIN OFF Metformin completely - do not need anymore  Consider Pneumovax-23 before age 42 for a early pneumonia booster  DUE for FASTING BLOOD WORK (no food or drink after midnight before the lab appointment, only water or coffee without cream/sugar on the morning of)  SCHEDULE "Lab Only" visit in the morning at the clinic for lab draw in 6 MONTHS   - Make sure Lab Only appointment is at about 1 week before your next appointment, so that results will be available  For Lab Results, once available within 2-3 days of blood draw, you can can log in to MyChart online to view your results and a brief explanation. Also, we can discuss results at next follow-up visit.   Please schedule a Follow-up Appointment to: Return in about 6 months (around 08/30/2018) for Annual Physical.  If you have any other questions or concerns, please feel free to call the office or send a message through MyChart. You may also schedule an earlier appointment if necessary.  Additionally, you may be receiving a survey about your experience at our office within a few days to 1 week by e-mail or mail. We value your feedback.  Saralyn PilarAlexander Torrance Frech, DO [facility]

## 2018-02-28 NOTE — Assessment & Plan Note (Signed)
Considered Morbid Obesity due to BMI >39 with comorbid condition DM, HLD  Improved weight loss on GLP1 Bydureon Bcise and improved diet / exercise Down from >235 lbs to under 210 lbs, has been relatively stable and unable to lose wt below 200 lbs consistently Encourage continue improved DM diet, with A1c well controlled, reduce portions and monitor caloric intake Goal to keep improving regular exercise Continue GLP Discussed other med options - defer trial of Contrave for now due to cost. History of use of phentermine in past did not tolerate, and I do not prescribe routinely. Recommend trial of OTC Alli (Orilstat) 60mg  TID wc (fat containing only), to help GI non absorption calorie loss

## 2018-02-28 NOTE — Progress Notes (Signed)
Subjective:    Patient ID: Carol Collins, female    DOB: [birth year], 42 y.o.   MRN: [id number]  Carol Collins is a 42 y.o. female presenting on 02/28/2018 for Diabetes   HPI   CHRONIC DM, Type 2 / DM Neuropathy: Reports no concerns today. Recent low A1c CBGs: Checks few times weekly Meds: Exenatide ER (Bydureon) 2mg  weekly - OFF Metformin 1000mg  BID Reports good compliance. Tolerating well Currently on ACEi Lifestyle: See below - UTD on last DM Eye Exam [facility], 01/15/18, no DM retinopathy) - Admits episodic numbness tingling in feet w/ temperature change - she stopped Gabapentin before for neuropathy due to memory changes and sedation, she does admit to occasional marijuana use. Previously saw Podiatry Denies hypoglycemia  Obesity BMI >39 - Had some good weight loss for a while on current medicines with GLP Bydureon. Trend down from >235 lbs months ago. - Asking about other wt loss med Lifestyle: - Weight stable around 203 to 207 lb, lowest was 197 lb, she tracks weight - Exercise - going to gym 3-4 days a week, brings spouse Irving Burton, and they go about 1 am - Diet - Still improving DM diet, reducing sugars / carbs starches  FOLLOW-UP on Nausea / Vomiting - Last visit with me 01/28/18, for discussed same problem, treated with recommend trial off Metformin and possibly Bydureon to see if triggering nausea, or if due to diet or other GI cause, see prior notes for background information. - Interval update with dramatically resolved nausea and vomiting after DC'd Metformin. - Today patient reports doing well, without nausea See above - Denies abdominal pain, nausea vomiting, diarrhea constipation, unintentional weight loss, sweats fever  UPDATE - Lip abscess - was referred to [facility] ENT on 3/18 - she had apt but on day of apt it had to be re-scheduled due to surgeon was called to emergency at hospital.  Health Maintenance:  Offered Pneumonia vaccine, Pneumovax-23  for diabetic patient < age 30. She will consider this in future. Declines today.  Depression screen [facility] 2/9 02/28/2018 11/21/2016 11/09/2015  Decreased Interest 0 0 0  Down, Depressed, Hopeless 0 0 0  PHQ - 2 Score 0 0 0    Social History   Tobacco Use  . Smoking status: Current Some Day Smoker    Packs/day: 0.25    Years: 17.00    Pack years: 4.25    Types: Cigarettes  . Smokeless tobacco: Current User  Substance Use Topics  . Alcohol use: No  . Drug use: Yes    Types: Marijuana    Comment: Regular use marijuana now for pain    Review of Systems Per HPI unless specifically indicated above     Objective:    BP 120/65   Pulse 73   Temp 98.1 F (36.7 C) (Oral)   Resp 16   Ht 5\' 1"  (1.549 m)   Wt 207 lb (93.9 kg)   LMP 11/20/2013 Comment: hysterectomy  SpO2 100%   BMI 39.11 kg/m   Wt Readings from Last 3 Encounters:  02/28/18 207 lb (93.9 kg)  01/28/18 203 lb (92.1 kg)  11/23/17 213 lb (96.6 kg)    Physical Exam  Constitutional: She is oriented to person, place, and time. She appears well-developed and well-nourished. No distress.  Well-appearing, comfortable, cooperative, obese  HENT:  Head: Normocephalic and atraumatic.  Mouth/Throat: Oropharynx is clear and moist.  Left lower lip without swelling or erythema or ulceration. Now has very small palpable  nodule inside area of prior abscess.  Eyes: Conjunctivae are normal. Right eye exhibits no discharge. Left eye exhibits no discharge.  Neck: Normal range of motion. Neck supple.  Cardiovascular: Normal rate, regular rhythm, normal heart sounds and intact distal pulses.  No murmur heard. Pulmonary/Chest: Effort normal and breath sounds normal. No respiratory distress. She has no wheezes. She has no rales.  Musculoskeletal: Normal range of motion. She exhibits no edema.  Lymphadenopathy:    She has no cervical adenopathy.  Neurological: She is alert and oriented to person, place, and time.  Skin: Skin is warm and  dry. No rash noted. She is not diaphoretic. No erythema.  Psychiatric: She has a normal mood and affect. Her behavior is normal.  Well groomed, good eye contact, normal speech and thoughts  Nursing note and vitals reviewed.    Diabetic Foot Exam - Simple   Simple Foot Form Diabetic Foot exam was performed with the following findings:  Yes 02/28/2018  8:45 AM  Visual Inspection No deformities, no ulcerations, no other skin breakdown bilaterally:  Yes Sensation Testing See comments:  Yes Pulse Check Posterior Tibialis and Dorsalis pulse intact bilaterally:  Yes Comments Mostly intact monofilament testing, but has several areas scattered with reduced monofilament L great toe and R forefoot. Bilateral mid and hind feet seem intact to monofilament sensation. Some areas of increased sensitivity.     Results for orders placed or performed in visit on 02/28/18  POCT HgB A1C  Result Value Ref Range   Hemoglobin A1C 5.9 (A) 5.7      Assessment & Plan:   Problem List Items Addressed This Visit    DM type 2 with diabetic peripheral neuropathy (HCC) - Primary    Stable well controlled DM with A1c 5.9, remains under 6 for while now Now on monotherapy GLP1 Bydureon BCise. Self discontinued Metformin 1000mg  BID due to nausea vomiting  Improved lifestyle diet/exercise - Complication with DM neuropathy (off gabapentin, side effect, ineffective)  Plan: - Remain off Metformin - Continue Bydureon 2mg  weekly - Check CBGs - Has declined ASA / Statin before - Continue ACEi - UTD DM Eye - Foot exam done today - Offered Pnuemovax-23 - defer for now Follow-up 6 months annual + A1c labs      Relevant Medications   orlistat (ALLI) 60 MG capsule   Other Relevant Orders   POCT HgB A1C (Completed)   Morbid obesity (HCC)    Considered Morbid Obesity due to BMI >39 with comorbid condition DM, HLD  Improved weight loss on GLP1 Bydureon Bcise and improved diet / exercise Down from >235 lbs to under  210 lbs, has been relatively stable and unable to lose wt below 200 lbs consistently Encourage continue improved DM diet, with A1c well controlled, reduce portions and monitor caloric intake Goal to keep improving regular exercise Continue GLP Discussed other med options - defer trial of Contrave for now due to cost. History of use of phentermine in past did not tolerate, and I do not prescribe routinely. Recommend trial of OTC Alli (Orilstat) 60mg  TID wc (fat containing only), to help GI non absorption calorie loss      Relevant Medications   orlistat (ALLI) 60 MG capsule    Other Visit Diagnoses    Nausea and vomiting, intractability of vomiting not specified, unspecified vomiting type       Resolved, seems secondary to metformin, with low a1c perhaps. Remain off metformin      Meds ordered this encounter  Medications  . orlistat (ALLI) 60 MG capsule    Sig: Take 1 capsule (60 mg total) by mouth 3 (three) times daily with meals. (fat containing meals only)     Follow up plan: Return in about 6 months (around 08/30/2018) for Annual Physical.  Future labs ordered for 08/27/18  Saralyn PilarAlexander Karamalegos, DO [facility] 02/28/2018, 9:17 AM

## 2018-02-28 NOTE — Assessment & Plan Note (Addendum)
Stable well controlled DM with A1c 5.9, remains under 6 for while now Now on monotherapy GLP1 Bydureon BCise. Self discontinued Metformin 1000mg  BID due to nausea vomiting  Improved lifestyle diet/exercise - Complication with DM neuropathy (off gabapentin, side effect, ineffective)  Plan: - Remain off Metformin - Continue Bydureon 2mg  weekly - Check CBGs - Has declined ASA / Statin before - Continue ACEi - UTD DM Eye - Foot exam done today - Offered Pnuemovax-23 - defer for now Follow-up 6 months annual + A1c labs

## 2018-03-31 ENCOUNTER — Other Ambulatory Visit: Payer: Self-pay | Admitting: Family Medicine

## 2018-03-31 DIAGNOSIS — R600 Localized edema: Secondary | ICD-10-CM

## 2018-04-30 ENCOUNTER — Encounter: Payer: Self-pay | Admitting: Family Medicine

## 2018-04-30 NOTE — Telephone Encounter (Signed)
Already answered this question on chart of patient, Tammi KlippelLilith Hillock, already sent correct rx, talked with pharmacy and patient/mother.  Saralyn PilarAlexander Maisyn Nouri, DO [facility] 04/30/2018, 5:11 PM

## 2018-05-20 ENCOUNTER — Other Ambulatory Visit: Payer: Self-pay | Admitting: Family Medicine

## 2018-05-20 DIAGNOSIS — B373 Candidiasis of vulva and vagina: Secondary | ICD-10-CM

## 2018-05-20 DIAGNOSIS — B3731 Acute candidiasis of vulva and vagina: Secondary | ICD-10-CM

## 2018-05-28 ENCOUNTER — Encounter: Payer: Self-pay | Admitting: Family Medicine

## 2018-05-28 DIAGNOSIS — B373 Candidiasis of vulva and vagina: Secondary | ICD-10-CM

## 2018-05-28 DIAGNOSIS — B3731 Acute candidiasis of vulva and vagina: Secondary | ICD-10-CM

## 2018-05-28 MED ORDER — FLUCONAZOLE 150 MG PO TABS: 150.0000 mg | ORAL_TABLET | ORAL | 0 refills | Status: DC

## 2018-06-04 ENCOUNTER — Other Ambulatory Visit: Payer: Self-pay | Admitting: Family Medicine

## 2018-06-04 DIAGNOSIS — E1142 Type 2 diabetes mellitus with diabetic polyneuropathy: Secondary | ICD-10-CM

## 2018-06-05 ENCOUNTER — Encounter: Payer: Self-pay | Admitting: Family Medicine

## 2018-06-05 NOTE — Telephone Encounter (Signed)
Mychart message

## 2018-06-06 ENCOUNTER — Encounter: Payer: Self-pay | Admitting: Family Medicine

## 2018-06-06 ENCOUNTER — Ambulatory Visit (INDEPENDENT_AMBULATORY_CARE_PROVIDER_SITE_OTHER): Payer: Medicaid Other | Admitting: Family Medicine

## 2018-06-06 ENCOUNTER — Other Ambulatory Visit: Payer: Self-pay

## 2018-06-06 VITALS — BP 130/74 | HR 78 | Temp 98.2°F | Ht 61.0 in | Wt 188.2 lb

## 2018-06-06 DIAGNOSIS — G43A1 Cyclical vomiting, intractable: Secondary | ICD-10-CM

## 2018-06-06 DIAGNOSIS — R1115 Cyclical vomiting syndrome unrelated to migraine: Secondary | ICD-10-CM

## 2018-06-06 IMAGING — MR MR HEAD WO/W CM
9 of 11 series · 36 of 48 positions shown · IV contrast (multihance)
Comparison: None.

CLINICAL DATA: Right-sided hearing loss and tinnitus for many
years. Dizziness. Symptoms worsening over the past few months.

EXAM:
MRI HEAD WITHOUT AND WITH CONTRAST
TECHNIQUE: Multiplanar, multiecho pulse sequences of the brain and surrounding
structures were obtained without and with intravenous contrast.
CONTRAST:  20mL MULTIHANCE GADOBENATE DIMEGLUMINE 529 MG/ML IV SOLN

[Series 2: T1 · sagittal · 5.0mm · 0.45mm/px · 3 of 25 slices shown (1 of 4)]
[im 1/25]
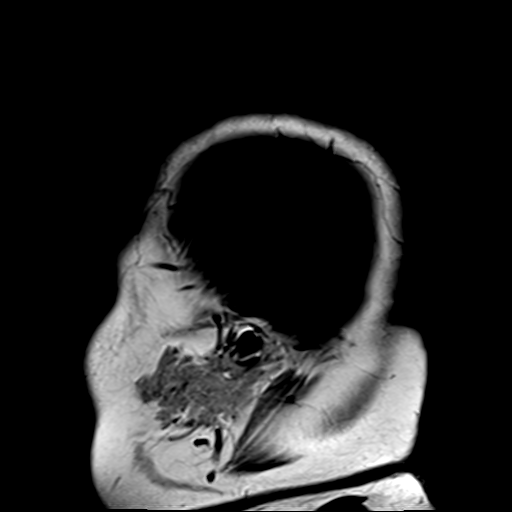
[im 13/25]
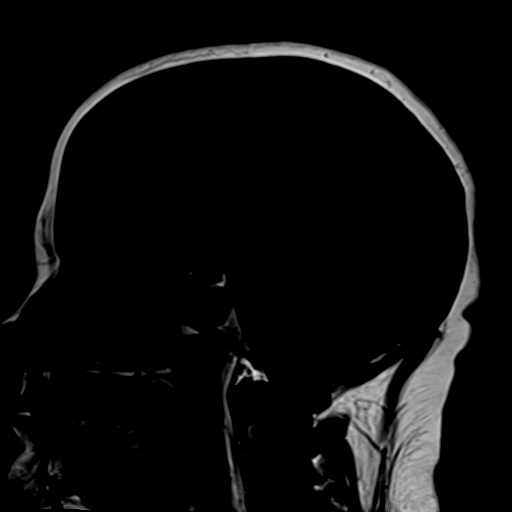
[im 25/25]
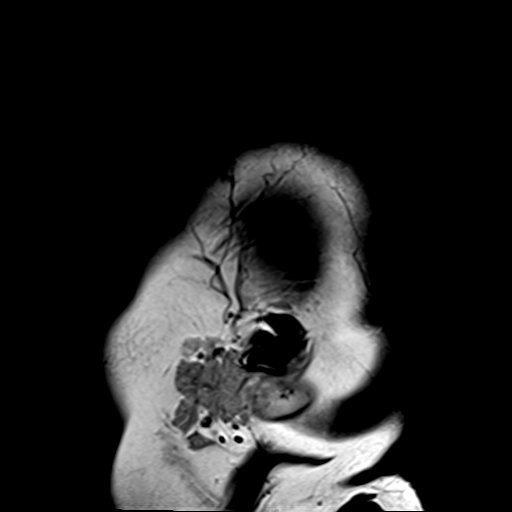

[Series 5: T2 · axial · 5.0mm · 0.60mm/px · z∈[-32,+124]mm · 4 of 25 slices shown (1 of 2)]
[im 1/25]
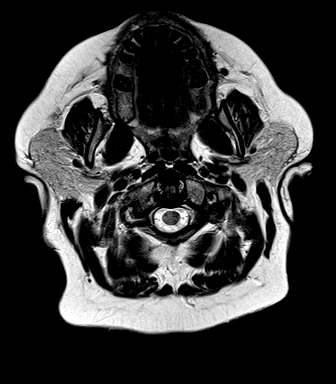
[im 9/25]
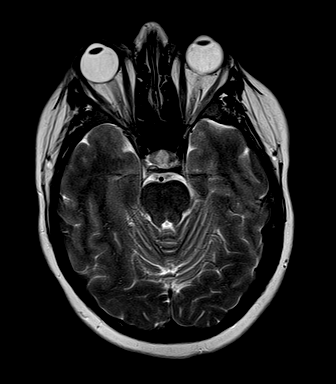
[im 17/25]
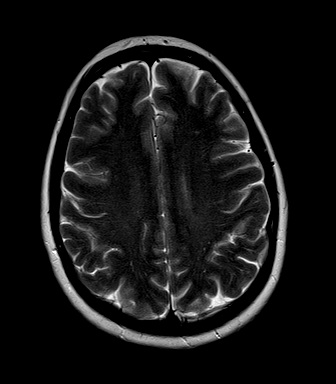
[im 25/25]
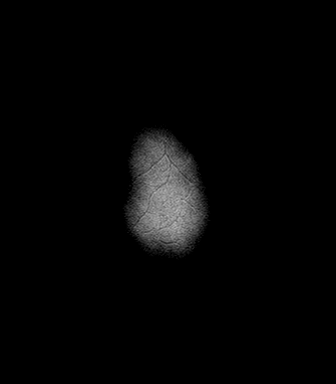

[Series 6: FLAIR · axial · 5.0mm · 0.45mm/px · z∈[-32,+124]mm · 4 of 25 slices shown]
[im 1/25]
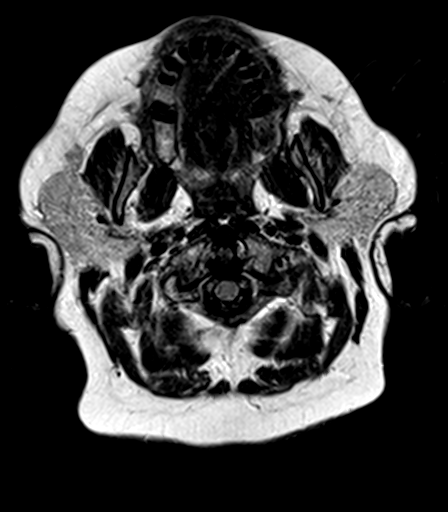
[im 9/25]
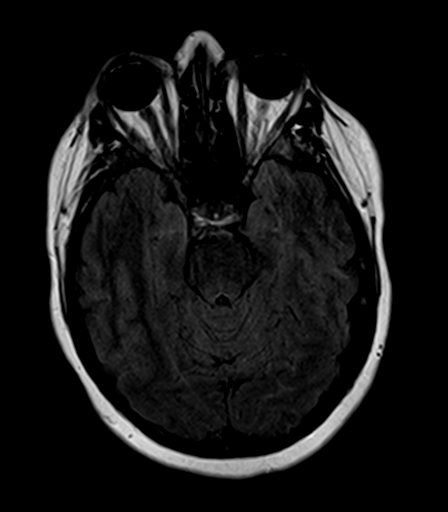
[im 17/25]
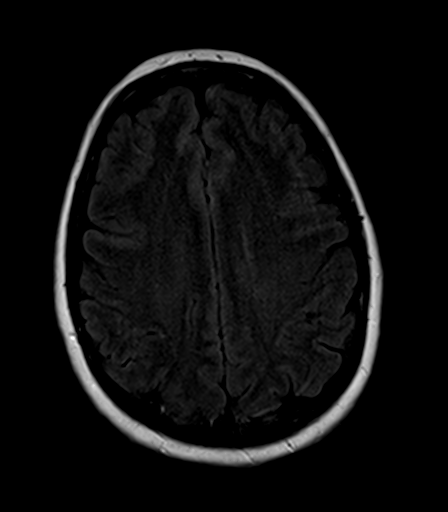
[im 25/25]
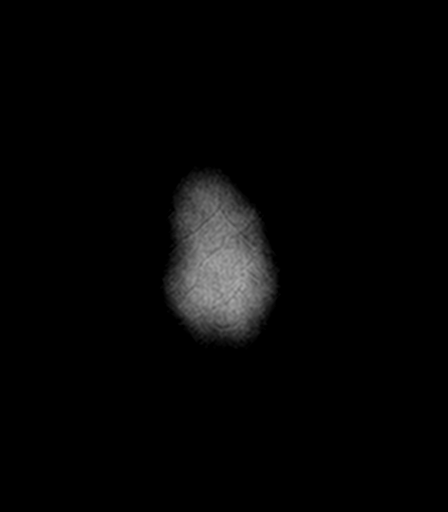

[Series 7: T1 · coronal · 3.0mm · 0.78mm/px · 2 of 11 slices shown (2 of 4)]
[im 1/11]
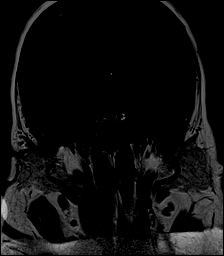
[im 11/11]
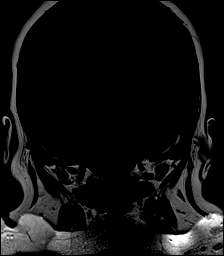

[Series 8: T2 · axial · 1.0mm · 0.35mm/px · z∈[-14,+25]mm · 6 of 40 slices shown (2 of 2)]
[im 1/40]
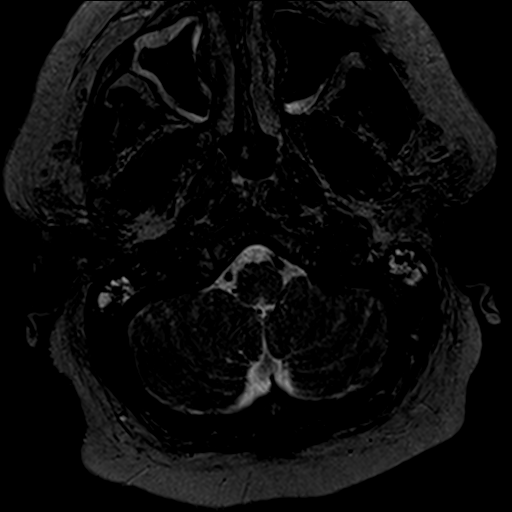
[im 8/40]
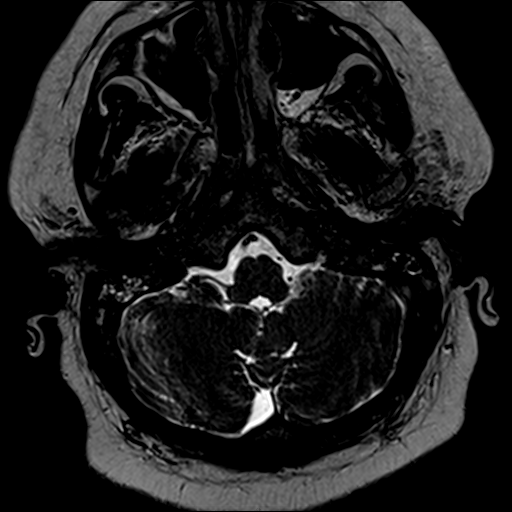
[im 16/40]
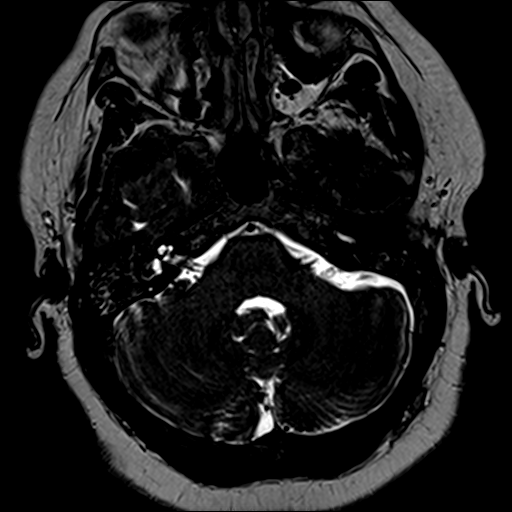
[im 24/40]
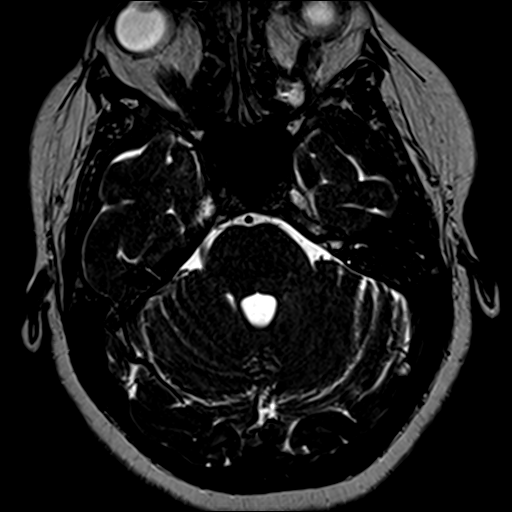
[im 32/40]
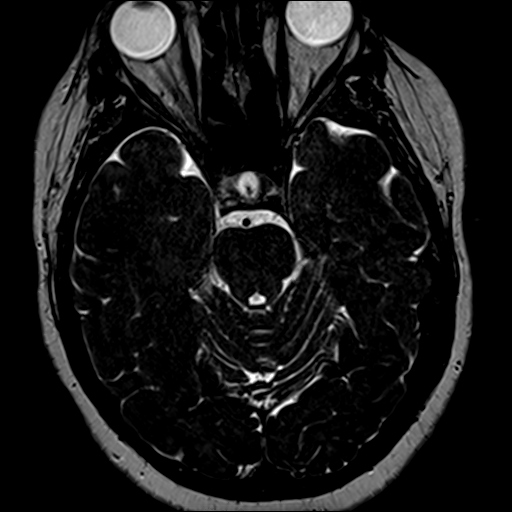
[im 40/40]
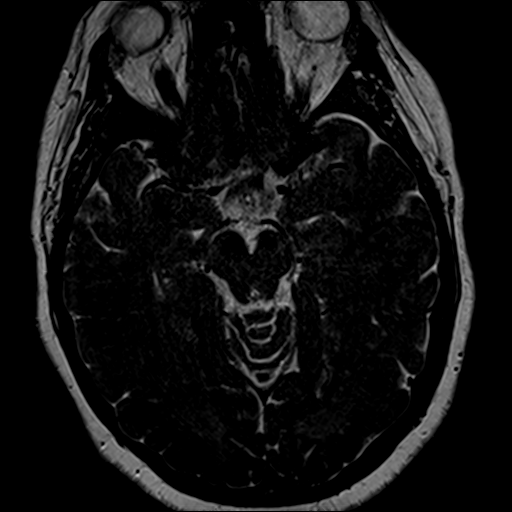

[Series 9: T1 · axial · 3.0mm · 0.39mm/px · z∈[-11,+22]mm · 2 of 11 slices shown (3 of 4)]
[im 1/11]
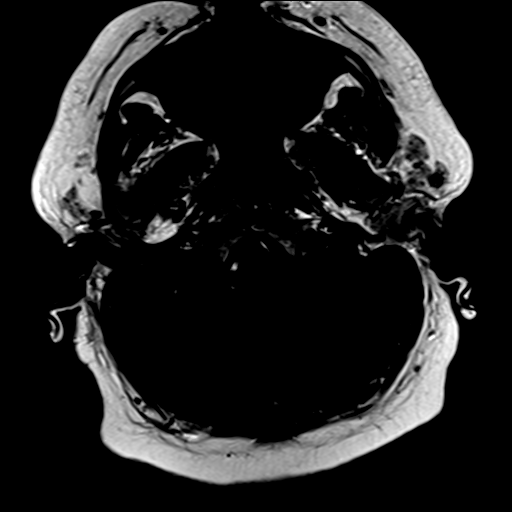
[im 11/11]
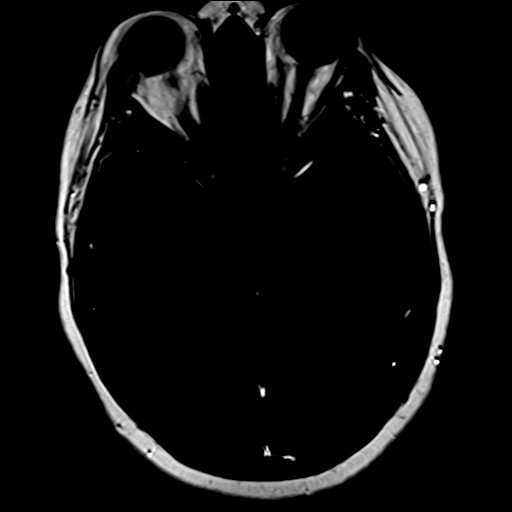

[Series 10: T1 · axial · 3.0mm · 0.39mm/px · 1 of 11 slices shown (4 of 4)]
[im 1/11]
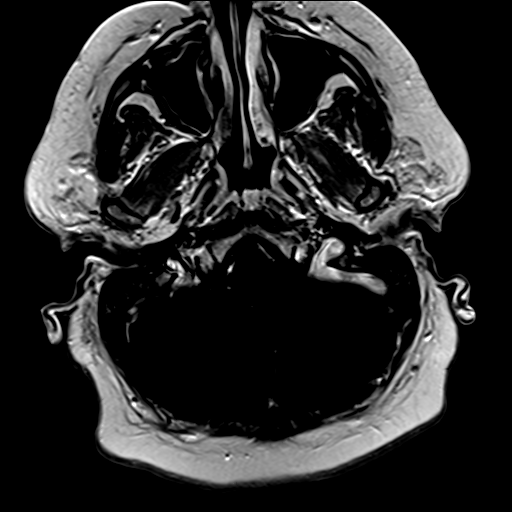

[Series 100: DWI · axial · 3.0mm · 1.80mm/px · z∈[-34,+126]mm · 7 of 42 slices shown]
[im 1/42]
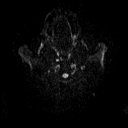
[im 7/42]
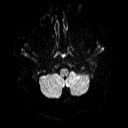
[im 14/42]
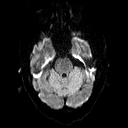
[im 21/42]
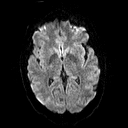
[im 28/42]
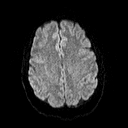
[im 35/42]
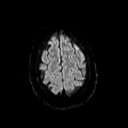
[im 42/42]
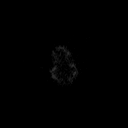

[Series 101: ADC · axial · 3.0mm · 1.80mm/px · z∈[-34,+126]mm · 7 of 42 slices shown]
[im 1/42]
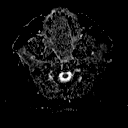
[im 7/42]
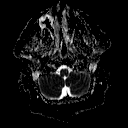
[im 14/42]
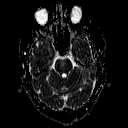
[im 21/42]
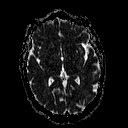
[im 28/42]
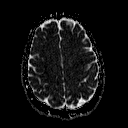
[im 35/42]
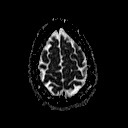
[im 42/42]
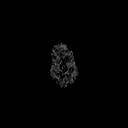

[36 of 48 positions shown; findings below may reference images not displayed]

FINDINGS: There is no evidence of acute infarct, intracranial hemorrhage,
mass, midline shift, or extra-axial fluid collection. The ventricles
and sulci are normal. The brain is normal in signal. No abnormal
enhancement is identified.

Orbits are unremarkable. There is mild circumferential mucosal
thickening in the right maxillary sinus, and small volume secretions
are present in the left maxillary sinus. There are moderate mastoid
effusions, right larger than left and with mild enhancement. Major
intracranial vascular flow voids are preserved.

Dedicated imaging through the internal auditory canals demonstrates
a normal course of cranial nerves VII and VIII without evidence of
mass or abnormal enhancement. Inner ear structures demonstrate
normal signal bilaterally. No mass is seen within the
cerebellopontine angles.
IMPRESSION: 1. Unremarkable appearance of the internal auditory canals. No
evidence of vestibular schwannoma.
2. Right larger than left mastoid effusions.
3. Unremarkable appearance of the brain.

## 2018-06-06 MED ORDER — ONDANSETRON 4 MG PO TBDP: 4.0000 mg | ORAL_TABLET | Freq: Three times a day (TID) | ORAL | 1 refills | Status: DC | PRN

## 2018-06-06 NOTE — Progress Notes (Signed)
Subjective:    Patient ID: Carol Collins, female    DOB: [birth year], 42 y.o.   MRN: [id number]  Carol Collins is a 42 y.o. female presenting on 06/06/2018 for Nausea (nauseated x 4 days, intermittent vomiting x 3 days and lack of appetite x 6 days. pt lose 10 pounds in the last 5 days)   HPI   NAUSEA w/ VOMITING (Presumed Cyclical) / Reduced Appetite / History of Type 2 Diabetes Recent course reviewed, as patient has been contacting office on similar issues via mychart, she has history of diabetes and has been on Bydureon GLP for >1-2 years. Also had been on Metformin in past, more recently earlier in 2019, she was having issues with episodic spontaneous vomiting without nausea in AM, she we linked this to Metformin, we discontinued Metformin and she did much better the vomiting episodes resolved. She was doing better until recently when she contacted [place] via MyChart for advice on nausea vomiting (see separate mychart note) - Now new acute problem onset about 5 days ago with more severe symptoms of nausea, vomiting, anorexia with no appetite, and dry heaving - triggered by sensitive to fumes and scents, initially started with brisket - constant 24 hours, has occasional lessening of symptoms at times but seems to have some symptoms most of each day, without improvement or worsening at this time. Describes stomach cramping immediately before vomiting, and otherwise no abdominal pain - She never started Orlistat due to cost, from prior note. She did take Diflucan recently for yeast infection which improved - Her last dose of Bydureon was a week ago on Tues 7/9. She did not take it this week due to needing refill. She chose to hold it as well thought it may help reduce her symptoms. But normally bydureon causes her to feel "full faster" but not "reduced appetite", so she think this is different - She does smoke marijuana at times, and admits recent use few days ago but this was after symptoms have  already started - Admits some feelings of lightheadedness related. Admits weight loss 10 lbs in 5 days due to reduced appetite and limited eating, she described only eats very small amount few bites here and there. Was tolerating fluids okay until recently. - No history of gallbladder problem in past. Only abdominal surgery abdominal hysterectomy / c-section in past - Denies any fever chills, sweats, diarrhea, dark stool blood in stool, heartburn GERD   Past Surgical History:  Procedure Laterality Date  . ABDOMINAL HYSTERECTOMY    . CESAREAN SECTION    . DIAGNOSTIC LAPAROSCOPY    . laproscopy    . MYRINGOTOMY WITH TUBE PLACEMENT Bilateral 08/31/2016   Procedure: MYRINGOTOMY WITH TUBE PLACEMENT;  Surgeon: Bud Face, MD;  Location: [facility] ORS;  Service: ENT;  Laterality: Bilateral;     Depression screen [facility] 2/9 02/28/2018 11/21/2016 11/09/2015  Decreased Interest 0 0 0  Down, Depressed, Hopeless 0 0 0  PHQ - 2 Score 0 0 0    Social History   Tobacco Use  . Smoking status: Current Some Day Smoker    Packs/day: 0.25    Years: 17.00    Pack years: 4.25    Types: Cigarettes  . Smokeless tobacco: Current User  Substance Use Topics  . Alcohol use: No  . Drug use: Yes    Types: Marijuana    Comment: Regular use marijuana now for pain    Review of Systems Per HPI unless specifically indicated above  Objective:    BP 130/74 (BP Location: Left Arm, Patient Position: Sitting, Cuff Size: Normal)   Pulse 78   Temp 98.2 F (36.8 C) (Oral)   Ht 5\' 1"  (1.549 m)   Wt 188 lb 3.2 oz (85.4 kg)   LMP 11/20/2013 Comment: hysterectomy  BMI 35.56 kg/m   Wt Readings from Last 3 Encounters:  06/06/18 188 lb 3.2 oz (85.4 kg)  02/28/18 207 lb (93.9 kg)  01/28/18 203 lb (92.1 kg)    Physical Exam  Constitutional: She is oriented to person, place, and time. She appears well-developed and well-nourished. No distress.  Well-appearing, comfortable, cooperative  HENT:  Head:  Normocephalic and atraumatic.  Slightly dry mucus mem  Eyes: Conjunctivae are normal. Right eye exhibits no discharge. Left eye exhibits no discharge.  Cardiovascular: Normal rate, regular rhythm, normal heart sounds and intact distal pulses.  No murmur heard. Pulmonary/Chest: Effort normal and breath sounds normal. No respiratory distress. She has no wheezes. She has no rales.  Abdominal: Soft. She exhibits no distension and no mass. There is no tenderness. There is no guarding.  Abdominal obesity with large pannus excess tissue No RUQ abdominal pain on inspiration negative Murphy's No RLQ pain No hepatosplenomegaly appreciated  Slightly hypoactive bowel sounds  Musculoskeletal: Normal range of motion. She exhibits no edema.  Neurological: She is alert and oriented to person, place, and time.  Skin: Skin is warm and dry. No rash noted. She is not diaphoretic. No erythema.  Psychiatric: She has a normal mood and affect. Her behavior is normal.  Well groomed, good eye contact, normal speech and thoughts  Nursing note and vitals reviewed.  Results for orders placed or performed in visit on 02/28/18  POCT HgB A1C  Result Value Ref Range   Hemoglobin A1C 5.9 (A) 5.7      Assessment & Plan:   Problem List Items Addressed This Visit    None    Visit Diagnoses    Intractable cyclical vomiting with nausea    -  Primary   Relevant Medications   ondansetron (ZOFRAN ODT) 4 MG disintegrating tablet      Clinically most consistent with a cyclical vomiting type presentation given her specific symptoms, seems more generalized and does not seem to be exactly related to dietary etiology. She has more general triggers with scents and almost food aversion affecting her as well. Concern with weight loss in brief period of time with very limited PO intake, at risk of dehydration. - It is unclear if this is related to GLP1 Bydureon side effect since this is a chronic med for her, but it is still  likely related. - Exam otherwise is benign abdomen, no evidence of acute abdominal presentation today, no active nausea vomiting or abdominal pain. No other GI red flags except anorexia/early satiety, wt loss  Plan Discussion on approach for her recent symptoms - given prior history of similar but different vomiting in past, this may be related but seems more severe now - will treat this as a separate episode for now. - Recommend first trying anti-emetic therapy to see if it can be reduced or controlled - start Zofran ODT 4-8mg  TID PRN for now to see if can control it and break cycle and reintroduce food/regular diet, improve nutrition and hydration - Not consistent with IBS type symptoms, will hold off on other abdominal indigestion / cramping treatments now - She may remain off GLP Bydureon for another 1-2 weeks to see if this helps her  symptoms as well - Also advised caution with marijuana this can affect her appetite and nausea, even if it helps treat these, coming off of it can cause some worsening - No imaging today - given benign exam, would consider complete abdomen US vs KUB in next 1-2 weeks if needed - Follow-up as advised within 1-2 weeks with update, then return if significant worsening  Ultimately if unable to identify problem, consider referral to GI for considering gastroparesis as possibility   Meds ordered this encounter  Medications  . ondansetron (ZOFRAN ODT) 4 MG disintegrating tablet    Sig: Take 1-2 tablets (4-8 mg total) by mouth every 8 (eight) hours as needed for nausea or vomiting.    Dispense:  30 tablet    Refill:  1    Follow up plan: Return if symptoms worsen or fail to improve, for nausea, reduced appetite.  Saralyn Pilar, DO [facility] 06/06/2018, 1:04 PM

## 2018-06-06 NOTE — Patient Instructions (Addendum)
Thank you for coming to the office today.  Uncertani exact cause of your symptoms  Recommend trying to break the cycle of vomiting/nausea - this may end up being the cause - "Cyclical Vomitingn Syndrome" can last for days - goal is to break the cycle and get food back in your system.  Try smaller frequent meals - avoid taste / smells that trigger symptoms as best you can  May still HOLD Bydureon for now - this could be contributing to symptoms  Future we can consider Abdominal US for further evaluation - and also GI Referral if concerned about gastroparesis and other testing.  Please schedule a Follow-up Appointment to: Return if symptoms worsen or fail to improve, for nausea, reduced appetite.  If you have any other questions or concerns, please feel free to call the office or send a message through MyChart. You may also schedule an earlier appointment if necessary.  Additionally, you may be receiving a survey about your experience at our office within a few days to 1 week by e-mail or mail. We value your feedback.  Saralyn PilarAlexander Doral Digangi, DO [facility] [place] [facility]

## 2018-06-25 ENCOUNTER — Encounter: Payer: Self-pay | Admitting: Family Medicine

## 2018-07-05 ENCOUNTER — Other Ambulatory Visit: Payer: Self-pay | Admitting: Family Medicine

## 2018-07-05 DIAGNOSIS — B373 Candidiasis of vulva and vagina: Secondary | ICD-10-CM

## 2018-07-05 DIAGNOSIS — B3731 Acute candidiasis of vulva and vagina: Secondary | ICD-10-CM

## 2018-07-23 ENCOUNTER — Other Ambulatory Visit: Payer: Self-pay | Admitting: Family Medicine

## 2018-07-23 DIAGNOSIS — I10 Essential (primary) hypertension: Secondary | ICD-10-CM

## 2018-08-27 ENCOUNTER — Other Ambulatory Visit: Payer: Medicaid Other

## 2018-09-02 ENCOUNTER — Encounter: Payer: Medicaid Other | Admitting: Family Medicine

## 2018-09-09 ENCOUNTER — Other Ambulatory Visit: Payer: Medicaid Other

## 2018-09-11 ENCOUNTER — Other Ambulatory Visit: Payer: Medicaid Other

## 2018-09-12 ENCOUNTER — Encounter: Payer: Medicaid Other | Admitting: Family Medicine

## 2018-09-20 ENCOUNTER — Encounter: Payer: Medicaid Other | Admitting: Family Medicine

## 2018-10-14 ENCOUNTER — Encounter: Payer: Medicaid Other | Admitting: Family Medicine

## 2018-10-14 ENCOUNTER — Other Ambulatory Visit: Payer: Self-pay | Admitting: Nurse Practitioner

## 2018-10-14 ENCOUNTER — Other Ambulatory Visit: Payer: Self-pay | Admitting: Family Medicine

## 2018-10-14 DIAGNOSIS — R600 Localized edema: Secondary | ICD-10-CM

## 2018-10-14 DIAGNOSIS — I10 Essential (primary) hypertension: Secondary | ICD-10-CM

## 2018-10-15 ENCOUNTER — Other Ambulatory Visit: Payer: Medicaid Other

## 2018-10-15 DIAGNOSIS — E1169 Type 2 diabetes mellitus with other specified complication: Secondary | ICD-10-CM

## 2018-10-15 DIAGNOSIS — E1142 Type 2 diabetes mellitus with diabetic polyneuropathy: Secondary | ICD-10-CM

## 2018-10-15 DIAGNOSIS — D509 Iron deficiency anemia, unspecified: Secondary | ICD-10-CM

## 2018-10-15 DIAGNOSIS — I10 Essential (primary) hypertension: Secondary | ICD-10-CM

## 2018-10-15 DIAGNOSIS — Z0000 Encounter for general adult medical examination without abnormal findings: Secondary | ICD-10-CM | POA: Diagnosis not present

## 2018-10-15 DIAGNOSIS — E785 Hyperlipidemia, unspecified: Secondary | ICD-10-CM

## 2018-10-16 LAB — COMPLETE METABOLIC PANEL WITH GFR
AG Ratio: 2 (calc) (ref 1.0–2.5)
ALT: 13 U/L (ref 6–29)
AST: 14 U/L (ref 10–30)
Albumin: 4.3 g/dL (ref 3.6–5.1)
Alkaline phosphatase (APISO): 69 U/L (ref 33–115)
BUN: 14 mg/dL (ref 7–25)
CALCIUM: 9.4 mg/dL (ref 8.6–10.2)
CO2: 28 mmol/L (ref 20–32)
CREATININE: 0.82 mg/dL (ref 0.50–1.10)
Chloride: 102 mmol/L (ref 98–110)
GFR, EST NON AFRICAN AMERICAN: 88 mL/min/{1.73_m2} (ref >=60)
GFR, Est African American: 102 mL/min/{1.73_m2} (ref >=60)
GLUCOSE: 85 mg/dL (ref 65–99)
Globulin: 2.2 g/dL (calc) (ref 1.9–3.7)
Potassium: 4 mmol/L (ref 3.5–5.3)
SODIUM: 139 mmol/L (ref 135–146)
Total Bilirubin: 1.1 mg/dL (ref 0.2–1.2)
Total Protein: 6.5 g/dL (ref 6.1–8.1)

## 2018-10-16 LAB — CBC WITH DIFFERENTIAL/PLATELET
BASOS PCT: 0.5 %
Basophils Absolute: 54 cells/uL (ref 0–200)
Eosinophils Absolute: 184 cells/uL (ref 15–500)
Eosinophils Relative: 1.7 %
HEMATOCRIT: 44.9 % (ref 35.0–45.0)
Hemoglobin: 15.3 g/dL (ref 11.7–15.5)
LYMPHS ABS: 2365 {cells}/uL (ref 850–3900)
MCH: 31.9 pg (ref 27.0–33.0)
MCHC: 34.1 g/dL (ref 32.0–36.0)
MCV: 93.7 fL (ref 80.0–100.0)
MPV: 10.9 fL (ref 7.5–12.5)
Monocytes Relative: 5.1 %
NEUTROS PCT: 70.8 %
Neutro Abs: 7646 cells/uL (ref 1500–7800)
PLATELETS: 222 10*3/uL (ref 140–400)
RBC: 4.79 10*6/uL (ref 3.80–5.10)
RDW: 12.9 % (ref 11.0–15.0)
TOTAL LYMPHOCYTE: 21.9 %
WBC: 10.8 10*3/uL (ref 3.8–10.8)
WBCMIX: 551 {cells}/uL (ref 200–950)

## 2018-10-16 LAB — LIPID PANEL
Cholesterol: 161 mg/dL (ref <200)
HDL: 42 mg/dL — ABNORMAL LOW (ref >50)
LDL CHOLESTEROL (CALC): 98 mg/dL
Non-HDL Cholesterol (Calc): 119 mg/dL (calc) (ref <130)
TRIGLYCERIDES: 111 mg/dL (ref <150)
Total CHOL/HDL Ratio: 3.8 (calc) (ref <5.0)

## 2018-10-16 LAB — TSH: TSH: 0.27 m[IU]/L — AB

## 2018-10-16 LAB — HEMOGLOBIN A1C
EAG (MMOL/L): 6.2 (calc)
HEMOGLOBIN A1C: 5.5 %{Hb} (ref <5.7)
Mean Plasma Glucose: 111 (calc)

## 2018-10-21 ENCOUNTER — Ambulatory Visit (INDEPENDENT_AMBULATORY_CARE_PROVIDER_SITE_OTHER): Payer: Medicaid Other | Admitting: Family Medicine

## 2018-10-21 ENCOUNTER — Encounter: Payer: Self-pay | Admitting: Family Medicine

## 2018-10-21 VITALS — BP 119/63 | HR 76 | Temp 98.0°F | Resp 16 | Ht 61.0 in | Wt 195.0 lb

## 2018-10-21 DIAGNOSIS — R600 Localized edema: Secondary | ICD-10-CM

## 2018-10-21 DIAGNOSIS — E1142 Type 2 diabetes mellitus with diabetic polyneuropathy: Secondary | ICD-10-CM

## 2018-10-21 DIAGNOSIS — Z0000 Encounter for general adult medical examination without abnormal findings: Secondary | ICD-10-CM | POA: Diagnosis not present

## 2018-10-21 DIAGNOSIS — I10 Essential (primary) hypertension: Secondary | ICD-10-CM

## 2018-10-21 DIAGNOSIS — Z23 Encounter for immunization: Secondary | ICD-10-CM

## 2018-10-21 DIAGNOSIS — E785 Hyperlipidemia, unspecified: Secondary | ICD-10-CM

## 2018-10-21 DIAGNOSIS — E1169 Type 2 diabetes mellitus with other specified complication: Secondary | ICD-10-CM

## 2018-10-21 NOTE — Patient Instructions (Addendum)
Thank you for coming to the office today.  A1c improved  Recent Labs    02/28/18 0834 10/15/18 0810  HGBA1C 5.9* 5.5   May increase Lasix up to 1.5 pills daily as needed for swelling - before next fill - let us know and we can send new dosing regimen to pharmacy.  REDUCE Bydureon to EVERY OTHER WEEK for now - in future we can change or adjust   Please schedule a Follow-up Appointment to: Return in about 6 months (around 04/22/2019) for DM A1c med adjust.  If you have any other questions or concerns, please feel free to call the office or send a message through MyChart. You may also schedule an earlier appointment if necessary.  Additionally, you may be receiving a survey about your experience at our office within a few days to 1 week by e-mail or mail. We value your feedback.  Carol PilarAlexander Naveah Brave, DO [facility] [place] [facility]

## 2018-10-21 NOTE — Progress Notes (Signed)
Subjective:    Patient ID: Carol Collins, female    DOB: 09/02/1976, 42 y.o.   MRN: [id number]  Carol Collins is a 42 y.o. female presenting on 10/21/2018 for Annual Exam   HPI   Here for Annual Physical and Lab Review  Mild Low TSH Recent lab result showed 0.27. Previously normal. Has not been on thyroid medication in past.  CHRONIC DM, Type 2 / DM Neuropathy / Obesity BMI >36 - Previously was on Bydureon regularly but then had some side effects before with nausea vomiting GI intolerance. She stopped temporarily and then has restarted recently past 3-4 weeks. Doing better now. - Last A1c improved now to 5.5 CBGs: Checks few times weekly Meds: Exenatide ER (Bydureon) 2mg  weekly - OFF Metformin 1000mg  BID due to GI intolerance Reports good compliance. Tolerating well Currently on ACEi Lifestyle: Wt Loss - Dramatically reduced sweet/sugars, she has diet plan with having one serving of any sweet of her choice on Sunday, if she does not eat any during the other 6 days, doing well on this so far. - UTD on last DM Eye Exam [facility], 01/15/18, no DM retinopathy) - Admits episodic numbness tingling in feet w/ temperature change - she stopped Gabapentin before for neuropathy due to memory changes and sedation, she does admit to occasional marijuana use. Previously saw Podiatry Admits some toe cramping Denies hypoglycemia  HTN / Peripheral Edema  Not checking BP Taking lisinopril 5mg  daily, lasix 20mg  as diuretic - Worse if diet changes, salty content in foot can inc swelling - Asking about dose inc on Lasix  Health Maintenance: Due for Flu Shot, will receive today  Declines PNA vaccine  Depression screen [facility] 2/9 10/21/2018 02/28/2018 11/21/2016  Decreased Interest 0 0 0  Down, Depressed, Hopeless 0 0 0  PHQ - 2 Score 0 0 0    Past Medical History:  Diagnosis Date  . Hypertension   . Neuropathy    Past Surgical History:  Procedure Laterality Date  . ABDOMINAL  HYSTERECTOMY    . CESAREAN SECTION    . DIAGNOSTIC LAPAROSCOPY    . laproscopy    . MYRINGOTOMY WITH TUBE PLACEMENT Bilateral 08/31/2016   Procedure: MYRINGOTOMY WITH TUBE PLACEMENT;  Surgeon: Bud Face, MD;  Location: [facility] ORS;  Service: ENT;  Laterality: Bilateral;   Social History   Socioeconomic History  . Marital status: Married    Spouse name: Not on file  . Number of children: Not on file  . Years of education: Not on file  . Highest education level: Not on file  Occupational History  . Not on file  Social Needs  . Financial resource strain: Not on file  . Food insecurity:    Worry: Not on file    Inability: Not on file  . Transportation needs:    Medical: Not on file    Non-medical: Not on file  Tobacco Use  . Smoking status: Current Some Day Smoker    Packs/day: 0.25    Years: 17.00    Pack years: 4.25    Types: Cigarettes  . Smokeless tobacco: Current User  Substance and Sexual Activity  . Alcohol use: No  . Drug use: Yes    Types: Marijuana    Comment: Regular use marijuana now for pain  . Sexual activity: Not on file  Lifestyle  . Physical activity:    Days per week: Not on file    Minutes per session: Not on file  .  Stress: Not on file  Relationships  . Social connections:    Talks on phone: Not on file    Gets together: Not on file    Attends religious service: Not on file    Active member of club or organization: Not on file    Attends meetings of clubs or organizations: Not on file    Relationship status: Not on file  . Intimate partner violence:    Fear of current or ex partner: Not on file    Emotionally abused: Not on file    Physically abused: Not on file    Forced sexual activity: Not on file  Other Topics Concern  . Not on file  Social History Narrative  . Not on file   Family History  Problem Relation Age of Onset  . Heart disease Father   . Diabetes Father   . Hyperlipidemia Father   . Hyperlipidemia Sister   . Diabetes  Mother   . Diabetes Maternal Grandfather    Current Outpatient Medications on File Prior to Visit  Medication Sig  . BYDUREON 2 MG PEN INJECT 1 SYRINGEFUL (2 MG) SUBCUTANEOUSLY ONCE A WEEK  . furosemide (LASIX) 20 MG tablet Take 1-1.5 tablets (20-30 mg total) by mouth daily as needed for edema.  Marland Kitchen. lisinopril (PRINIVIL,ZESTRIL) 5 MG tablet TAKE 1 TABLET BY MOUTH ONCE DAILY   No current facility-administered medications on file prior to visit.     Review of Systems  Constitutional: Negative for activity change, appetite change, chills, diaphoresis, fatigue and fever.  HENT: Negative for congestion and hearing loss.   Eyes: Negative for visual disturbance.  Respiratory: Negative for apnea, cough, choking, chest tightness, shortness of breath and wheezing.   Cardiovascular: Negative for chest pain, palpitations and leg swelling.  Gastrointestinal: Positive for nausea. Negative for abdominal pain, anal bleeding, blood in stool, constipation, diarrhea and vomiting.  Endocrine: Negative for cold intolerance.  Genitourinary: Negative for difficulty urinating, dysuria, frequency and hematuria.  Musculoskeletal: Negative for arthralgias, back pain and neck pain.  Skin: Negative for rash.  Allergic/Immunologic: Negative for environmental allergies.  Neurological: Positive for numbness. Negative for dizziness, weakness, light-headedness and headaches.       Tingling  Hematological: Negative for adenopathy.  Psychiatric/Behavioral: Negative for behavioral problems, dysphoric mood and sleep disturbance. The patient is not nervous/anxious.    Per HPI unless specifically indicated above     Objective:    BP 119/63   Pulse 76   Temp 98 F (36.7 C) (Oral)   Resp 16   Ht 5\' 1"  (1.549 m)   Wt 195 lb (88.5 kg)   LMP 11/20/2013 Comment: hysterectomy  BMI 36.84 kg/m   Wt Readings from Last 3 Encounters:  10/21/18 195 lb (88.5 kg)  06/06/18 188 lb 3.2 oz (85.4 kg)  02/28/18 207 lb (93.9 kg)     Physical Exam  Constitutional: She is oriented to person, place, and time. She appears well-developed and well-nourished. No distress.  Well-appearing, comfortable, cooperative  HENT:  Head: Normocephalic and atraumatic.  Mouth/Throat: Oropharynx is clear and moist.  Eyes: Pupils are equal, round, and reactive to light. Conjunctivae and EOM are normal. Right eye exhibits no discharge. Left eye exhibits no discharge.  Neck: Normal range of motion. Neck supple. No thyromegaly present.  Cardiovascular: Normal rate, regular rhythm, normal heart sounds and intact distal pulses.  No murmur heard. Pulmonary/Chest: Effort normal and breath sounds normal. No respiratory distress. She has no wheezes. She has no rales.  Abdominal:  Soft. Bowel sounds are normal. She exhibits no distension and no mass. There is no tenderness.  Musculoskeletal: Normal range of motion. She exhibits no edema or tenderness.  Upper / Lower Extremities: - Normal muscle tone, strength bilateral upper extremities 5/5, lower extremities 5/5  Lymphadenopathy:    She has no cervical adenopathy.  Neurological: She is alert and oriented to person, place, and time.  Distal sensation intact to light touch all extremities  Skin: Skin is warm and dry. No rash noted. She is not diaphoretic. No erythema.  Psychiatric: She has a normal mood and affect. Her behavior is normal.  Well groomed, good eye contact, normal speech and thoughts  Nursing note and vitals reviewed.  Results for orders placed or performed in visit on 10/15/18  TSH  Result Value Ref Range   TSH 0.27 (L) mIU/L  Lipid panel  Result Value Ref Range   Cholesterol 161 <200 mg/dL   HDL 42 (L) >16 mg/dL   Triglycerides 109 <604 mg/dL   LDL Cholesterol (Calc) 98 mg/dL (calc)   Total CHOL/HDL Ratio 3.8 <5.0 (calc)   Non-HDL Cholesterol (Calc) 119 <130 mg/dL (calc)  COMPLETE METABOLIC PANEL WITH GFR  Result Value Ref Range   Glucose, Bld 85 65 - 99 mg/dL   BUN 14 7  - 25 mg/dL   Creat 5.40 9.81 - 1.91 mg/dL   GFR, Est Non African American 88 > OR = 60 mL/min/1.36m2   GFR, Est African American 102 > OR = 60 mL/min/1.67m2   BUN/Creatinine Ratio NOT APPLICABLE 6 - 22 (calc)   Sodium 139 135 - 146 mmol/L   Potassium 4.0 3.5 - 5.3 mmol/L   Chloride 102 98 - 110 mmol/L   CO2 28 20 - 32 mmol/L   Calcium 9.4 8.6 - 10.2 mg/dL   Total Protein 6.5 6.1 - 8.1 g/dL   Albumin 4.3 3.6 - 5.1 g/dL   Globulin 2.2 1.9 - 3.7 g/dL (calc)   AG Ratio 2.0 1.0 - 2.5 (calc)   Total Bilirubin 1.1 0.2 - 1.2 mg/dL   Alkaline phosphatase (APISO) 69 33 - 115 U/L   AST 14 10 - 30 U/L   ALT 13 6 - 29 U/L  CBC with Differential/Platelet  Result Value Ref Range   WBC 10.8 3.8 - 10.8 Thousand/uL   RBC 4.79 3.80 - 5.10 Million/uL   Hemoglobin 15.3 11.7 - 15.5 g/dL   HCT 47.8 29.5 - 62.1 %   MCV 93.7 80.0 - 100.0 fL   MCH 31.9 27.0 - 33.0 pg   MCHC 34.1 32.0 - 36.0 g/dL   RDW 30.8 65.7 - 84.6 %   Platelets 222 140 - 400 Thousand/uL   MPV 10.9 7.5 - 12.5 fL   Neutro Abs 7,646 1,500 - 7,800 cells/uL   Lymphs Abs 2,365 850 - 3,900 cells/uL   WBC mixed population 551 200 - 950 cells/uL   Eosinophils Absolute 184 15 - 500 cells/uL   Basophils Absolute 54 0 - 200 cells/uL   Neutrophils Relative % 70.8 %   Total Lymphocyte 21.9 %   Monocytes Relative 5.1 %   Eosinophils Relative 1.7 %   Basophils Relative 0.5 %  Hemoglobin A1c  Result Value Ref Range   Hgb A1c MFr Bld 5.5 <5.7 % of total Hgb   Mean Plasma Glucose 111 (calc)   eAG (mmol/L) 6.2 (calc)   Recent Labs    02/28/18 0834 10/15/18 0810  HGBA1C 5.9* 5.5       Assessment &  Plan:   Problem List Items Addressed This Visit    DM type 2 with diabetic peripheral neuropathy (HCC)    Improved DM control A1c down to 5.5, from 5.9. At goal Now on monotherapy GLP1 Bydureon BCise - had stopped for few months Self discontinued Metformin 1000mg  BID due to nausea vomiting  Improved lifestyle diet/exercise - Complication  with DM neuropathy (off gabapentin, side effect, ineffective)  Plan: - REDUCE frequency Bydureon to every OTHER week, 2mg  inj - may DC in future if intolerance still - Remain off Metformin - Check CBGs - Has declined ASA / Statin before - Continue ACEi - UTD DM Eye - Offered Pnuemovax-23 - defer for now Follow-up 6 months A1c      Essential hypertension    Well controlled, on low dose ACEi for DM No complication  Plan: 1. Continue Lisinopril 5mg  daily 2. Follow-up      Relevant Medications   furosemide (LASIX) 20 MG tablet   Hyperlipidemia associated with type 2 diabetes mellitus (HCC)    Mostly controlled cholesterol, improved lifestyle Last lipid panel 09/2018 Calculated ASCVD 10 yr risk score 6.3%  Plan: 1. Discussed ASCVD risk - based on 10 yr score not indicated but for DM patient it is, her risk is higher as smoker 2. Encourage improved lifestyle - low carb/cholesterol, reduce portion size, continue improving regular exercise      Morbid obesity (HCC)    Considered Morbid Obesity due to BMI >36 with comorbid condition DM, HLD  Improved weight loss on GLP1 Bydureon Bcise and improved diet / exercise Encourage continue improved DM diet, with A1c well controlled, reduce portions and monitor caloric intake      Pedal edema    Increase Lasix from 20 to 30mg  - take 1.5 pills now, in future may adjust dose or reduce      Relevant Medications   furosemide (LASIX) 20 MG tablet    Other Visit Diagnoses    Annual physical exam    -  Primary   Needs flu shot       Relevant Orders   Flu Vaccine QUAD 6+ mos PF IM (Fluarix Quad PF) (Completed)      Updated Health Maintenance information Reviewed recent lab results with patient Encouraged improvement to lifestyle with diet and exercise - Goal of weight loss   No orders of the defined types were placed in this encounter.   Follow up plan: Return in about 6 months (around 04/22/2019) for DM A1c med  adjust.  Saralyn Pilar, DO [facility] 10/21/2018, 10:25 AM

## 2018-10-21 NOTE — Assessment & Plan Note (Addendum)
Mostly controlled cholesterol, improved lifestyle Last lipid panel 09/2018 Calculated ASCVD 10 yr risk score 6.3%  Plan: 1. Discussed ASCVD risk - based on 10 yr score not indicated but for DM patient it is, her risk is higher as smoker 2. Encourage improved lifestyle - low carb/cholesterol, reduce portion size, continue improving regular exercise

## 2018-10-21 NOTE — Assessment & Plan Note (Signed)
Considered Morbid Obesity due to BMI >36 with comorbid condition DM, HLD  Improved weight loss on GLP1 Bydureon Bcise and improved diet / exercise Encourage continue improved DM diet, with A1c well controlled, reduce portions and monitor caloric intake

## 2018-10-21 NOTE — Assessment & Plan Note (Signed)
Increase Lasix from 20 to 30mg  - take 1.5 pills now, in future may adjust dose or reduce

## 2018-10-21 NOTE — Assessment & Plan Note (Signed)
Well controlled, on low dose ACEi for DM No complication  Plan: 1. Continue Lisinopril 5mg  daily 2. Follow-up

## 2018-10-21 NOTE — Assessment & Plan Note (Signed)
Improved DM control A1c down to 5.5, from 5.9. At goal Now on monotherapy GLP1 Bydureon BCise - had stopped for few months Self discontinued Metformin 1000mg  BID due to nausea vomiting  Improved lifestyle diet/exercise - Complication with DM neuropathy (off gabapentin, side effect, ineffective)  Plan: - REDUCE frequency Bydureon to every OTHER week, 2mg  inj - may DC in future if intolerance still - Remain off Metformin - Check CBGs - Has declined ASA / Statin before - Continue ACEi - UTD DM Eye - Offered Pnuemovax-23 - defer for now Follow-up 6 months A1c

## 2018-12-11 ENCOUNTER — Telehealth: Payer: Self-pay

## 2018-12-11 NOTE — Telephone Encounter (Signed)
Patient wants to know if she needs to keep her appointment ?

## 2018-12-11 NOTE — Telephone Encounter (Signed)
Attempted to call her, did not reach at 5:15pm. [employer]. Unfortunately she is notifying us at very end of day and her apt is tomorrow Thurs 8:00am. I advised her she should just keep apt at this point, because it would be a same day cancellation otherwise.  Saralyn Pilar, DO [facility] 12/11/2018, 5:23 PM

## 2018-12-12 ENCOUNTER — Ambulatory Visit (INDEPENDENT_AMBULATORY_CARE_PROVIDER_SITE_OTHER): Payer: Medicaid Other | Admitting: Family Medicine

## 2018-12-12 ENCOUNTER — Encounter: Payer: Self-pay | Admitting: Family Medicine

## 2018-12-12 VITALS — BP 106/68 | HR 65 | Temp 98.4°F | Resp 16 | Ht 61.0 in | Wt 198.0 lb

## 2018-12-12 DIAGNOSIS — J0100 Acute maxillary sinusitis, unspecified: Secondary | ICD-10-CM | POA: Diagnosis not present

## 2018-12-12 MED ORDER — AMOXICILLIN-POT CLAVULANATE 875-125 MG PO TABS: 1.0000 | ORAL_TABLET | Freq: Two times a day (BID) | ORAL | 0 refills | Status: DC

## 2018-12-12 NOTE — Patient Instructions (Addendum)
Thank you for coming to the office today.  1. It sounds like you have a Sinusitis (Bacterial Infection) - this most likely started as an Upper Respiratory Virus that has settled into an infection. Allergies can also cause this. - Start Augmentin 1 pill twice daily (breakfast and dinner, with food and plenty of water) for 10 days, complete entire course, do not stop early even if feeling better - Continue Atrovent nasal spray decongestant 2 sprays in each nostril up to 4 times daily for 7 days - Continue nasal steroid Flonase 2 sprays in each nostril daily for 4-6 weeks, may repeat course seasonally or as needed  Continue decongestant as you are  - Improve hydration by drinking plenty of clear fluids (water, gatorade) to reduce secretions and thin congestion - Congestion draining down throat can cause irritation. May try warm herbal tea with honey, cough drops - Can take Tylenol or Ibuprofen as needed for fevers - May continue over the counter cold medicine as you are, I would not use any decongestant or mucinex longer than 7 days.  If you develop persistent fever >101F for at least 3 consecutive days, headaches with sinus pain or pressure or persistent earache, please schedule a follow-up evaluation within next few days to week.   Please schedule a Follow-up Appointment to: Return in about 1 week (around 12/19/2018) for sinusitis.  If you have any other questions or concerns, please feel free to call the office or send a message through MyChart. You may also schedule an earlier appointment if necessary.  Additionally, you may be receiving a survey about your experience at our office within a few days to 1 week by e-mail or mail. We value your feedback.  Saralyn Pilar, DO [facility] [place]

## 2018-12-12 NOTE — Progress Notes (Signed)
Subjective:    Patient ID: Carol Collins, female    DOB: [birth year], 43 y.o.   MRN: [id number]  Carol Collins is a 43 y.o. female presenting on 12/12/2018 for Sinus Problem (sinus pressure, HA, cough onset couple of days )   HPI   SINUSITIS Reports symptoms started about 2.5 weeks ago, her daughter was sick with similar sinusitis, then now patient's symptoms started with congestion and drainage. Then seemed temporary improve but then worsening now more recently with deeper central sinus pain and thicker sinus congestion and drainage when blowing nose, describes more purulent congestion. - Using Flonase regularly and Atrovent PRN for congestion Admits headache - Denies any fever chills sweats, nausea vomiting, diarrhea, abdominal pain, body aches  Health Maintenance: UTD Flu vaccine 10/2018  Depression screen [facility] 2/9 12/12/2018 10/21/2018 02/28/2018  Decreased Interest 0 0 0  Down, Depressed, Hopeless 0 0 0  PHQ - 2 Score 0 0 0    Social History   Tobacco Use  . Smoking status: Current Some Day Smoker    Packs/day: 0.25    Years: 17.00    Pack years: 4.25    Types: Cigarettes  . Smokeless tobacco: Current User  Substance Use Topics  . Alcohol use: No  . Drug use: Yes    Types: Marijuana    Comment: Regular use marijuana now for pain    Review of Systems Per HPI unless specifically indicated above     Objective:    BP 106/68   Pulse 65   Temp 98.4 F (36.9 C) (Oral)   Resp 16   Ht 5\' 1"  (1.549 m)   Wt 198 lb (89.8 kg)   LMP 11/20/2013 Comment: hysterectomy  SpO2 98%   BMI 37.41 kg/m   Wt Readings from Last 3 Encounters:  12/12/18 198 lb (89.8 kg)  10/21/18 195 lb (88.5 kg)  06/06/18 188 lb 3.2 oz (85.4 kg)    Physical Exam Vitals signs and nursing note reviewed.  Constitutional:      General: She is not in acute distress.    Appearance: She is well-developed. She is not diaphoretic.     Comments: Tired-appearing, uncomfortable with sinus pressure,  cooperative, obese  HENT:     Head: Normocephalic and atraumatic.     Comments: Central vs maxillary sinuses mild tender. Nares with turbinate edema and congestion without purulence visible. Bilateral TMs clear without erythema, effusion or bulging - teal colored tymp tubes in place. Oropharynx with posterior drainage, without erythema, exudates, edema or asymmetry. Eyes:     General:        Right eye: No discharge.        Left eye: No discharge.     Conjunctiva/sclera: Conjunctivae normal.  Neck:     Musculoskeletal: Normal range of motion and neck supple.     Thyroid: No thyromegaly.  Cardiovascular:     Rate and Rhythm: Normal rate and regular rhythm.     Heart sounds: Normal heart sounds. No murmur.  Pulmonary:     Effort: Pulmonary effort is normal. No respiratory distress.     Breath sounds: Normal breath sounds. No wheezing or rales.  Musculoskeletal: Normal range of motion.  Lymphadenopathy:     Cervical: No cervical adenopathy.  Skin:    General: Skin is warm and dry.     Findings: No erythema or rash.  Neurological:     Mental Status: She is alert and oriented to person, place, and time.  Psychiatric:  Behavior: Behavior normal.     Comments: Well groomed, good eye contact, normal speech and thoughts    Results for orders placed or performed in visit on 10/15/18  TSH  Result Value Ref Range   TSH 0.27 (L) mIU/L  Lipid panel  Result Value Ref Range   Cholesterol 161 <200 mg/dL   HDL 42 (L) >19>50 mg/dL   Triglycerides 147111 <829<150 mg/dL   LDL Cholesterol (Calc) 98 mg/dL (calc)   Total CHOL/HDL Ratio 3.8 <5.0 (calc)   Non-HDL Cholesterol (Calc) 119 <130 mg/dL (calc)  COMPLETE METABOLIC PANEL WITH GFR  Result Value Ref Range   Glucose, Bld 85 65 - 99 mg/dL   BUN 14 7 - 25 mg/dL   Creat 5.620.82 1.300.50 - 8.651.10 mg/dL   GFR, Est Non African American 88 > OR = 60 mL/min/1.3973m2   GFR, Est African American 102 > OR = 60 mL/min/1.3773m2   BUN/Creatinine Ratio NOT APPLICABLE 6  - 22 (calc)   Sodium 139 135 - 146 mmol/L   Potassium 4.0 3.5 - 5.3 mmol/L   Chloride 102 98 - 110 mmol/L   CO2 28 20 - 32 mmol/L   Calcium 9.4 8.6 - 10.2 mg/dL   Total Protein 6.5 6.1 - 8.1 g/dL   Albumin 4.3 3.6 - 5.1 g/dL   Globulin 2.2 1.9 - 3.7 g/dL (calc)   AG Ratio 2.0 1.0 - 2.5 (calc)   Total Bilirubin 1.1 0.2 - 1.2 mg/dL   Alkaline phosphatase (APISO) 69 33 - 115 U/L   AST 14 10 - 30 U/L   ALT 13 6 - 29 U/L  CBC with Differential/Platelet  Result Value Ref Range   WBC 10.8 3.8 - 10.8 Thousand/uL   RBC 4.79 3.80 - 5.10 Million/uL   Hemoglobin 15.3 11.7 - 15.5 g/dL   HCT 78.444.9 69.635.0 - 29.545.0 %   MCV 93.7 80.0 - 100.0 fL   MCH 31.9 27.0 - 33.0 pg   MCHC 34.1 32.0 - 36.0 g/dL   RDW 28.412.9 13.211.0 - 44.015.0 %   Platelets 222 140 - 400 Thousand/uL   MPV 10.9 7.5 - 12.5 fL   Neutro Abs 7,646 1,500 - 7,800 cells/uL   Lymphs Abs 2,365 850 - 3,900 cells/uL   WBC mixed population 551 200 - 950 cells/uL   Eosinophils Absolute 184 15 - 500 cells/uL   Basophils Absolute 54 0 - 200 cells/uL   Neutrophils Relative % 70.8 %   Total Lymphocyte 21.9 %   Monocytes Relative 5.1 %   Eosinophils Relative 1.7 %   Basophils Relative 0.5 %  Hemoglobin A1c  Result Value Ref Range   Hgb A1c MFr Bld 5.5 <5.7 % of total Hgb   Mean Plasma Glucose 111 (calc)   eAG (mmol/L) 6.2 (calc)      Assessment & Plan:   Problem List Items Addressed This Visit    None    Visit Diagnoses    Acute non-recurrent maxillary sinusitis    -  Primary   Relevant Medications   amoxicillin-clavulanate (AUGMENTIN) 875-125 MG tablet     Consistent with acute maxillary sinusitis, likely initially viral URI vs allergic rhinitis component with worsening concern for bacterial infection after 2nd sickening, from sick contact - Duration >2.5 weeks  Plan: 1. Start Augmentin 875-125mg  PO BID x 10 days 2. Continue Atrovent, Flonase, oral decongestant, NSAID Tylenol 3. Return criteria reviewed   Meds ordered this encounter   Medications  . amoxicillin-clavulanate (AUGMENTIN) 875-125 MG tablet  Sig: Take 1 tablet by mouth 2 (two) times daily. For 10 days    Dispense:  20 tablet    Refill:  0     Follow up plan: Return in about 1 week (around 12/19/2018) for sinusitis.   Saralyn PilarAlexander Umeka Wrench, DO [facility] 12/12/2018, 8:10 AM

## 2019-02-25 DIAGNOSIS — R5383 Other fatigue: Secondary | ICD-10-CM | POA: Diagnosis not present

## 2019-02-25 DIAGNOSIS — B349 Viral infection, unspecified: Secondary | ICD-10-CM | POA: Diagnosis not present

## 2019-02-25 DIAGNOSIS — R110 Nausea: Secondary | ICD-10-CM | POA: Diagnosis not present

## 2019-04-22 ENCOUNTER — Other Ambulatory Visit: Payer: Self-pay

## 2019-04-22 ENCOUNTER — Ambulatory Visit (INDEPENDENT_AMBULATORY_CARE_PROVIDER_SITE_OTHER): Payer: Medicaid Other | Admitting: Family Medicine

## 2019-04-22 ENCOUNTER — Encounter: Payer: Self-pay | Admitting: Family Medicine

## 2019-04-22 VITALS — BP 113/64 | HR 69 | Temp 98.3°F | Resp 16 | Ht 61.0 in | Wt 186.4 lb

## 2019-04-22 DIAGNOSIS — E669 Obesity, unspecified: Secondary | ICD-10-CM | POA: Diagnosis not present

## 2019-04-22 DIAGNOSIS — E1142 Type 2 diabetes mellitus with diabetic polyneuropathy: Secondary | ICD-10-CM | POA: Diagnosis not present

## 2019-04-22 DIAGNOSIS — G4720 Circadian rhythm sleep disorder, unspecified type: Secondary | ICD-10-CM | POA: Diagnosis not present

## 2019-04-22 DIAGNOSIS — R7989 Other specified abnormal findings of blood chemistry: Secondary | ICD-10-CM | POA: Diagnosis not present

## 2019-04-22 MED ORDER — RIGHTEST GL300 LANCETS MISC: 3 refills | Status: DC

## 2019-04-22 MED ORDER — RIGHTEST GS550 BLOOD GLUCOSE VI STRP: ORAL_STRIP | 3 refills | Status: DC

## 2019-04-22 NOTE — Progress Notes (Signed)
Subjective:    Patient ID: Carol Collins, female    DOB: Aug 25, 1976, 43 y.o.   MRN: [id number]  Carol Collins is a 43 y.o. female presenting on 04/22/2019 for Diabetes and Hypothyroidism (may be as per patient )   HPI   Mild Low TSH / Sleep Cycle Abnoramlity Recent lab result showed 0.27. Previously normal. Has not been on thyroid medication in past. Previous results were normal TSH range in 2016 and 2017. She is asking about possible hormone issue causing her symptoms - describes difficulty concentration, abnormal sleep cycle usually 2-3 hours only at a time then wake up and difficulty falling back asleep, if takes melatonin it can keep her asleep for 10 hours but she does not prefer this. Admits heat sensitivity - Admits some generalized tired or weakness at times  CHRONIC DM, Type 2 / DM Neuropathy / Obesity BMI >36 Today doing better on Bydureon weekly, without nausea vomiting - Last A1c improved now to 5.5 09/2018 - due for repeat, will return CBGs: Checksfew times weekly, needs test supplies for Bionime meter Meds: Exenatide ER (Bydureon)  weekly (Back again on weekly without nausea vomiting) - OFF Metformin  BID due to GI intolerance Reports good compliance. Tolerating well Currently on ACEi Lifestyle: - Weight loss has been steady and then now stagnant, she has overhauled diet plan Denies hypoglycemia   Depression screen [facility] 2/9 04/22/2019 12/12/2018 10/21/2018  Decreased Interest 0 0 0  Down, Depressed, Hopeless 0 0 0  PHQ - 2 Score 0 0 0    Social History   Tobacco Use  . Smoking status: Current Some Day Smoker    Packs/day: 0.25    Years: 17.00    Pack years: 4.25    Types: Cigarettes  . Smokeless tobacco: Current User  Substance Use Topics  . Alcohol use: No  . Drug use: Yes    Types: Marijuana    Comment: Regular use marijuana now for pain    Review of Systems Per HPI unless specifically indicated above     Objective:    BP 113/64   Pulse  69   Temp 98.3 F (36.8 C) (Oral)   Resp 16   Ht  (1.549 m)   Wt 186 lb 6.4 oz (84.6 kg)   LMP 11/20/2013 Comment: hysterectomy  BMI 35.22 kg/m   Wt Readings from Last 3 Encounters:  04/22/19 186 lb 6.4 oz (84.6 kg)  12/12/18 198 lb (89.8 kg)  10/21/18 195 lb (88.5 kg)    Physical Exam Vitals signs and nursing note reviewed.  Constitutional:      General: She is not in acute distress.    Appearance: She is well-developed. She is not diaphoretic.     Comments: Well-appearing, comfortable, cooperative  HENT:     Head: Normocephalic and atraumatic.  Eyes:     General:        Right eye: No discharge.        Left eye: No discharge.     Conjunctiva/sclera: Conjunctivae normal.  Cardiovascular:     Rate and Rhythm: Normal rate.  Pulmonary:     Effort: Pulmonary effort is normal.  Skin:    General: Skin is warm and dry.     Findings: No erythema or rash.  Neurological:     Mental Status: She is alert and oriented to person, place, and time.  Psychiatric:        Behavior: Behavior normal.     Comments: Well groomed,  good eye contact, normal speech and thoughts    Results for orders placed or performed in visit on 10/15/18  TSH  Result Value Ref Range   TSH 0.27 (L) mIU/L  Lipid panel  Result Value Ref Range   Cholesterol 161 <200 mg/dL   HDL 42 (L) >49 mg/dL   Triglycerides 826 <415 mg/dL   LDL Cholesterol (Calc) 98 mg/dL (calc)   Total CHOL/HDL Ratio 3.8 <5.0 (calc)   Non-HDL Cholesterol (Calc) 119 <130 mg/dL (calc)  COMPLETE METABOLIC PANEL WITH GFR  Result Value Ref Range   Glucose, Bld 85 65 - 99 mg/dL   BUN 14 7 - 25 mg/dL   Creat 8.30 9.40 - 7.68 mg/dL   GFR, Est Non African American 88 > OR = 60 mL/min/1.9m2   GFR, Est African American 102 > OR = 60 mL/min/1.28m2   BUN/Creatinine Ratio NOT APPLICABLE 6 - 22 (calc)   Sodium 139 135 - 146 mmol/L   Potassium 4.0 3.5 - 5.3 mmol/L   Chloride 102 98 - 110 mmol/L   CO2 28 20 - 32 mmol/L   Calcium 9.4 8.6  - 10.2 mg/dL   Total Protein 6.5 6.1 - 8.1 g/dL   Albumin 4.3 3.6 - 5.1 g/dL   Globulin 2.2 1.9 - 3.7 g/dL (calc)   AG Ratio 2.0 1.0 - 2.5 (calc)   Total Bilirubin 1.1 0.2 - 1.2 mg/dL   Alkaline phosphatase (APISO) 69 33 - 115 U/L   AST 14 10 - 30 U/L   ALT 13 6 - 29 U/L  CBC with Differential/Platelet  Result Value Ref Range   WBC 10.8 3.8 - 10.8 Thousand/uL   RBC 4.79 3.80 - 5.10 Million/uL   Hemoglobin 15.3 11.7 - 15.5 g/dL   HCT 08.8 11.0 - 31.5 %   MCV 93.7 80.0 - 100.0 fL   MCH 31.9 27.0 - 33.0 pg   MCHC 34.1 32.0 - 36.0 g/dL   RDW 94.5 85.9 - 29.2 %   Platelets 222 140 - 400 Thousand/uL   MPV 10.9 7.5 - 12.5 fL   Neutro Abs 7,646 1,500 - 7,800 cells/uL   Lymphs Abs 2,365 850 - 3,900 cells/uL   WBC mixed population 551 200 - 950 cells/uL   Eosinophils Absolute 184 15 - 500 cells/uL   Basophils Absolute 54 0 - 200 cells/uL   Neutrophils Relative % 70.8 %   Total Lymphocyte 21.9 %   Monocytes Relative 5.1 %   Eosinophils Relative 1.7 %   Basophils Relative 0.5 %  Hemoglobin A1c  Result Value Ref Range   Hgb A1c MFr Bld 5.5 <5.7 % of total Hgb   Mean Plasma Glucose 111 (calc)   eAG (mmol/L) 6.2 (calc)      Assessment & Plan:   Problem List Items Addressed This Visit    Disorder of sleep-wake cycle Clinically uncertain etiology, seems chronic sleep cycle problem, now worse lately Not attributed to underlying anxiety or depression or mood Melatonin works but she thinks it is too effect  Plan - Recommend blood panel as discussed for TSH Free T4 see if this could be factor - May still use low dose melatonin _ Improve Sleep hygiene Follow up in future if want to consider sleep aid, she is not interested at this time, would prefer to avoid this if can naturally improve sleep    DM type 2 with diabetic peripheral neuropathy (HCC) - Primary Previously well controlled Last A1c 5.5 Due now repeat A1c in 1-2 days  Continue on Bydureon GLP1 weekly, tolerating better  now Off metformin Re order test supplies Follow-up as planned likely q 6 months in future    Relevant Medications   RIGHTEST GS550 BLOOD GLUCOSE test strip   Rightest GL300 Lancets MISC   Obesity (BMI 35.0-39.9 without comorbidity) Improved weight loss on lifestyle diet On medication GLP1 Question if plateau in weight is lifestyle related or if it is possibly hormonal as discussed     Other Visit Diagnoses    Low TSH level     Seems less likely to be cause of symptoms, only mild low, prior normal Constellation of symptoms not quite consistent with Hyperthyroidism, has some hypothyroidism symptoms actually  Will re-check TSH Free T4 in 1-2 days, review result If normal, then as discussed, I would consider referral to GYN to discuss hormonal evaluation peri-menopause symptoms      Meds ordered this encounter  Medications  . RIGHTEST [id number] BLOOD GLUCOSE test strip    Sig: Use to check blood sugar up to 2 times daily    Dispense:  200 each    Refill:  3  . Rightest GL300 Lancets MISC    Sig: Use to check blood sugar up to 2 times daily    Dispense:  200 each    Refill:  3    Follow up plan: Return in about 3 months (around 07/23/2019), or if symptoms worsen or fail to improve, for DM, Thyroid, Weight, Sleep.  Future labs ordered for 04/24/19  Saralyn PilarAlexander Taelyn Nemes, DO [facility] 04/22/2019, 11:41 AM

## 2019-04-22 NOTE — Patient Instructions (Addendum)
Thank you for coming to the office today.  Consider referral to GYN if not improving or if normal blood work for hair growth, libido, possible hormone changes, weight and sleep.  Ordered testing supplies today  DUE for FASTING BLOOD WORK (no food or drink after midnight before the lab appointment, only water or coffee without cream/sugar on the morning of)  SCHEDULE "Lab Only" visit in the morning at the clinic for lab draw in 1-2 days  - Make sure Lab Only appointment is at about 1 week before your next appointment, so that results will be available  For Lab Results, once available within 2-3 days of blood draw, you can can log in to MyChart online to view your results and a brief explanation. Also, we can discuss results at next follow-up visit.   Please schedule a Follow-up Appointment to: Return in about 3 months (around 07/23/2019), or if symptoms worsen or fail to improve, for DM, Thyroid, Weight, Sleep.  If you have any other questions or concerns, please feel free to call the office or send a message through MyChart. You may also schedule an earlier appointment if necessary.  Additionally, you may be receiving a survey about your experience at our office within a few days to 1 week by e-mail or mail. We value your feedback.  Saralyn Pilar, DO [facility], [place]

## 2019-04-23 ENCOUNTER — Other Ambulatory Visit: Payer: Self-pay | Admitting: Family Medicine

## 2019-04-23 DIAGNOSIS — E1142 Type 2 diabetes mellitus with diabetic polyneuropathy: Secondary | ICD-10-CM

## 2019-04-23 MED ORDER — ACCU-CHEK FASTCLIX LANCETS MISC: 3 refills | Status: DC

## 2019-04-23 MED ORDER — ACCU-CHEK GUIDE VI STRP: ORAL_STRIP | 3 refills | Status: DC

## 2019-04-23 MED ORDER — GLUCOSE BLOOD VI STRP: ORAL_STRIP | 12 refills | Status: DC

## 2019-04-23 MED ORDER — ACCU-CHEK GUIDE W/DEVICE KIT: 1.0000 | PACK | 0 refills | Status: DC

## 2019-04-23 NOTE — Addendum Note (Signed)
Addended by: Smitty Cords on: 04/23/2019 12:01 PM   Modules accepted: Orders

## 2019-04-24 ENCOUNTER — Other Ambulatory Visit: Payer: Self-pay

## 2019-04-24 DIAGNOSIS — E1142 Type 2 diabetes mellitus with diabetic polyneuropathy: Secondary | ICD-10-CM

## 2019-04-24 DIAGNOSIS — R7989 Other specified abnormal findings of blood chemistry: Secondary | ICD-10-CM | POA: Diagnosis not present

## 2019-04-25 LAB — T4, FREE: Free T4: 1.3 ng/dL (ref 0.8–1.8)

## 2019-04-25 LAB — TSH: TSH: 0.3 mIU/L — ABNORMAL LOW

## 2019-04-25 LAB — HEMOGLOBIN A1C
Hgb A1c MFr Bld: 5 % of total Hgb (ref <5.7)
Mean Plasma Glucose: 97 (calc)
eAG (mmol/L): 5.4 (calc)

## 2019-05-06 ENCOUNTER — Other Ambulatory Visit: Payer: Self-pay | Admitting: Family Medicine

## 2019-05-06 DIAGNOSIS — R600 Localized edema: Secondary | ICD-10-CM

## 2019-06-17 ENCOUNTER — Other Ambulatory Visit: Payer: Self-pay | Admitting: Family Medicine

## 2019-06-17 DIAGNOSIS — E1142 Type 2 diabetes mellitus with diabetic polyneuropathy: Secondary | ICD-10-CM

## 2019-08-01 ENCOUNTER — Other Ambulatory Visit: Payer: Self-pay | Admitting: Family Medicine

## 2019-08-01 DIAGNOSIS — I10 Essential (primary) hypertension: Secondary | ICD-10-CM

## 2019-09-03 ENCOUNTER — Ambulatory Visit: Payer: Medicaid Other | Admitting: Family Medicine

## 2019-09-08 ENCOUNTER — Ambulatory Visit: Payer: Medicaid Other | Admitting: Family Medicine

## 2019-10-21 ENCOUNTER — Ambulatory Visit: Payer: Medicaid Other | Admitting: Family Medicine

## 2019-10-27 ENCOUNTER — Ambulatory Visit: Payer: Medicaid Other | Admitting: Family Medicine

## 2019-11-12 ENCOUNTER — Other Ambulatory Visit: Payer: Self-pay | Admitting: Family Medicine

## 2019-11-12 DIAGNOSIS — I10 Essential (primary) hypertension: Secondary | ICD-10-CM

## 2019-11-12 DIAGNOSIS — R600 Localized edema: Secondary | ICD-10-CM

## 2020-01-05 ENCOUNTER — Other Ambulatory Visit: Payer: Self-pay | Admitting: Family Medicine

## 2020-01-05 DIAGNOSIS — E1142 Type 2 diabetes mellitus with diabetic polyneuropathy: Secondary | ICD-10-CM

## 2020-01-07 ENCOUNTER — Other Ambulatory Visit: Payer: Self-pay | Admitting: Family Medicine

## 2020-01-07 DIAGNOSIS — I10 Essential (primary) hypertension: Secondary | ICD-10-CM

## 2020-01-07 DIAGNOSIS — R600 Localized edema: Secondary | ICD-10-CM

## 2020-02-02 ENCOUNTER — Other Ambulatory Visit: Payer: Self-pay

## 2020-02-02 ENCOUNTER — Encounter: Payer: Self-pay | Admitting: Family Medicine

## 2020-02-02 ENCOUNTER — Ambulatory Visit (INDEPENDENT_AMBULATORY_CARE_PROVIDER_SITE_OTHER): Payer: Medicaid Other | Admitting: Family Medicine

## 2020-02-02 VITALS — BP 130/78 | HR 66 | Temp 97.8°F | Resp 16 | Ht 61.0 in | Wt 183.0 lb

## 2020-02-02 DIAGNOSIS — J019 Acute sinusitis, unspecified: Secondary | ICD-10-CM

## 2020-02-02 DIAGNOSIS — B379 Candidiasis, unspecified: Secondary | ICD-10-CM | POA: Diagnosis not present

## 2020-02-02 DIAGNOSIS — L282 Other prurigo: Secondary | ICD-10-CM

## 2020-02-02 DIAGNOSIS — E1142 Type 2 diabetes mellitus with diabetic polyneuropathy: Secondary | ICD-10-CM

## 2020-02-02 DIAGNOSIS — L600 Ingrowing nail: Secondary | ICD-10-CM

## 2020-02-02 MED ORDER — FLUTICASONE PROPIONATE 50 MCG/ACT NA SUSP: 2.0000 | Freq: Every day | NASAL | 3 refills | Status: DC

## 2020-02-02 MED ORDER — FLUCONAZOLE 150 MG PO TABS: ORAL_TABLET | ORAL | 0 refills | Status: DC

## 2020-02-02 MED ORDER — AMOXICILLIN-POT CLAVULANATE 875-125 MG PO TABS: 1.0000 | ORAL_TABLET | Freq: Two times a day (BID) | ORAL | 0 refills | Status: DC

## 2020-02-02 MED ORDER — BYDUREON BCISE 2 MG/0.85ML ~~LOC~~ AUIJ: 2.0000 mg | AUTO-INJECTOR | SUBCUTANEOUS | 3 refills | Status: DC

## 2020-02-02 NOTE — Progress Notes (Signed)
Subjective:    Patient ID: Carol Collins, female    DOB: 09-08-1976, 44 y.o.   MRN: [id number]  Carol Collins is a 44 y.o. female presenting on 02/02/2020 for Urticaria (need referral to dermatology btw has sinus infection but as per patient seasonal)   HPI   CHRONIC DM, Type 2: Reports due for DM follow-up Meds: Bydureon Pen - out of med, needs refill Reports good compliance. Tolerating well w/o side-effects Currently on ACEi Due for DM Eye exam, will re-schedule Denies hypoglycemia, polyuria, visual changes, numbness or tingling.  Ingrown Toenails Previously Podiatry [facility] [place] - 2018 Dr Amalia Hailey Right great toe, previously removed has remnant left over causes some pain. Has issues with R 2nd toe and L great toe  Dermatology Rash along both sides of neck and face, mostly red itchy skin mostly, has changed masks and still no improvement. Tried OTC cortisone cream with limited relief. Calamine lotion, benadryl. - No significant sweating in this area  Sinusitis / Allergic Rhinitis Admits sinus pressure and pain. Congestion. Reports onset within 1 week. Has some bad smell     Depression screen [facility] 2/9 04/22/2019 12/12/2018 10/21/2018  Decreased Interest 0 0 0  Down, Depressed, Hopeless 0 0 0  PHQ - 2 Score 0 0 0    Social History   Tobacco Use  . Smoking status: Current Some Day Smoker    Packs/day: 0.25    Years: 17.00    Pack years: 4.25    Types: Cigarettes  . Smokeless tobacco: Current User  Substance Use Topics  . Alcohol use: No  . Drug use: Yes    Types: Marijuana    Comment: Regular use marijuana now for pain    Review of Systems Per HPI unless specifically indicated above     Objective:    BP 130/78   Pulse 66   Temp 97.8 F (36.6 C) (Temporal)   Resp 16   Ht 5\' 1"  (1.549 m)   Wt 183 lb (83 kg)   LMP 11/20/2013 Comment: hysterectomy  BMI 34.58 kg/m   Wt Readings from Last 3 Encounters:  02/02/20 183 lb (83 kg)  04/22/19 186 lb 6.4 oz  (84.6 kg)  12/12/18 198 lb (89.8 kg)    Physical Exam Vitals and nursing note reviewed.  Constitutional:      General: She is not in acute distress.    Appearance: She is well-developed. She is obese. She is not diaphoretic.     Comments: Well-appearing, comfortable, cooperative  HENT:     Head: Normocephalic and atraumatic.  Eyes:     General:        Right eye: No discharge.        Left eye: No discharge.     Conjunctiva/sclera: Conjunctivae normal.  Cardiovascular:     Rate and Rhythm: Normal rate.  Pulmonary:     Effort: Pulmonary effort is normal.  Skin:    General: Skin is warm and dry.     Findings: No erythema or rash.  Neurological:     Mental Status: She is alert and oriented to person, place, and time.  Psychiatric:        Behavior: Behavior normal.     Comments: Well groomed, good eye contact, normal speech and thoughts       Diabetic Foot Exam - Simple   Simple Foot Form Diabetic Foot exam was performed with the following findings: Yes 02/02/2020  3:18 PM  Visual Inspection See comments: Yes Sensation  Testing Intact to touch and monofilament testing bilaterally: Yes Pulse Check Posterior Tibialis and Dorsalis pulse intact bilaterally: Yes Comments Bilateral great toes with ingrown nail aspect. R great toenail is absent due to prior removal has remnant remaining medial aspect. Dry skin generalized. No ulceration. R 2nd toe with ingrown vs overgrown nail.     Results for orders placed or performed in visit on 04/24/19  T4, free  Result Value Ref Range   Free T4 1.3 0.8 - 1.8 ng/dL  TSH  Result Value Ref Range   TSH 0.30 (L) mIU/L  Hemoglobin A1c  Result Value Ref Range   Hgb A1c MFr Bld 5.0 <5.7 % of total Hgb   Mean Plasma Glucose 97 (calc)   eAG (mmol/L) 5.4 (calc)      Assessment & Plan:   Problem List Items Addressed This Visit    DM type 2 with diabetic peripheral neuropathy (HCC)   Relevant Medications   BYDUREON BCISE 2 MG/0.85ML AUIJ     Other Relevant Orders   Ambulatory referral to Podiatry    Other Visit Diagnoses    Ingrown left big toenail    -  Primary   Relevant Orders   Ambulatory referral to Podiatry   Pruritic rash       Relevant Orders   Ambulatory referral to Dermatology   Acute rhinosinusitis       Relevant Medications   fluticasone (FLONASE) 50 MCG/ACT nasal spray   amoxicillin-clavulanate (AUGMENTIN) 875-125 MG tablet   fluconazole (DIFLUCAN) 150 MG tablet   Antibiotic-induced yeast infection       Relevant Medications   fluconazole (DIFLUCAN) 150 MG tablet      #acute rhinosinusitis Recent sinus symptoms, worse in past 1 week Similar to prior sinus infection Will provide empiric coverage Augmentin, and add anti fungal if yeast infection Re order Flonase  #Ingrown Toenails, nailbed pain Uncomplicated currently but multiple toenails with nailbed issues Will refer back to Podiatry [facility]  #Rash, pruritic Neck and face without obvious etiology Possibly secondary to mask but she has tried various alternative options did not resolve  #Type 2 Diabetes Due for A1c, will return for dedicated DM visit Re order GLP1 Bydureon BCise, should be approved preferred medicaid option, Bydureon Pen should be phased out, will re order to pharmacy if need to switch back to Bydureon pen can do that in future. DM Foot exam done Should re-schedule DM Eye  Orders Placed This Encounter  Procedures  . Ambulatory referral to Podiatry    Referral Priority:   Routine    Referral Type:   Consultation    Referral Reason:   Specialty Services Required    Requested Specialty:   Podiatry    Number of Visits Requested:   1  . Ambulatory referral to Dermatology    Referral Priority:   Routine    Referral Type:   Consultation    Referral Reason:   Specialty Services Required    Requested Specialty:   Dermatology    Number of Visits Requested:   1     Meds ordered this encounter  Medications  . fluticasone  (FLONASE) 50 MCG/ACT nasal spray    Sig: Place 2 sprays into both nostrils daily. Use for 4-6 weeks then stop and use seasonally or as needed.    Dispense:  16 g    Refill:  3  . amoxicillin-clavulanate (AUGMENTIN) 875-125 MG tablet    Sig: Take 1 tablet by mouth 2 (two) times daily.  Dispense:  20 tablet    Refill:  0  . BYDUREON BCISE 2 MG/0.85ML AUIJ    Sig: Inject 2 mg into the skin once a week.    Dispense:  4 pen    Refill:  3    Medicaid should approve BCise now, otherwise if still not covered by Medicaid, then we can change back to Bydureon Pen  . fluconazole (DIFLUCAN) 150 MG tablet    Sig: Take one tablet by mouth on Day 1. Repeat dose 2nd tablet on Day 3.    Dispense:  2 tablet    Refill:  0      Follow up plan: Return in about 3 months (around 05/04/2020) for 3 month DM A1c OR annual w/ labs.   Saralyn Pilar, DO [facility] 02/02/2020, 3:13 PM

## 2020-02-02 NOTE — Patient Instructions (Addendum)
Thank you for coming to the office today.  Referrals ordered to Podiatry and Dermatology  Switched Bydureon Pen to [facility] device new one, if still an issue w/ insurance let me know we can contact them directly.  Antibiotic for sinuses and Flonase  Printed yeast pill  Return within 3-4 months for sugar diabetic checkup OR annual w/ labs  Please schedule a Follow-up Appointment to: Return in about 3 months (around 05/04/2020) for 3 month DM A1c OR annual w/ labs.  If you have any other questions or concerns, please feel free to call the office or send a message through MyChart. You may also schedule an earlier appointment if necessary.  Additionally, you may be receiving a survey about your experience at our office within a few days to 1 week by e-mail or mail. We value your feedback.  Saralyn Pilar, DO [facility], [place]

## 2020-02-17 ENCOUNTER — Encounter: Payer: Self-pay | Admitting: Podiatry

## 2020-02-17 ENCOUNTER — Other Ambulatory Visit: Payer: Self-pay | Admitting: Podiatry

## 2020-02-17 ENCOUNTER — Other Ambulatory Visit: Payer: Self-pay

## 2020-02-17 ENCOUNTER — Ambulatory Visit (INDEPENDENT_AMBULATORY_CARE_PROVIDER_SITE_OTHER): Payer: Medicaid Other

## 2020-02-17 ENCOUNTER — Ambulatory Visit (INDEPENDENT_AMBULATORY_CARE_PROVIDER_SITE_OTHER): Payer: Medicaid Other | Admitting: Podiatry

## 2020-02-17 DIAGNOSIS — G609 Hereditary and idiopathic neuropathy, unspecified: Secondary | ICD-10-CM

## 2020-02-17 DIAGNOSIS — G629 Polyneuropathy, unspecified: Secondary | ICD-10-CM

## 2020-02-17 DIAGNOSIS — L600 Ingrowing nail: Secondary | ICD-10-CM | POA: Diagnosis not present

## 2020-02-17 DIAGNOSIS — M216X1 Other acquired deformities of right foot: Secondary | ICD-10-CM

## 2020-02-17 DIAGNOSIS — M21629 Bunionette of unspecified foot: Secondary | ICD-10-CM

## 2020-02-17 DIAGNOSIS — M216X2 Other acquired deformities of left foot: Secondary | ICD-10-CM | POA: Diagnosis not present

## 2020-02-17 NOTE — Progress Notes (Signed)
   Subjective: Patient presents today for evaluation of pain to the medial border of the right second toe as well as the medial border of the left hallux.  She has a history of a total permanent nail avulsion to the right hallux however she developed a recurrent nail spicule to the medial border that is now growing and bothersome.  This is been ongoing now for the past 2-3 months.  Patient is concerned for possible ingrown nail. Patient presents today for further treatment and evaluation.  Past Medical History:  Diagnosis Date  . Hypertension   . Neuropathy     Objective:  General: Well developed, nourished, in no acute distress, alert and oriented x3   Dermatology: Skin is warm, dry and supple bilateral.  Medial border of the right second toe as well as the medial border of the left hallux and the present nail spicule to the right medial hallux appears to be erythematous with evidence of an ingrowing nail. Pain on palpation noted to the border of the nail fold. The remaining nails appear unremarkable at this time. There are no open sores, lesions.  Vascular: Dorsalis Pedis artery and Posterior Tibial artery pedal pulses palpable. No lower extremity edema noted.   Neruologic: Grossly intact via light touch bilateral.  Musculoskeletal: Muscular strength within normal limits in all groups bilateral. Normal range of motion noted to all pedal and ankle joints.   Assesement: #1 Paronychia with ingrowing nail right second toe medial border #2  Paronychia with ingrowing nail left hallux medial border #3  Recurrent nail spicule right hallux medial border  Plan of Care:  1. Patient evaluated.  2. Discussed treatment alternatives and plan of care. Explained nail avulsion procedure and post procedure course to patient. 3. Patient opted for permanent partial nail avulsion of the above-mentioned ingrown toenails.  4. Prior to procedure, local anesthesia infiltration utilized using 3 ml of a 50:50  mixture of 2% plain lidocaine and 0.5% plain marcaine in a normal hallux block fashion and a betadine prep performed.  5. Partial permanent nail avulsion with chemical matrixectomy performed using 3x30sec applications of phenol followed by alcohol flush.  6.  Prescription for gentamicin cream  7.  Light dressing applied. 8. Return to clinic 2 weeks.  Felecia Shelling, DPM [facility]  Dr. Felecia Shelling, DPM    [address]                                        [place], [place] [address]                Office [phone]  Fax [phone]

## 2020-02-17 NOTE — Patient Instructions (Signed)

## 2020-02-18 DIAGNOSIS — E1142 Type 2 diabetes mellitus with diabetic polyneuropathy: Secondary | ICD-10-CM

## 2020-02-19 MED ORDER — VICTOZA 18 MG/3ML ~~LOC~~ SOPN: PEN_INJECTOR | SUBCUTANEOUS | 2 refills | Status: DC

## 2020-02-19 MED ORDER — VICTOZA 18 MG/3ML ~~LOC~~ SOPN: 0.6000 mg | PEN_INJECTOR | Freq: Every day | SUBCUTANEOUS | 2 refills | Status: DC

## 2020-02-19 MED ORDER — NOVOFINE PLUS 32G X 4 MM MISC: 2 refills | Status: DC

## 2020-02-19 NOTE — Addendum Note (Signed)
Addended by: Smitty Cords on: 02/19/2020 09:13 AM   Modules accepted: Orders

## 2020-03-02 ENCOUNTER — Ambulatory Visit: Payer: Medicaid Other | Admitting: Podiatry

## 2020-03-30 ENCOUNTER — Encounter: Payer: Self-pay | Admitting: Podiatry

## 2020-04-06 ENCOUNTER — Other Ambulatory Visit: Payer: Self-pay | Admitting: Podiatry

## 2020-04-06 ENCOUNTER — Ambulatory Visit (INDEPENDENT_AMBULATORY_CARE_PROVIDER_SITE_OTHER): Payer: Medicaid Other | Admitting: Podiatry

## 2020-04-06 ENCOUNTER — Other Ambulatory Visit: Payer: Self-pay

## 2020-04-06 ENCOUNTER — Ambulatory Visit (INDEPENDENT_AMBULATORY_CARE_PROVIDER_SITE_OTHER): Payer: Medicaid Other

## 2020-04-06 DIAGNOSIS — S99822A Other specified injuries of left foot, initial encounter: Secondary | ICD-10-CM | POA: Diagnosis not present

## 2020-04-06 DIAGNOSIS — L603 Nail dystrophy: Secondary | ICD-10-CM

## 2020-04-06 DIAGNOSIS — M7752 Other enthesopathy of left foot: Secondary | ICD-10-CM | POA: Diagnosis not present

## 2020-04-06 DIAGNOSIS — S99922A Unspecified injury of left foot, initial encounter: Secondary | ICD-10-CM

## 2020-04-06 MED ORDER — MELOXICAM 15 MG PO TABS: 15.0000 mg | ORAL_TABLET | Freq: Every day | ORAL | 1 refills | Status: DC

## 2020-04-09 NOTE — Progress Notes (Signed)
   Subjective: 44 y.o. female presenting today with a chief complaint of sharp pain to the left third toe that began one week ago. She states she dropped a shampoo bottle on the toe and is now experiencing redness, pain and swelling. Touching and bending the toe increases the pain. She has not had any treatment for the symptoms.  She also notes the left great toenail is lifting up from the nailbed that began a few months ago. She reports associated redness and pain with touching. She has not had any treatment. Patient is here for further evaluation and treatment.    Past Medical History:  Diagnosis Date  . Hypertension   . Neuropathy     Objective:  General: Well developed, nourished, in no acute distress, alert and oriented x3   Dermatology: Hyperkeratotic, discolored, thickened, onychodystrophy of the left great toenail. Skin is warm, dry and supple bilateral lower extremities. Negative for open lesions or macerations.  Vascular: Dorsalis Pedis artery and Posterior Tibial artery pedal pulses palpable. No lower extremity edema noted.   Neruologic: Grossly intact via light touch bilateral.  Musculoskeletal: Pain with palpation and edema noted to the left 3rd toe. Muscular strength within normal limits in all groups bilateral. Normal range of motion noted to all pedal and ankle joints.   Radiographic Exam:  Normal osseous mineralization. Joint spaces preserved. No fracture/dislocation/boney destruction.    Assessment:  #1 Dystrophic nail of the left hallux #2 Toe capsulitis secondary to stubbing left third toe   Plan of Care:  1. Patient evaluated.  2. Discussed treatment alternatives and plan of care. Explained nail avulsion procedure and post procedure course to patient. 3. Patient opted for total temporary nail avulsion of the left great toe.  4. Prior to procedure, local anesthesia infiltration utilized using 3 ml of a 50:50 mixture of 2% plain lidocaine and 0.5% plain marcaine in  a normal hallux block fashion and a betadine prep performed.  5. Light dressing applied. 6. Prescription for Meloxicam provided to patient. 7. Recommended good shoe gear.  8. Return to clinic as needed.   Felecia Shelling, DPM [facility]  Dr. Felecia Shelling, DPM    [address]                                        [place], [place] [address]                Office [phone]  Fax [phone]

## 2020-05-21 ENCOUNTER — Other Ambulatory Visit: Payer: Self-pay | Admitting: Family Medicine

## 2020-05-21 DIAGNOSIS — I10 Essential (primary) hypertension: Secondary | ICD-10-CM

## 2020-05-21 DIAGNOSIS — R600 Localized edema: Secondary | ICD-10-CM

## 2020-05-21 NOTE — Telephone Encounter (Signed)
Requested Prescriptions  Pending Prescriptions Disp Refills  . furosemide (LASIX) 20 MG tablet [Pharmacy Med Name: Furosemide 20 MG Oral Tablet] 90 tablet 0    Sig: TAKE ONE-HALF TO ONE TABLET BY MOUTH AS NEEDED FOR  SWELLING     Cardiovascular:  Diuretics - Loop Failed - 05/21/2020 10:41 AM      Failed - K in normal range and within 360 days    Potassium  Date Value Ref Range Status  10/15/2018 4.0 3.5 - 5.3 mmol/L Final  04/22/2014 3.7 3.5 - 5.1 mmol/L Final         Failed - Ca in normal range and within 360 days    Calcium  Date Value Ref Range Status  10/15/2018 9.4 8.6 - 10.2 mg/dL Final   Calcium, Total  Date Value Ref Range Status  04/22/2014 8.6 8.5 - 10.1 mg/dL Final         Failed - Na in normal range and within 360 days    Sodium  Date Value Ref Range Status  10/15/2018 139 135 - 146 mmol/L Final  11/10/2015 135 134 - 144 mmol/L Final    Comment:                  **Please note reference interval change**  04/22/2014 138 136 - 145 mmol/L Final         Failed - Cr in normal range and within 360 days    Creat  Date Value Ref Range Status  10/15/2018 0.82 0.50 - 1.10 mg/dL Final         Passed - Last BP in normal range    BP Readings from Last 1 Encounters:  02/02/20 130/78         Passed - Valid encounter within last 6 months    Recent Outpatient Visits          3 months ago Ingrown left big toenail   [facility] Smitty Cords, DO   1 year ago DM type 2 with diabetic peripheral neuropathy [facility])   [facility] Smitty Cords, DO   1 year ago Acute non-recurrent maxillary sinusitis   [facility] Smitty Cords, DO   1 year ago Annual physical exam   [facility] Smitty Cords, DO   1 year ago Intractable cyclical vomiting with nausea   [facility], Netta Neat, DO             . lisinopril (ZESTRIL) 5 MG tablet [Pharmacy  Med Name: Lisinopril 5 MG Oral Tablet] 90 tablet 0    Sig: Take 1 tablet by mouth once daily     Cardiovascular:  ACE Inhibitors Failed - 05/21/2020 10:41 AM      Failed - Cr in normal range and within 180 days    Creat  Date Value Ref Range Status  10/15/2018 0.82 0.50 - 1.10 mg/dL Final         Failed - K in normal range and within 180 days    Potassium  Date Value Ref Range Status  10/15/2018 4.0 3.5 - 5.3 mmol/L Final  04/22/2014 3.7 3.5 - 5.1 mmol/L Final         Passed - Patient is not pregnant      Passed - Last BP in normal range    BP Readings from Last 1 Encounters:  02/02/20 130/78         Passed - Valid encounter  within last 6 months    Recent Outpatient Visits          3 months ago Ingrown left big toenail   [facility], Netta Neat, DO   1 year ago DM type 2 with diabetic peripheral neuropathy [facility])   [facility] Smitty Cords, DO   1 year ago Acute non-recurrent maxillary sinusitis   [facility] Smitty Cords, DO   1 year ago Annual physical exam   [facility] Smitty Cords, DO   1 year ago Intractable cyclical vomiting with nausea   [facility], Netta Neat, [place]

## 2020-07-13 ENCOUNTER — Ambulatory Visit: Payer: Medicaid Other | Admitting: Podiatry

## 2020-08-09 NOTE — Telephone Encounter (Signed)
Elvina Mattes D, CMA  You 6 hours ago (11:42 AM)   Patient advised to schedule an appointment --she has to work out with her work schedule an will call next week to schedule an appointment.   Message text

## 2020-08-16 ENCOUNTER — Ambulatory Visit: Payer: Medicaid Other | Admitting: Family Medicine

## 2020-08-17 ENCOUNTER — Other Ambulatory Visit: Payer: Self-pay

## 2020-08-17 ENCOUNTER — Encounter: Payer: Medicaid Other | Admitting: Podiatry

## 2020-08-20 DIAGNOSIS — Z419 Encounter for procedure for purposes other than remedying health state, unspecified: Secondary | ICD-10-CM | POA: Diagnosis not present

## 2020-08-24 ENCOUNTER — Encounter: Payer: Self-pay | Admitting: Family Medicine

## 2020-08-24 ENCOUNTER — Ambulatory Visit (INDEPENDENT_AMBULATORY_CARE_PROVIDER_SITE_OTHER): Payer: Medicaid Other | Admitting: Family Medicine

## 2020-08-24 ENCOUNTER — Other Ambulatory Visit: Payer: Self-pay

## 2020-08-24 VITALS — BP 128/81 | HR 71 | Temp 97.5°F | Resp 16 | Ht 61.0 in | Wt 204.6 lb

## 2020-08-24 DIAGNOSIS — R233 Spontaneous ecchymoses: Secondary | ICD-10-CM | POA: Diagnosis not present

## 2020-08-24 DIAGNOSIS — E1169 Type 2 diabetes mellitus with other specified complication: Secondary | ICD-10-CM | POA: Diagnosis not present

## 2020-08-24 DIAGNOSIS — G8929 Other chronic pain: Secondary | ICD-10-CM

## 2020-08-24 DIAGNOSIS — E1142 Type 2 diabetes mellitus with diabetic polyneuropathy: Secondary | ICD-10-CM | POA: Diagnosis not present

## 2020-08-24 DIAGNOSIS — M79675 Pain in left toe(s): Secondary | ICD-10-CM | POA: Diagnosis not present

## 2020-08-24 DIAGNOSIS — L282 Other prurigo: Secondary | ICD-10-CM

## 2020-08-24 DIAGNOSIS — R413 Other amnesia: Secondary | ICD-10-CM | POA: Diagnosis not present

## 2020-08-24 DIAGNOSIS — B070 Plantar wart: Secondary | ICD-10-CM | POA: Diagnosis not present

## 2020-08-24 DIAGNOSIS — R7989 Other specified abnormal findings of blood chemistry: Secondary | ICD-10-CM | POA: Diagnosis not present

## 2020-08-24 DIAGNOSIS — E785 Hyperlipidemia, unspecified: Secondary | ICD-10-CM

## 2020-08-24 LAB — POCT GLYCOSYLATED HEMOGLOBIN (HGB A1C): Hemoglobin A1C: 5.3 % (ref 4.0–5.6)

## 2020-08-24 NOTE — Progress Notes (Signed)
Subjective:    Patient ID: Carol Collins, female    DOB: March 02, 1976, 44 y.o.   MRN: [id number]  Carol Collins is a 44 y.o. female presenting on 08/24/2020 for Diabetes   HPI   CHRONIC DM, Type 2 / DM Neuropathy/ Obesity BMI >38 Last visit 01/2020, A1c trend 5-6 range. She had been on Bydureon in past, then was switched to Victoza - Started Victoza back in April 2021, had side effect with nausea upset stomach, stayed at dose 0.6 for past 6 months, with opposite results, had weight gain, and increased appetite, recently now off medicine for 1-2 weeks. - Due for Diabetic Eye Exam Today due for A1c CBGs: Not checking regularly Meds: OFF Victoza past 2 weeks - OFF Metformin 1000mg  BIDdue to GI intolerance Reports good compliance. Tolerating well Currently on ACEi Lifestyle: - Weight gain over 6 months, has had increased appetite on Victoza Denies hypoglycemia  Additional symptoms Feels exhausted, tired, can get 7-8 hours sleep and still wake up exhausted. Attributed to Victoza, since stopped med this has resolved. She does not feel significantly sleepy or easily dozing during day now this has mostly resolved  Additional requests - Needs new Dermatology referral, previously at Dr , they need new refer to other in network dermatology, treated for pruritic rash in past, also admits some petechial rash on lower extremity and possible plantar wart.  Podiatry - Known diabetic neuropathy see above, also has L middle toe pain from injury back in 03/2020, had x-ray at that time Dr 04/2020, negative. Still pain asking for additional help. Needs new referral to in network apt  Memory gradual issue worsening lately, difficult to know onset, not acute, says unsure if physical medical cause or if mental emotional stress. Multiple stressors.    Depression screen [facility] 2/9 08/25/2020 04/22/2019 12/12/2018  Decreased Interest 0 0 0  Down, Depressed, Hopeless 0 0 0  PHQ - 2 Score 0 0 0     Social History   Tobacco Use  . Smoking status: Current Some Day Smoker    Packs/day: 0.25    Years: 17.00    Pack years: 4.25    Types: Cigarettes  . Smokeless tobacco: Current User  Vaping Use  . Vaping Use: Some days  . Substances: THC  Substance Use Topics  . Alcohol use: No  . Drug use: Yes    Types: Marijuana    Comment: Regular use marijuana now for pain    Review of Systems Per HPI unless specifically indicated above     Objective:    BP 128/81   Pulse 71   Temp (!) 97.5 F (36.4 C) (Temporal)   Resp 16   Ht 5\' 1"  (1.549 m)   Wt 204 lb 9.6 oz (92.8 kg)   LMP 11/20/2013 Comment: hysterectomy  SpO2 100%   BMI 38.66 kg/m   Wt Readings from Last 3 Encounters:  08/24/20 204 lb 9.6 oz (92.8 kg)  02/02/20 183 lb (83 kg)  04/22/19 186 lb 6.4 oz (84.6 kg)    Physical Exam Vitals and nursing note reviewed.  Constitutional:      General: She is not in acute distress.    Appearance: She is well-developed. She is not diaphoretic.     Comments: Well-appearing, comfortable, cooperative  HENT:     Head: Normocephalic and atraumatic.  Eyes:     General:        Right eye: No discharge.        Left  eye: No discharge.     Conjunctiva/sclera: Conjunctivae normal.  Neck:     Thyroid: No thyromegaly.  Cardiovascular:     Rate and Rhythm: Normal rate and regular rhythm.     Heart sounds: Normal heart sounds. No murmur heard.   Pulmonary:     Effort: Pulmonary effort is normal. No respiratory distress.     Breath sounds: Normal breath sounds. No wheezing or rales.  Musculoskeletal:        General: Normal range of motion.     Cervical back: Normal range of motion and neck supple.  Lymphadenopathy:     Cervical: No cervical adenopathy.  Skin:    General: Skin is warm and dry.     Findings: No erythema or rash.  Neurological:     Mental Status: She is alert and oriented to person, place, and time.  Psychiatric:        Behavior: Behavior normal.      Comments: Well groomed, good eye contact, normal speech and thoughts      I have personally reviewed the radiology report from 04/06/20 on Left foot x-ray.  Radiographic Exam:  Normal osseous mineralization. Joint spaces preserved. No fracture/dislocation/boney destruction.    Diabetic Foot Exam - Simple   Simple Foot Form Diabetic Foot exam was performed with the following findings: Yes 08/24/2020 11:42 AM  Visual Inspection See comments: Yes Sensation Testing See comments: Yes Pulse Check Posterior Tibialis and Dorsalis pulse intact bilaterally: Yes Comments Bilateral feet with dry skin callus formation, s/p removal great toenails, has mostly reduced monofilament sensation, especially R side worse. Has deformity of left middle toe distally and R 2nd toe.      Results for orders placed or performed in visit on 08/24/20  POCT HgB A1C  Result Value Ref Range   Hemoglobin A1C 5.3 4.0 - 5.6 %      Assessment & Plan:   Problem List Items Addressed This Visit    Morbid obesity (HCC)    Abnormal weight gain, despite GLP1 therapy, intolerance on this now off      Relevant Orders   COMPLETE METABOLIC PANEL WITH GFR   CBC with Differential/Platelet   TSH   Hyperlipidemia associated with type 2 diabetes mellitus (HCC)    Overdue for lipids Prior mild low HDL, normal LDL Not on statin therapy Return in future for lipids      Relevant Orders   COMPLETE METABOLIC PANEL WITH GFR   DM type 2 with diabetic peripheral neuropathy (HCC) - Primary    Well-controlled DM with A1c 5.3 today, now off GLP1 due to side effect, was on Victoza Complications - peripheral neuropathy, hyperlipidemia, obesity -  increases risk of future cardiovascular complications   Plan:  1. Remain off therapy currently 2. Encourage improved lifestyle - low carb, low sugar diet, reduce portion size, continue improving regular exercise 3. Check CBG , bring log to next visit for review 4. Continue ACEi 5. DM  Foot exam done today / Advised to schedule DM ophtho exam, send record      Relevant Orders   POCT HgB A1C (Completed)   COMPLETE METABOLIC PANEL WITH GFR   CBC with Differential/Platelet   Ambulatory referral to Podiatry    Other Visit Diagnoses    Memory loss       Relevant Orders   COMPLETE METABOLIC PANEL WITH GFR   TSH   T4, free   Vitamin B12   VITAMIN D 25 Hydroxy (Vit-D Deficiency, Fractures)  Low TSH level       Relevant Orders   TSH   T4, free   Pruritic rash       Relevant Orders   Ambulatory referral to Dermatology   Petechial rash       Relevant Orders   Ambulatory referral to Dermatology   Plantar wart, right foot       Relevant Orders   Ambulatory referral to Dermatology   Chronic toe pain, left foot       Relevant Orders   Ambulatory referral to Podiatry      #Memory Loss Clinically reassuring and not consistent with organic memory loss or cognitive decline Most likely related to emotional / stressors, mental health related Will check labs upcoming for vitamins, thyroid and rest of panel Future consider Neurologist if indicated.  #Podiatry chronic foot pain with diabetic neuropathy, also prior Left foot middle toe injury from fall, had negative x-ray but still has persistent pain. Needs new podiatry due to medicaid insurance change   #Dermatology switch dermatologist due to needs in network with medicaid, plantar warts on feet, petechial rash lower extremity, also history of pruritic rash   Orders Placed This Encounter  Procedures  . COMPLETE METABOLIC PANEL WITH GFR    Standing Status:   Future    Standing Expiration Date:   01/18/2021  . CBC with Differential/Platelet    Standing Status:   Future    Standing Expiration Date:   01/18/2021  . TSH    Standing Status:   Future    Standing Expiration Date:   01/18/2021  . T4, free    Standing Status:   Future    Standing Expiration Date:   01/18/2021  . Vitamin B12    Standing Status:   Future     Standing Expiration Date:   01/18/2021  . VITAMIN D 25 Hydroxy (Vit-D Deficiency, Fractures)    Standing Status:   Future    Standing Expiration Date:   01/18/2021  . Ambulatory referral to Podiatry    Referral Priority:   Routine    Referral Type:   Consultation    Referral Reason:   Specialty Services Required    Requested Specialty:   Podiatry    Number of Visits Requested:   1  . Ambulatory referral to Dermatology    Referral Priority:   Routine    Referral Type:   Consultation    Referral Reason:   Specialty Services Required    Requested Specialty:   Dermatology    Number of Visits Requested:   1  . POCT HgB A1C      No orders of the defined types were placed in this encounter.    Follow up plan: Return in about 4 weeks (around 09/21/2020) for 4-6 weeks fasting lab only then within 1 week later Virtual Telephone Thyroid / Memory loss results.  Future labs ordered for 11/16/20   Saralyn Pilar, DO [facility] 08/24/2020, 11:32 AM

## 2020-08-24 NOTE — Patient Instructions (Addendum)
Thank you for coming to the office today.  Upcoming lab tests stay tuned thyroid and vitamin testing for memory loss  Remain off Victoza  Recent Labs    08/24/20 1149  HGBA1C 5.3   Referrals to [facility] and [facility] D[facility] - stay tuned for apt.   Your provider would like to you have your annual eye exam. Please contact your current eye doctor or here are some good options for you to contact.   [facility]   Address: [address] [place], [place] [address] Phone: [phone]  Website: [email or web address]   [facility] [address], [place], [place] [address] Phone: [phone] [email or web address]  [facility]  Address: [address] [place], [place], [place] [address] Phone: [phone]   [facility] [address] [place], [place] [address] Phone: [phone]  [facility] Address: [address] [place], [place], [place] [address]  Phone: [phone]   Please schedule a Follow-up Appointment to: Return in about 3 months (around 11/24/2020) for 4-6 weeks fasting lab only then within 1 week later Virtual Telephone Thyroid / Memory loss results.  If you have any other questions or concerns, please feel free to call the office or send a message through MyChart. You may also schedule an earlier appointment if necessary.  Additionally, you may be receiving a survey about your experience at our office within a few days to 1 week by e-mail or mail. We value your feedback.  Saralyn Pilar, DO [facility], [place]

## 2020-08-25 NOTE — Assessment & Plan Note (Signed)
Well-controlled DM with A1c 5.3 today, now off GLP1 due to side effect, was on Victoza Complications - peripheral neuropathy, hyperlipidemia, obesity -  increases risk of future cardiovascular complications   Plan:  1. Remain off therapy currently 2. Encourage improved lifestyle - low carb, low sugar diet, reduce portion size, continue improving regular exercise 3. Check CBG , bring log to next visit for review 4. Continue ACEi 5. DM Foot exam done today / Advised to schedule DM ophtho exam, send record

## 2020-08-25 NOTE — Assessment & Plan Note (Signed)
Abnormal weight gain, despite GLP1 therapy, intolerance on this now off

## 2020-08-25 NOTE — Assessment & Plan Note (Signed)
Overdue for lipids Prior mild low HDL, normal LDL Not on statin therapy Return in future for lipids

## 2020-08-31 ENCOUNTER — Other Ambulatory Visit: Payer: Medicaid Other

## 2020-09-13 NOTE — Progress Notes (Signed)
This encounter was created in error - please disregard.

## 2020-09-20 ENCOUNTER — Other Ambulatory Visit: Payer: Self-pay | Admitting: *Deleted

## 2020-09-20 DIAGNOSIS — R413 Other amnesia: Secondary | ICD-10-CM

## 2020-09-20 DIAGNOSIS — Z419 Encounter for procedure for purposes other than remedying health state, unspecified: Secondary | ICD-10-CM | POA: Diagnosis not present

## 2020-09-20 DIAGNOSIS — E1169 Type 2 diabetes mellitus with other specified complication: Secondary | ICD-10-CM

## 2020-09-20 DIAGNOSIS — R7989 Other specified abnormal findings of blood chemistry: Secondary | ICD-10-CM

## 2020-09-20 DIAGNOSIS — E1142 Type 2 diabetes mellitus with diabetic polyneuropathy: Secondary | ICD-10-CM

## 2020-09-21 ENCOUNTER — Other Ambulatory Visit: Payer: Medicaid Other

## 2020-09-21 ENCOUNTER — Other Ambulatory Visit: Payer: Self-pay

## 2020-09-21 DIAGNOSIS — I10 Essential (primary) hypertension: Secondary | ICD-10-CM

## 2020-09-21 DIAGNOSIS — R600 Localized edema: Secondary | ICD-10-CM

## 2020-09-23 MED ORDER — LISINOPRIL 5 MG PO TABS: 5.0000 mg | ORAL_TABLET | Freq: Every day | ORAL | 1 refills | Status: DC

## 2020-09-23 MED ORDER — FUROSEMIDE 20 MG PO TABS: 10.0000 mg | ORAL_TABLET | Freq: Every day | ORAL | 0 refills | Status: DC | PRN

## 2020-09-28 ENCOUNTER — Telehealth: Payer: Medicaid Other | Admitting: Family Medicine

## 2020-10-20 DIAGNOSIS — Z419 Encounter for procedure for purposes other than remedying health state, unspecified: Secondary | ICD-10-CM | POA: Diagnosis not present

## 2020-11-16 ENCOUNTER — Other Ambulatory Visit: Payer: Medicaid Other

## 2020-11-20 DIAGNOSIS — Z419 Encounter for procedure for purposes other than remedying health state, unspecified: Secondary | ICD-10-CM | POA: Diagnosis not present

## 2020-11-23 ENCOUNTER — Telehealth: Payer: Medicaid Other | Admitting: Family Medicine

## 2020-12-21 DIAGNOSIS — Z419 Encounter for procedure for purposes other than remedying health state, unspecified: Secondary | ICD-10-CM | POA: Diagnosis not present

## 2021-01-18 DIAGNOSIS — Z419 Encounter for procedure for purposes other than remedying health state, unspecified: Secondary | ICD-10-CM | POA: Diagnosis not present

## 2021-02-16 ENCOUNTER — Ambulatory Visit: Payer: Medicaid Other | Admitting: Family Medicine

## 2021-02-18 DIAGNOSIS — Z419 Encounter for procedure for purposes other than remedying health state, unspecified: Secondary | ICD-10-CM | POA: Diagnosis not present

## 2021-02-21 ENCOUNTER — Ambulatory Visit: Payer: Medicaid Other | Admitting: Family Medicine

## 2021-02-23 ENCOUNTER — Encounter: Payer: Self-pay | Admitting: Family Medicine

## 2021-02-23 ENCOUNTER — Other Ambulatory Visit: Payer: Self-pay

## 2021-02-23 ENCOUNTER — Ambulatory Visit (INDEPENDENT_AMBULATORY_CARE_PROVIDER_SITE_OTHER): Payer: Medicaid Other | Admitting: Family Medicine

## 2021-02-23 VITALS — BP 130/70 | HR 83 | Ht 61.0 in | Wt 213.6 lb

## 2021-02-23 DIAGNOSIS — I10 Essential (primary) hypertension: Secondary | ICD-10-CM | POA: Diagnosis not present

## 2021-02-23 DIAGNOSIS — G8929 Other chronic pain: Secondary | ICD-10-CM

## 2021-02-23 DIAGNOSIS — N951 Menopausal and female climacteric states: Secondary | ICD-10-CM

## 2021-02-23 DIAGNOSIS — R7989 Other specified abnormal findings of blood chemistry: Secondary | ICD-10-CM

## 2021-02-23 DIAGNOSIS — R600 Localized edema: Secondary | ICD-10-CM

## 2021-02-23 DIAGNOSIS — E559 Vitamin D deficiency, unspecified: Secondary | ICD-10-CM | POA: Diagnosis not present

## 2021-02-23 DIAGNOSIS — E1142 Type 2 diabetes mellitus with diabetic polyneuropathy: Secondary | ICD-10-CM

## 2021-02-23 DIAGNOSIS — L689 Hypertrichosis, unspecified: Secondary | ICD-10-CM | POA: Diagnosis not present

## 2021-02-23 DIAGNOSIS — E538 Deficiency of other specified B group vitamins: Secondary | ICD-10-CM | POA: Diagnosis not present

## 2021-02-23 DIAGNOSIS — M79675 Pain in left toe(s): Secondary | ICD-10-CM | POA: Diagnosis not present

## 2021-02-23 LAB — POCT GLYCOSYLATED HEMOGLOBIN (HGB A1C): Hemoglobin A1C: 6 % — AB (ref 4.0–5.6)

## 2021-02-23 MED ORDER — LISINOPRIL 5 MG PO TABS: 5.0000 mg | ORAL_TABLET | Freq: Every day | ORAL | 1 refills | Status: DC

## 2021-02-23 MED ORDER — FUROSEMIDE 20 MG PO TABS: 10.0000 mg | ORAL_TABLET | Freq: Every day | ORAL | 1 refills | Status: DC | PRN

## 2021-02-23 NOTE — Progress Notes (Signed)
Subjective:    Patient ID: Carol Collins, female    DOB: 05-Sep-1976, 45 y.o.   MRN: [id number]  Carol Collins is a 45 y.o. female presenting on 02/23/2021 for Diabetes   HPI   CHRONIC DM, Type 2 / DM Neuropathy/ Morbid Obesity BMI >40 Previously A1c trend 5-6 range. Improved on Bydureon in past. Did not do well with Victoza, side effects Today due for A1c CBGs: Not checking regularly Meds: OFF Victoza - OFF Metformin 1000mg  BIDdue to GI intolerance Reports good compliance. Tolerating well Currently on ACEi Denies hypoglycemia  Worn out fatigued, hungry, sugar cravings She feels like she has hyperglycemia Increased sleeping. No problem falling asleep or staying asleep. Has had partial hysterectomy since then Mother had similar history with symptoms Admits facial hair growth   Chronic L Toe Pain. Podiatry - Known diabetic neuropathy see above, also has L middle toe pain from injury back in 03/2020, had x-ray at that time Dr 04/2020, negative. Still pain asking for additional help. Needs new referral to in network apt  [facility]  Elevated BP reading No longer on Lisinopril low dose. It was for renal protection not for HTN.    Depression screen [facility] 2/9 08/25/2020 04/22/2019 12/12/2018  Decreased Interest 0 0 0  Down, Depressed, Hopeless 0 0 0  PHQ - 2 Score 0 0 0    Social History   Tobacco Use  . Smoking status: Current Some Day Smoker    Packs/day: 0.25    Years: 17.00    Pack years: 4.25    Types: Cigarettes  . Smokeless tobacco: Current User  Vaping Use  . Vaping Use: Some days  . Substances: THC  Substance Use Topics  . Alcohol use: No  . Drug use: Yes    Types: Marijuana    Comment: Regular use marijuana now for pain    Review of Systems Per HPI unless specifically indicated above     Objective:    BP 130/70 (BP Location: Left Arm, Cuff Size: Normal)   Pulse 83   Ht 5\' 1"  (1.549 m)   Wt 213 lb 9.6 oz (96.9 kg)   LMP 11/20/2013 Comment:  hysterectomy  SpO2 99%   BMI 40.36 kg/m   Wt Readings from Last 3 Encounters:  02/23/21 213 lb 9.6 oz (96.9 kg)  08/24/20 204 lb 9.6 oz (92.8 kg)  02/02/20 183 lb (83 kg)    Physical Exam Vitals and nursing note reviewed.  Constitutional:      General: She is not in acute distress.    Appearance: She is well-developed. She is obese. She is not diaphoretic.     Comments: Well-appearing, comfortable, cooperative  HENT:     Head: Normocephalic and atraumatic.  Eyes:     General:        Right eye: No discharge.        Left eye: No discharge.     Conjunctiva/sclera: Conjunctivae normal.  Cardiovascular:     Rate and Rhythm: Normal rate.  Pulmonary:     Effort: Pulmonary effort is normal.  Skin:    General: Skin is warm and dry.     Findings: No erythema or rash.  Neurological:     Mental Status: She is alert and oriented to person, place, and time.  Psychiatric:        Behavior: Behavior normal.     Comments: Well groomed, good eye contact, normal speech and thoughts     Recent Labs  08/24/20 1149 02/23/21 1019  HGBA1C 5.3 6.0*     Results for orders placed or performed in visit on 02/23/21  POCT HgB A1C  Result Value Ref Range   Hemoglobin A1C 6.0 (A) 4.0 - 5.6 %      Assessment & Plan:   Problem List Items Addressed This Visit    Pedal edema   Relevant Medications   furosemide (LASIX) 20 MG tablet   Morbid obesity (HCC)   Relevant Orders   COMPLETE METABOLIC PANEL WITH GFR   Testosterone   FSH   DM type 2 with diabetic peripheral neuropathy (HCC) - Primary   Relevant Medications   lisinopril (ZESTRIL) 5 MG tablet   Other Relevant Orders   POCT HgB A1C (Completed)   CBC with Differential/Platelet   COMPLETE METABOLIC PANEL WITH GFR    Other Visit Diagnoses    Low TSH level       Relevant Orders   TSH   T4, free   Vitamin B12 deficiency       Relevant Orders   Vitamin B12   Vitamin D deficiency       Relevant Orders   VITAMIN D 25 Hydroxy  (Vit-D Deficiency, Fractures)   Essential hypertension       Relevant Medications   lisinopril (ZESTRIL) 5 MG tablet   furosemide (LASIX) 20 MG tablet   Excessive hair growth       Relevant Orders   Testosterone   FSH   Perimenopausal symptoms       Relevant Orders   FSH   Chronic toe pain, left foot       Relevant Orders   Ambulatory referral to Podiatry      Clinically with constellation of symptoms with fatigue Uncertain DM control off medication GLP1, previously low in 5 range for A1c on GLP1, now off medication due to various side effects, seems that she feels still has uncontrolled sugar despite A1c 6.0%, no daily CBG readings, asked to start checking to log more detail on day to day sugar fluctuation, she could possibly have higher range of sugar that avg out to 6.0% still.  Overdue for other labs chemistry, CBC - rule out anemia. Also hormonal component with Testosterone rule out PCOS and check [facility] now age 65 perimenopausal issue  Anticipate may refer to GYN if indicated.  Referral to [facility] Podiatry for L chronic toe pain.  Orders Placed This Encounter  Procedures  . CBC with Differential/Platelet  . COMPLETE METABOLIC PANEL WITH GFR  . Vitamin B12  . VITAMIN D 25 Hydroxy (Vit-D Deficiency, Fractures)  . TSH  . T4, free  . Testosterone  . FSH  . Ambulatory referral to Podiatry    Referral Priority:   Routine    Referral Type:   Consultation    Referral Reason:   Specialty Services Required    Requested Specialty:   Podiatry    Number of Visits Requested:   1  . POCT HgB A1C     Meds ordered this encounter  Medications  . lisinopril (ZESTRIL) 5 MG tablet    Sig: Take 1 tablet (5 mg total) by mouth daily.    Dispense:  90 tablet    Refill:  1  . furosemide (LASIX) 20 MG tablet    Sig: Take 0.5-1 tablets (10-20 mg total) by mouth daily as needed for fluid or edema.    Dispense:  90 tablet    Refill:  1      Follow  up plan: Return in about 6 months  (around 08/25/2021) for 6 month follow-up DM A1c.   Saralyn Pilar, DO [facility] 02/23/2021, 10:15 AM

## 2021-02-23 NOTE — Patient Instructions (Addendum)
Thank you for coming to the office today.  Labs today  Refilled Lisinopril and Fluid pill.  Check blood sugars different times of day, can do fasting AM and 2 hour after meal or before bed.  If sugars are erratic or high and low the avg can still be around 6%  Recent Labs    08/24/20 1149 02/23/21 1019  HGBA1C 5.3 6.0*   To be determined, if not sugar then this can be hormonal related as well.    Please schedule a Follow-up Appointment to: Return in about 6 months (around 08/25/2021) for 6 month follow-up DM A1c.  If you have any other questions or concerns, please feel free to call the office or send a message through MyChart. You may also schedule an earlier appointment if necessary.  Additionally, you may be receiving a survey about your experience at our office within a few days to 1 week by e-mail or mail. We value your feedback.  Saralyn Pilar, DO [facility], [place]

## 2021-02-25 LAB — COMPLETE METABOLIC PANEL WITH GFR
AG Ratio: 1.9 (calc) (ref 1.0–2.5)
ALT: 10 U/L (ref 6–29)
AST: 12 U/L (ref 10–35)
Albumin: 4.3 g/dL (ref 3.6–5.1)
Alkaline phosphatase (APISO): 61 U/L (ref 31–125)
BUN: 14 mg/dL (ref 7–25)
CO2: 27 mmol/L (ref 20–32)
Calcium: 9.1 mg/dL (ref 8.6–10.2)
Chloride: 104 mmol/L (ref 98–110)
Creat: 0.85 mg/dL (ref 0.50–1.10)
GFR, Est African American: 95 mL/min/{1.73_m2} (ref >=60)
GFR, Est Non African American: 82 mL/min/{1.73_m2} (ref >=60)
Globulin: 2.3 g/dL (calc) (ref 1.9–3.7)
Glucose, Bld: 112 mg/dL — ABNORMAL HIGH (ref 65–99)
Potassium: 4.4 mmol/L (ref 3.5–5.3)
Sodium: 138 mmol/L (ref 135–146)
Total Bilirubin: 0.8 mg/dL (ref 0.2–1.2)
Total Protein: 6.6 g/dL (ref 6.1–8.1)

## 2021-02-25 LAB — CBC WITH DIFFERENTIAL/PLATELET
Absolute Monocytes: 434 cells/uL (ref 200–950)
Basophils Absolute: 43 cells/uL (ref 0–200)
Basophils Relative: 0.5 %
Eosinophils Absolute: 119 cells/uL (ref 15–500)
Eosinophils Relative: 1.4 %
HCT: 41.9 % (ref 35.0–45.0)
Hemoglobin: 14.5 g/dL (ref 11.7–15.5)
Lymphs Abs: 1675 cells/uL (ref 850–3900)
MCH: 32.8 pg (ref 27.0–33.0)
MCHC: 34.6 g/dL (ref 32.0–36.0)
MCV: 94.8 fL (ref 80.0–100.0)
MPV: 10.5 fL (ref 7.5–12.5)
Monocytes Relative: 5.1 %
Neutro Abs: 6231 cells/uL (ref 1500–7800)
Neutrophils Relative %: 73.3 %
Platelets: 163 10*3/uL (ref 140–400)
RBC: 4.42 10*6/uL (ref 3.80–5.10)
RDW: 13.3 % (ref 11.0–15.0)
Total Lymphocyte: 19.7 %
WBC: 8.5 10*3/uL (ref 3.8–10.8)

## 2021-02-25 LAB — TESTOSTERONE, TOTAL, LC/MS/MS: Testosterone, Total, LC-MS-MS: 38 ng/dL (ref 2–45)

## 2021-02-25 LAB — VITAMIN D 25 HYDROXY (VIT D DEFICIENCY, FRACTURES): Vit D, 25-Hydroxy: 12 ng/mL — ABNORMAL LOW (ref 30–100)

## 2021-02-25 LAB — T4, FREE: Free T4: 1.2 ng/dL (ref 0.8–1.8)

## 2021-02-25 LAB — VITAMIN B12: Vitamin B-12: 261 pg/mL (ref 200–1100)

## 2021-02-25 LAB — TSH: TSH: 0.46 mIU/L

## 2021-02-25 LAB — FOLLICLE STIMULATING HORMONE: FSH: 5.7 m[IU]/mL

## 2021-03-17 DIAGNOSIS — M79672 Pain in left foot: Secondary | ICD-10-CM | POA: Diagnosis not present

## 2021-03-17 DIAGNOSIS — E1142 Type 2 diabetes mellitus with diabetic polyneuropathy: Secondary | ICD-10-CM | POA: Diagnosis not present

## 2021-03-17 DIAGNOSIS — M79675 Pain in left toe(s): Secondary | ICD-10-CM | POA: Diagnosis not present

## 2021-03-17 DIAGNOSIS — L851 Acquired keratosis [keratoderma] palmaris et plantaris: Secondary | ICD-10-CM | POA: Diagnosis not present

## 2021-03-17 DIAGNOSIS — Q828 Other specified congenital malformations of skin: Secondary | ICD-10-CM | POA: Diagnosis not present

## 2021-03-20 DIAGNOSIS — Z419 Encounter for procedure for purposes other than remedying health state, unspecified: Secondary | ICD-10-CM | POA: Diagnosis not present

## 2021-03-30 DIAGNOSIS — H5213 Myopia, bilateral: Secondary | ICD-10-CM | POA: Diagnosis not present

## 2021-03-31 ENCOUNTER — Other Ambulatory Visit: Payer: Self-pay

## 2021-03-31 ENCOUNTER — Ambulatory Visit: Payer: Self-pay | Admitting: *Deleted

## 2021-03-31 ENCOUNTER — Ambulatory Visit
Admission: RE | Admit: 2021-03-31 | Discharge: 2021-03-31 | Disposition: A | Discharge status: Home / Self Care | Payer: Medicaid Other | Source: Ambulatory Visit | Attending: Emergency Medicine | Admitting: Emergency Medicine

## 2021-03-31 ENCOUNTER — Ambulatory Visit: Payer: Self-pay

## 2021-03-31 VITALS — BP 157/93 | HR 76 | Temp 98.4°F | Resp 18 | Ht 61.0 in | Wt 215.0 lb

## 2021-03-31 DIAGNOSIS — N611 Abscess of the breast and nipple: Secondary | ICD-10-CM | POA: Diagnosis not present

## 2021-03-31 LAB — GLUCOSE, CAPILLARY: Glucose-Capillary: 126 mg/dL — ABNORMAL HIGH (ref 70–99)

## 2021-03-31 MED ORDER — IBUPROFEN 600 MG PO TABS: 600.0000 mg | ORAL_TABLET | Freq: Four times a day (QID) | ORAL | 0 refills | Status: DC | PRN

## 2021-03-31 MED ORDER — DOXYCYCLINE HYCLATE 100 MG PO CAPS: 100.0000 mg | ORAL_CAPSULE | Freq: Two times a day (BID) | ORAL | 0 refills | Status: AC

## 2021-03-31 MED ORDER — CEFTRIAXONE SODIUM 1 G IJ SOLR
1.0000 g | Freq: Once | INTRAMUSCULAR | Status: AC
  Administered 2021-03-31: 1 g via INTRAMUSCULAR

## 2021-03-31 NOTE — Discharge Instructions (Addendum)
Warm compresses, 600 mg of ibuprofen combined with 1000 mg of Tylenol together 3-4 times a day as needed for pain.  Please call Dr. Geoffery Lyons office first thing tomorrow at [phone] and notify them that I have discussed the case with him and that he has approved for you to be seen by him or one of his colleagues in the morning.

## 2021-03-31 NOTE — ED Provider Notes (Signed)
HPI  SUBJECTIVE:  Carol Collins is a 45 y.o. female who presents with constantly painful area of erythema, tenderness that is getting bigger on her left breast.  She describes the pain as constant, dull but becomes sharp with palpation.  It started as tenderness 4 days ago, and then as a "pimple" 3 days ago.  She denies body aches, fevers, drainage, trauma, known insect bite although she has been working outside.  No antipyretic in the past 6 hours.  She tried peroxide without improvement in her symptoms.  States the black center appeared after using the peroxide.  No alleviating factors.  Symptoms worse with palpation.  She has a past medical history of abscesses, but no history of MRSA.  She has history of diabetes, is currently not on any medications per PMD.  LMP: She is status post hysterectomy.  PMD: Olin Hauser, DO   Past Medical History:  Diagnosis Date  . Hypertension   . Neuropathy     Past Surgical History:  Procedure Laterality Date  . ABDOMINAL HYSTERECTOMY    . CESAREAN SECTION    . DIAGNOSTIC LAPAROSCOPY    . laproscopy    . MYRINGOTOMY WITH TUBE PLACEMENT Bilateral 08/31/2016   Procedure: MYRINGOTOMY WITH TUBE PLACEMENT;  Surgeon: Carloyn Manner, MD;  Location: [facility] ORS;  Service: ENT;  Laterality: Bilateral;    Family History  Problem Relation Age of Onset  . Heart disease Father   . Diabetes Father   . Hyperlipidemia Father   . Hyperlipidemia Sister   . Diabetes Mother   . Diabetes Maternal Grandfather     Social History   Tobacco Use  . Smoking status: Current Some Day Smoker    Packs/day: 0.25    Years: 17.00    Pack years: 4.25    Types: Cigarettes  . Smokeless tobacco: Current User  Vaping Use  . Vaping Use: Some days  . Substances: THC  Substance Use Topics  . Alcohol use: No  . Drug use: Yes    Types: Marijuana    Comment: Regular use marijuana now for pain    No current facility-administered medications for this  encounter.  Current Outpatient Medications:  .  doxycycline (VIBRAMYCIN) 100 MG capsule, Take 1 capsule (100 mg total) by mouth 2 (two) times daily for 5 days., Disp: 10 capsule, Rfl: 0 .  furosemide (LASIX) 20 MG tablet, Take 0.5-1 tablets (10-20 mg total) by mouth daily as needed for fluid or edema., Disp: 90 tablet, Rfl: 1 .  ibuprofen (ADVIL) 600 MG tablet, Take 1 tablet (600 mg total) by mouth every 6 (six) hours as needed., Disp: 30 tablet, Rfl: 0 .  lisinopril (ZESTRIL) 5 MG tablet, Take 1 tablet (5 mg total) by mouth daily., Disp: 90 tablet, Rfl: 1 .  Accu-Chek FastClix Lancets MISC, Use to check blood sugar up to 2 times daily (Patient not taking: Reported on 02/23/2021), Disp: 204 each, Rfl: 3 .  ACCU-CHEK GUIDE test strip, Use to check blood sugar up to 2 times daily (Patient not taking: Reported on 02/23/2021), Disp: 200 each, Rfl: 3 .  Blood Glucose Monitoring Suppl (ACCU-CHEK GUIDE) w/Device KIT, 1 kit by Does not apply route See admin instructions. Use to check blood sugar up to 2 times daily (Patient not taking: Reported on 02/23/2021), Disp: 1 kit, Rfl: 0  Allergies  Allergen Reactions  . Codeine Other (See Comments)    "hyperactivity"  . Provera [Medroxyprogesterone Acetate]     Causes myalgias and decreased  libido  . Latex Rash     ROS  As noted in HPI.   Physical Exam  BP (!) 157/93 (BP Location: Left Arm)   Pulse 76   Temp 98.4 F (36.9 C) (Oral)   Resp 18   Ht _0  (1.549 m)   Wt 97.5 kg   LMP 11/20/2013 Comment: hysterectomy  SpO2 100%   BMI 40.62 kg/m   Constitutional: Well developed, well nourished, no acute distress Eyes:  EOMI, conjunctiva normal bilaterally HENT: Normocephalic, atraumatic,mucus membranes moist Respiratory: Normal inspiratory effort Cardiovascular: Normal rate GI: nondistended skin: 5.5 x 7 cm tender area of erythema, induration with central fluctuance and necrotic center versus scab right lower quadrant left breast.  Marked area of  erythema with a marker for reference.  No expressible drainage.  No expressible nipple drainage.    Lymph: No axillary lymphadenopathy Musculoskeletal: no deformities Neurologic: Alert & oriented x 3, no focal neuro deficits Psychiatric: Speech and behavior appropriate   ED Course   Medications  cefTRIAXone (ROCEPHIN) injection 1 g (1 g Intramuscular Given 03/31/21 1948)    Orders Placed This Encounter  Procedures  . Glucose, capillary    Standing Status:   Standing    Number of Occurrences:   1  . CBG monitoring, ED    Standing Status:   Standing    Number of Occurrences:   1    Results for orders placed or performed during the hospital encounter of 03/31/21 (from the past 24 hour(s))  Glucose, capillary     Status: Abnormal   Collection Time: 03/31/21  7:46 PM  Result Value Ref Range   Glucose-Capillary 126 (H) 70 - 99 mg/dL   No results found.  ED Clinical Impression  1. Breast abscess      ED Assessment/Plan  Will check fingerstick as patient is currently not on any medication-was discontinued by PMD.  However patient states that she does not feel well and has gained a lot of weight back.  Random fingerstick 126.  She has a left breast abscess.  Will give her 1 g of Rocephin.  Discussed with Dr. Lysle Pearl, surgery on-call.  He will see her tomorrow.  She is to call his office tomorrow [phone] and notify them that he approved for her to be seen tomorrow morning.  She will go to the ER if she gets worse in the interim.  Discussed  MDM, treatment plan, and plan for follow-up with patient. Discussed sn/sx that should prompt return to the ED. patient agrees with plan.   Meds ordered this encounter  Medications  . cefTRIAXone (ROCEPHIN) injection 1 g  . ibuprofen (ADVIL) 600 MG tablet    Sig: Take 1 tablet (600 mg total) by mouth every 6 (six) hours as needed.    Dispense:  30 tablet    Refill:  0  . doxycycline (VIBRAMYCIN) 100 MG capsule    Sig: Take 1  capsule (100 mg total) by mouth 2 (two) times daily for 5 days.    Dispense:  10 capsule    Refill:  0      *This clinic note was created using [employer]. Therefore, there may be occasional mistakes despite careful proofreading.  ?    Melynda Ripple, MD 03/31/21 1958

## 2021-03-31 NOTE — Telephone Encounter (Signed)
Patient is calling to report she has breast abscess- L breast. Patient states it appeared 3 days ago- inflamed, painful, large. Call to office- (per Fleet Contras) no appointment available - will advised UC for evaluation.  Reason for Disposition . SEVERE pain (e.g., excruciating)  Answer Assessment - Initial Assessment Questions 1. APPEARANCE of BOIL: "What does the boil look like?"      Red, inflamed, peeling skin, not open 2. LOCATION: "Where is the boil located?"      Under L breast- 7-8 oclock 3. NUMBER: "How many boils are there?"      1 4. SIZE: "How big is the boil?" (e.g., inches, cm; compare to size of a coin or other object)     Center quarter- area- 1/4-1/3 of breast tissue 5. ONSET: "When did the boil start?"     3 days 6. PAIN: "Is there any pain?" If Yes, ask: "How bad is the pain?"   (Scale 1-10; or mild, moderate, severe)     Yes- severe 7. FEVER: "Do you have a fever?" If Yes, ask: "What is it, how was it measured, and when did it start?"      no 8. SOURCE: "Have you been around anyone with boils or other Staph infections?" "Have you ever had boils before?"     Yes-stomach 9. OTHER SYMPTOMS: "Do you have any other symptoms?" (e.g., shaking chills, weakness, rash elsewhere on body)     no 10. PREGNANCY: "Is there any chance you are pregnant?" "When was your last menstrual period?"       n/a  Protocols used: BOIL (SKIN ABSCESS)-A-AH

## 2021-03-31 NOTE — ED Triage Notes (Signed)
Patient has an abscess on her left breast. Patient states that this started around 4 days. Has a black center with erythema around it.

## 2021-04-01 DIAGNOSIS — N611 Abscess of the breast and nipple: Secondary | ICD-10-CM | POA: Diagnosis not present

## 2021-04-20 DIAGNOSIS — Z419 Encounter for procedure for purposes other than remedying health state, unspecified: Secondary | ICD-10-CM | POA: Diagnosis not present

## 2021-05-24 ENCOUNTER — Ambulatory Visit: Payer: Medicaid Other | Admitting: Internal Medicine

## 2021-05-30 ENCOUNTER — Encounter: Payer: Self-pay | Admitting: Family Medicine

## 2021-05-30 ENCOUNTER — Other Ambulatory Visit: Payer: Self-pay

## 2021-05-30 ENCOUNTER — Ambulatory Visit (INDEPENDENT_AMBULATORY_CARE_PROVIDER_SITE_OTHER): Payer: Medicaid Other | Admitting: Family Medicine

## 2021-05-30 VITALS — BP 138/88 | HR 68 | Temp 98.5°F | Ht 61.0 in | Wt 219.0 lb

## 2021-05-30 DIAGNOSIS — E1142 Type 2 diabetes mellitus with diabetic polyneuropathy: Secondary | ICD-10-CM | POA: Diagnosis not present

## 2021-05-30 DIAGNOSIS — G4710 Hypersomnia, unspecified: Secondary | ICD-10-CM | POA: Insufficient documentation

## 2021-05-30 DIAGNOSIS — I10 Essential (primary) hypertension: Secondary | ICD-10-CM | POA: Insufficient documentation

## 2021-05-30 DIAGNOSIS — J309 Allergic rhinitis, unspecified: Secondary | ICD-10-CM

## 2021-05-30 DIAGNOSIS — G4720 Circadian rhythm sleep disorder, unspecified type: Secondary | ICD-10-CM | POA: Diagnosis not present

## 2021-05-30 DIAGNOSIS — R7989 Other specified abnormal findings of blood chemistry: Secondary | ICD-10-CM | POA: Diagnosis not present

## 2021-05-30 LAB — POCT GLYCOSYLATED HEMOGLOBIN (HGB A1C): Hemoglobin A1C: 6.6 % — AB (ref 4.0–5.6)

## 2021-05-30 MED ORDER — METFORMIN HCL ER 500 MG PO TB24: 500.0000 mg | ORAL_TABLET | Freq: Every day | ORAL | 11 refills | Status: DC

## 2021-05-30 MED ORDER — LISINOPRIL 5 MG PO TABS: 5.0000 mg | ORAL_TABLET | Freq: Every day | ORAL | 1 refills | Status: DC

## 2021-05-30 MED ORDER — VITAMIN D (ERGOCALCIFEROL) 1.25 MG (50000 UNIT) PO CAPS: 50000.0000 [IU] | ORAL_CAPSULE | ORAL | 0 refills | Status: DC

## 2021-05-30 MED ORDER — CETIRIZINE HCL 10 MG PO TABS: 10.0000 mg | ORAL_TABLET | Freq: Every day | ORAL | 11 refills | Status: DC

## 2021-05-30 MED ORDER — FLUTICASONE PROPIONATE 50 MCG/ACT NA SUSP: 2.0000 | Freq: Every day | NASAL | 3 refills | Status: DC

## 2021-05-30 NOTE — Patient Instructions (Addendum)
-   Dose metformin 500 mg daily with food x2 weeks - After 2 weeks, dose metformin 500 mg twice daily - Restart lisinopril and dose this daily - Use Flonase and cetirizine (Zyrtec) for allergy symptoms - Dose weekly vitamin D until Rx complete - Referral coordinator will contact you in regards to ENT/sleep medicine follow-up - Return for follow-up in our office in 2 weeks for reevaluation - Contact for questions between now and then

## 2021-05-30 NOTE — Assessment & Plan Note (Signed)
Is a chronic stable condition, medications refilled today.

## 2021-05-30 NOTE — Assessment & Plan Note (Signed)
Recheck of point-of-care A1c today shows interval increase, as such we discussed various lifestyle management and pharmacotherapy options.  She is amenable to a restart of metformin 500 mg XR with titration over 2 weeks to 1000 mg daily.  She was advised to monitor for GI disturbance as she is experience this in the past.

## 2021-05-30 NOTE — Assessment & Plan Note (Addendum)
Cardiopulmonary physical exam findings are benign today.  A BP recheck shows adequate range, patient understands need for ACE therapy given diabetes mellitus type 2.  This was prescribed to the patient today.

## 2021-05-30 NOTE — Assessment & Plan Note (Signed)
Recent serum studies show low vitamin D, Rx weekly supplementation x8 weeks prescribed for patient, questions answered.

## 2021-05-30 NOTE — Assessment & Plan Note (Signed)
Concern for OSA, she is amenable for further evaluation by sleep medicine/ENT, a referral was placed today.

## 2021-06-14 ENCOUNTER — Other Ambulatory Visit: Payer: Self-pay

## 2021-06-14 ENCOUNTER — Encounter: Payer: Self-pay | Admitting: Family Medicine

## 2021-06-14 ENCOUNTER — Ambulatory Visit (INDEPENDENT_AMBULATORY_CARE_PROVIDER_SITE_OTHER): Payer: Medicaid Other | Admitting: Family Medicine

## 2021-06-14 VITALS — BP 134/78 | HR 76 | Temp 98.1°F | Ht 60.0 in | Wt 217.8 lb

## 2021-06-14 DIAGNOSIS — E1142 Type 2 diabetes mellitus with diabetic polyneuropathy: Secondary | ICD-10-CM

## 2021-06-14 DIAGNOSIS — I10 Essential (primary) hypertension: Secondary | ICD-10-CM

## 2021-06-14 MED ORDER — LISINOPRIL 10 MG PO TABS: 10.0000 mg | ORAL_TABLET | Freq: Every day | ORAL | 3 refills | Status: DC

## 2021-06-14 NOTE — Progress Notes (Signed)
Primary Care / Sports Medicine Office Visit  Patient Information:  Patient ID: Carol Collins, female DOB: 12/21/1975 Age: 45 y.o. MRN: [id number]   Carol Collins is a pleasant 45 y.o. 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   Morbid obesity ([place]) 04/22/2019   Hidradenitis suppurativa 05/23/2017   Hyperlipidemia associated with type 2 diabetes mellitus ([place]) 08/15/2016   Dizziness 05/04/2016   Pedal edema 11/09/2015   Iron deficiency anemia 10/08/2015   DM type 2 with diabetic peripheral neuropathy ([place]) 10/08/2015   Hyperesthesia 10/08/2015   Allergic rhinitis 10/08/2015   Allergic rhinitis due to pollen 10/08/2015   Smoker 10/08/2015   Past Medical History:  Diagnosis Date   Diabetes mellitus without complication ([place])    Hypertension    Neuropathy    Outpatient Encounter Medications as of 06/14/2021  Medication Sig   cetirizine (ZYRTEC) 10 MG tablet Take 1 tablet (10 mg total) by mouth daily.   fluticasone (FLONASE) 50 MCG/ACT nasal spray Place 2 sprays into both nostrils daily. Use for 4-6 weeks then stop and use seasonally or as needed.   lisinopril (ZESTRIL) 10 MG tablet Take 1 tablet (10 mg total) by mouth daily.   metFORMIN (GLUCOPHAGE XR) 500 MG 24 hr tablet Take 1 tablet (500 mg total) by mouth daily with breakfast.   Vitamin D, Ergocalciferol, (DRISDOL) 1.25 MG (50000 UNIT) CAPS capsule  Take 1 capsule (50,000 Units total) by mouth every 7 (seven) days. Take for 8 total doses(weeks)   [DISCONTINUED] lisinopril (ZESTRIL) 5 MG tablet Take 1 tablet (5 mg total) by mouth daily.   Accu-Chek FastClix Lancets MISC Use to check blood sugar up to 2 times daily (Patient not taking: No sig reported)   ACCU-CHEK GUIDE test strip Use to check blood sugar up to 2 times daily (Patient not taking: No sig reported)   Blood Glucose Monitoring Suppl (ACCU-CHEK GUIDE) w/Device KIT 1 kit by Does not apply route See admin instructions. Use to check blood sugar up to 2 times daily (Patient not taking: No sig reported)   furosemide (LASIX) 20 MG tablet Take 0.5-1 tablets (10-20 mg total) by mouth daily as needed for fluid or edema. (Patient not taking: No sig reported)   ibuprofen (ADVIL) 600 MG tablet Take 1 tablet (600 mg total) by mouth every 6 (six) hours as needed. (Patient not taking: No sig reported)   No facility-administered encounter medications on file as of 06/14/2021.   Past Surgical History:  Procedure Laterality Date   ABDOMINAL HYSTERECTOMY     CESAREAN SECTION     DIAGNOSTIC LAPAROSCOPY     laproscopy     MYRINGOTOMY WITH TUBE PLACEMENT Bilateral 08/31/2016   Procedure: MYRINGOTOMY WITH TUBE PLACEMENT;  Surgeon: Carloyn Manner, MD;  Location: [facility] ORS;  Service: ENT;  Laterality: Bilateral;    Vitals:   06/14/21 1342 06/14/21  1428  BP: (!) 142/84 134/78  Pulse: 76   Temp: 98.1 F (36.7 C)   SpO2: 98%    Vitals:   06/14/21 1342  Weight: 217 lb 12.8 oz (98.8 kg)  Height: 5' (1.524 m)   Body mass index is 42.54 kg/m.  No results found.   Independent interpretation of notes and tests performed by another provider:   None  Procedures performed:   None  Pertinent History, Exam, Impression, and Recommendations:   Essential hypertension Blood pressure has trended down following lisinopril 5 mg, given concomitant diabetes mellitus type 2, further titration to  lisinopril 10 mg discussed and she is amenable to this today.  Cardiopulmonary examination benign today.  Plan for reevaluation in 6 weeks for further titration as clinically dictated.  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  DM type 2 with diabetic peripheral neuropathy ([place]) Patient has been dosing metformin 500 mg, self discontinued this over the past 3 days due to GI concern.  This was reviewed with patient, she states that her GI symptoms include mild, tolerable bowel looseness, not as severe as she has previously experienced.  She is amenable to continued metformin 500 mg dosing.  I have advised that she can remain on this dose for an additional 2-4 weeks with attempted titration at this checkpoints.  She also brings up bilateral foot neuropathy symptoms, these have been stable.  I did review pharmacotherapy options and she is not amenable to these at this time.  I have encouraged activity increase and she has elected to monitor this issue.  She is to contact us for any medication questions or desired modifications.     Orders & Medications Meds ordered this encounter  Medications   lisinopril (ZESTRIL) 10 MG tablet    Sig: Take 1 tablet (10 mg total) by mouth daily.    Dispense:  30 tablet    Refill:  3   No orders of the defined types were placed in this encounter.    Return in about 6 weeks (around 07/26/2021).     Montel Culver, MD   Primary Care Sports Medicine [place]

## 2021-06-14 NOTE — Assessment & Plan Note (Signed)
Blood pressure has trended down following lisinopril 5 mg, given concomitant diabetes mellitus type 2, further titration to lisinopril 10 mg discussed and she is amenable to this today.  Cardiopulmonary examination benign today.  Plan for reevaluation in 6 weeks for further titration as clinically dictated.  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

## 2021-06-14 NOTE — Patient Instructions (Addendum)
-   Continue 500 mg metformin daily x 2-4 weeks (increase to 1000 mg if GI symptoms allow) - Increase lisinopril to 10 mg daily - Keep follow-up with ENT / Sleep Medicine - Aim for 30 minutes exercise (walking dog) at least 5 days / week - Return in 6 weeks - Contact for questions / concerns / etc

## 2021-06-15 NOTE — Assessment & Plan Note (Addendum)
Patient has been dosing metformin 500 mg, self discontinued this over the past 3 days due to GI concern.  This was reviewed with patient, she states that her GI symptoms include mild, tolerable bowel looseness, not as severe as she has previously experienced.  She is amenable to continued metformin 500 mg dosing.  I have advised that she can remain on this dose for an additional 2-4 weeks with attempted titration at this checkpoints.  She also brings up bilateral foot neuropathy symptoms, these have been stable.  I did review pharmacotherapy options and she is not amenable to these at this time.  I have encouraged activity increase and she has elected to monitor this issue.  She is to contact us for any medication questions or desired modifications.

## 2021-06-20 DIAGNOSIS — Z419 Encounter for procedure for purposes other than remedying health state, unspecified: Secondary | ICD-10-CM | POA: Diagnosis not present

## 2021-07-19 DIAGNOSIS — H6123 Impacted cerumen, bilateral: Secondary | ICD-10-CM | POA: Diagnosis not present

## 2021-07-19 DIAGNOSIS — J301 Allergic rhinitis due to pollen: Secondary | ICD-10-CM | POA: Diagnosis not present

## 2021-07-19 DIAGNOSIS — R0683 Snoring: Secondary | ICD-10-CM | POA: Diagnosis not present

## 2021-07-21 DIAGNOSIS — Z419 Encounter for procedure for purposes other than remedying health state, unspecified: Secondary | ICD-10-CM | POA: Diagnosis not present

## 2021-07-28 ENCOUNTER — Ambulatory Visit: Payer: Medicaid Other | Admitting: Family Medicine

## 2021-08-20 DIAGNOSIS — Z419 Encounter for procedure for purposes other than remedying health state, unspecified: Secondary | ICD-10-CM | POA: Diagnosis not present

## 2021-08-30 ENCOUNTER — Encounter: Payer: Self-pay | Admitting: Family Medicine

## 2021-08-30 ENCOUNTER — Other Ambulatory Visit: Payer: Self-pay

## 2021-08-30 ENCOUNTER — Ambulatory Visit (INDEPENDENT_AMBULATORY_CARE_PROVIDER_SITE_OTHER): Payer: Medicaid Other | Admitting: Family Medicine

## 2021-08-30 VITALS — BP 124/82 | HR 66 | Ht 60.0 in | Wt 218.0 lb

## 2021-08-30 DIAGNOSIS — R7989 Other specified abnormal findings of blood chemistry: Secondary | ICD-10-CM | POA: Diagnosis not present

## 2021-08-30 DIAGNOSIS — G4720 Circadian rhythm sleep disorder, unspecified type: Secondary | ICD-10-CM

## 2021-08-30 DIAGNOSIS — E1142 Type 2 diabetes mellitus with diabetic polyneuropathy: Secondary | ICD-10-CM

## 2021-08-30 DIAGNOSIS — Q845 Enlarged and hypertrophic nails: Secondary | ICD-10-CM

## 2021-08-30 DIAGNOSIS — I10 Essential (primary) hypertension: Secondary | ICD-10-CM

## 2021-08-30 LAB — POCT GLYCOSYLATED HEMOGLOBIN (HGB A1C): Hemoglobin A1C: 6.8 % — AB (ref 4.0–5.6)

## 2021-08-30 MED ORDER — OZEMPIC (0.25 OR 0.5 MG/DOSE) 2 MG/1.5ML ~~LOC~~ SOPN: 0.2500 mg | PEN_INJECTOR | SUBCUTANEOUS | 0 refills | Status: DC

## 2021-08-30 NOTE — Assessment & Plan Note (Addendum)
Serial visits have shown downward trending blood pressure readings, tolerating lisinopril 10 mg daily.  Plan to continue current regimen.

## 2021-08-30 NOTE — Assessment & Plan Note (Addendum)
Most recent A1c reading 6.6 (roughly 3 months prior), recheck today reveals level at 6.8.  She has been compliant with metformin 500 mg twice daily, plan to initiate Ozempic to gain tighter control.

## 2021-08-30 NOTE — Assessment & Plan Note (Signed)
Patient reports seeing ENT/sleep medicine and was advised CPAP, we will seek outside records.

## 2021-08-30 NOTE — Patient Instructions (Signed)
-   Inject 0.25 mg into the skin once a week x4 weeks then 0.5 mg once a week for 2 weeks - follow-up with podiatry - use Pennsaid twice daily to the toe - return for follow-up in 6 weeks

## 2021-08-31 ENCOUNTER — Other Ambulatory Visit: Payer: Self-pay | Admitting: Family Medicine

## 2021-08-31 DIAGNOSIS — R7989 Other specified abnormal findings of blood chemistry: Secondary | ICD-10-CM

## 2021-08-31 LAB — VITAMIN D 25 HYDROXY (VIT D DEFICIENCY, FRACTURES): Vit D, 25-Hydroxy: 21.7 ng/mL — ABNORMAL LOW (ref 30.0–100.0)

## 2021-08-31 MED ORDER — VITAMIN D (ERGOCALCIFEROL) 1.25 MG (50000 UNIT) PO CAPS
50000.0000 [IU] | ORAL_CAPSULE | ORAL | 0 refills | Status: DC
Start: 2021-08-31 — End: 2021-11-02

## 2021-09-05 ENCOUNTER — Encounter: Payer: Self-pay | Admitting: Family Medicine

## 2021-09-05 NOTE — Assessment & Plan Note (Signed)
Given persistent low measurements, recheck vitamin D to confirm appropriate range.

## 2021-09-05 NOTE — Assessment & Plan Note (Signed)
Longstanding bilateral foot toe discoloration in the setting of diabetes mellitus type 2.  Clinical features are consistent with onychomycoses, a referral to podiatry has been placed for diabetic foot exam, nail care, and management of onychomycosis.

## 2021-09-05 NOTE — Progress Notes (Signed)
Primary Care / Sports Medicine Office Visit  Patient Information:  Patient ID: Carol Collins, female DOB: Mar 31, 1976 Age: 45 y.o. MRN: [id number]   Carol Collins is a pleasant 45 y.o. 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   Morbid obesity ([place]) 04/22/2019   Hidradenitis suppurativa 05/23/2017   Hyperlipidemia associated with type 2 diabetes mellitus ([place]) 08/15/2016   Dizziness 05/04/2016   Pedal edema 11/09/2015   Iron deficiency anemia 10/08/2015   DM type 2 with diabetic peripheral neuropathy ([place]) 10/08/2015   Hyperesthesia 10/08/2015   Allergic rhinitis 10/08/2015   Allergic rhinitis due to pollen 10/08/2015   Smoker 10/08/2015   Past Medical History:  Diagnosis Date   Diabetes mellitus without complication ([place])    Neuropathy    Outpatient Encounter Medications as of 08/30/2021  Medication Sig   Accu-Chek FastClix Lancets MISC Use to check blood sugar up to 2 times daily   ACCU-CHEK GUIDE test strip Use to check blood sugar up to 2 times daily   Blood Glucose Monitoring Suppl (ACCU-CHEK GUIDE) w/Device KIT 1 kit by Does not apply route See admin instructions. Use to check blood sugar up to 2 times daily   lisinopril (ZESTRIL) 10 MG tablet Take 1 tablet (10 mg total) by mouth daily.   metFORMIN (GLUCOPHAGE XR) 500 MG 24 hr tablet Take 1 tablet (500 mg total) by mouth daily with breakfast. (Patient taking differently: Take 500 mg by mouth in the morning and at bedtime.)   Semaglutide,0.25 or 0.5MG/DOS, (OZEMPIC, 0.25 OR 0.5 MG/DOSE,) 2 MG/1.5ML SOPN Inject 0.25 mg into the skin once a week. Inject  0.25 mg subq qwk for 4 wk then inject 0.5 mg subq qwk x4 wk, then go to full dose pen qwk   Triamcinolone Acetonide (NASACORT ALLERGY 24HR NA) Place 1 spray into both nostrils daily.   cetirizine (ZYRTEC) 10 MG tablet Take 1 tablet (10 mg total) by mouth daily. (Patient not taking: Reported on 08/30/2021)   fluticasone (FLONASE) 50 MCG/ACT nasal spray Place 2 sprays into both nostrils daily. Use for 4-6 weeks then stop and use seasonally or as needed. (Patient not taking: Reported on 08/30/2021)   furosemide (LASIX) 20 MG tablet Take 0.5-1 tablets (10-20 mg total) by mouth daily as needed for fluid or edema. (Patient not taking: No sig reported)   [DISCONTINUED] ibuprofen (ADVIL) 600 MG tablet Take 1 tablet (600 mg total) by mouth every 6 (six) hours as needed. (Patient not taking: No sig reported)   [DISCONTINUED] Vitamin D, Ergocalciferol, (DRISDOL) 1.25 MG (50000 UNIT) CAPS capsule Take 1 capsule (50,000 Units total) by mouth every 7 (seven) days. Take for 8 total doses(weeks)   No facility-administered encounter medications on file as of 08/30/2021.   Past Surgical History:  Procedure Laterality Date   ABDOMINAL HYSTERECTOMY     CESAREAN SECTION     DIAGNOSTIC LAPAROSCOPY     laproscopy     MYRINGOTOMY WITH TUBE PLACEMENT Bilateral 08/31/2016   Procedure: MYRINGOTOMY WITH TUBE PLACEMENT;  Surgeon: Carloyn Manner, MD;  Location: [facility] ORS;  Service: ENT;  Laterality: Bilateral;  Vitals:   08/30/21 1326  BP: 124/82  Pulse: 66  SpO2: 97%   Vitals:   08/30/21 1326  Weight: 218 lb (98.9 kg)  Height: 5' (1.524 m)   Body mass index is 42.58 kg/m.  No results found.   Independent interpretation of notes and tests performed by another provider:   None  Procedures performed:   None  Pertinent History, Exam, Impression, and Recommendations:   Essential hypertension Serial visits have shown downward trending blood pressure readings, tolerating lisinopril 10 mg daily.  Plan  to continue current regimen.  DM type 2 with diabetic peripheral neuropathy ([facility]) Most recent A1c reading 6.6 (roughly 3 months prior), recheck today reveals level at 6.8.  She has been compliant with metformin 500 mg twice daily, plan to initiate Ozempic to gain tighter control.  Disorder of sleep-wake cycle Patient reports seeing ENT/sleep medicine and was advised CPAP, we will seek outside records.  Enlarged and hypertrophic nails Longstanding bilateral foot toe discoloration in the setting of diabetes mellitus type 2.  Clinical features are consistent with onychomycoses, a referral to podiatry has been placed for diabetic foot exam, nail care, and management of onychomycosis.   Low serum vitamin D Given persistent low measurements, recheck vitamin D to confirm appropriate range.   Orders & Medications Meds ordered this encounter  Medications   Semaglutide,0.25 or 0.5MG/DOS, (OZEMPIC, 0.25 OR 0.5 MG/DOSE,) 2 MG/1.5ML SOPN    Sig: Inject 0.25 mg into the skin once a week. Inject 0.25 mg subq qwk for 4 wk then inject 0.5 mg subq qwk x4 wk, then go to full dose pen qwk    Dispense:  1.5 mL    Refill:  0    Use discount coupon. Superior A1c reduction. Dx Metabolic syndrome (rep [place] [phone])   Orders Placed This Encounter  Procedures   VITAMIN D 25 Hydroxy (Vit-D Deficiency, Fractures)   Ambulatory referral to Podiatry   POCT HgB A1C     No follow-ups on file.     Montel Culver, MD   Primary Care Sports Medicine [place]

## 2021-09-06 NOTE — Telephone Encounter (Signed)
For your information  

## 2021-09-20 DIAGNOSIS — Z419 Encounter for procedure for purposes other than remedying health state, unspecified: Secondary | ICD-10-CM | POA: Diagnosis not present

## 2021-10-19 ENCOUNTER — Encounter: Payer: Self-pay | Admitting: Family Medicine

## 2021-10-19 ENCOUNTER — Other Ambulatory Visit: Payer: Self-pay

## 2021-10-19 DIAGNOSIS — E1142 Type 2 diabetes mellitus with diabetic polyneuropathy: Secondary | ICD-10-CM

## 2021-10-19 MED ORDER — SEMAGLUTIDE (1 MG/DOSE) 4 MG/3ML ~~LOC~~ SOPN: 1.0000 mg | PEN_INJECTOR | SUBCUTANEOUS | 0 refills | Status: DC

## 2021-10-20 DIAGNOSIS — Z419 Encounter for procedure for purposes other than remedying health state, unspecified: Secondary | ICD-10-CM | POA: Diagnosis not present

## 2021-10-25 ENCOUNTER — Ambulatory Visit: Payer: Medicaid Other | Admitting: Family Medicine

## 2021-11-02 ENCOUNTER — Encounter: Payer: Self-pay | Admitting: Family Medicine

## 2021-11-02 ENCOUNTER — Other Ambulatory Visit: Payer: Self-pay

## 2021-11-02 ENCOUNTER — Ambulatory Visit (INDEPENDENT_AMBULATORY_CARE_PROVIDER_SITE_OTHER): Payer: Medicaid Other | Admitting: Family Medicine

## 2021-11-02 VITALS — BP 120/70 | HR 75 | Ht 60.0 in | Wt 209.0 lb

## 2021-11-02 DIAGNOSIS — L719 Rosacea, unspecified: Secondary | ICD-10-CM | POA: Diagnosis not present

## 2021-11-02 DIAGNOSIS — E1142 Type 2 diabetes mellitus with diabetic polyneuropathy: Secondary | ICD-10-CM | POA: Diagnosis not present

## 2021-11-02 DIAGNOSIS — L02214 Cutaneous abscess of groin: Secondary | ICD-10-CM

## 2021-11-02 DIAGNOSIS — I10 Essential (primary) hypertension: Secondary | ICD-10-CM | POA: Diagnosis not present

## 2021-11-02 HISTORY — DX: Cutaneous abscess of groin: L02.214

## 2021-11-02 MED ORDER — CEPHALEXIN 500 MG PO CAPS: 500.0000 mg | ORAL_CAPSULE | Freq: Two times a day (BID) | ORAL | 0 refills | Status: DC

## 2021-11-02 NOTE — Assessment & Plan Note (Signed)
Patient with painful bump at the right groin noted over the past 2 weeks, atraumatic in onset, associated with slight serosanguineous discharge noted on clothing.  Denies any fevers, chills, does cite recent increased life stressors.  Examination reveals 3 open roughly 1 x 1 cm lesions with a faintly erythematous base, indurated in nature without discrete fluid collection at the right para labial region.  Area is tender, no fluid expressed, no inguinal lymphadenopathy bilaterally noted.  I have advised daily topical care in addition to oral Keflex regimen, patient has been previously tested for MRSA and has been negative.  If symptoms persist after antibiotic course, she was advised to contact us for consideration of escalation in therapy versus surgical referral.

## 2021-11-02 NOTE — Assessment & Plan Note (Signed)
Patient has been tolerating Ozempic well with initial GI disturbance, appropriate timing for meals and relation to medication dosing reviewed, despite this she has noted spontaneous improvement.  She has decided to discontinue metformin citing GI intolerance.  We will check her A1c at the next visit, can determine medication adjustments at that time.

## 2021-11-02 NOTE — Assessment & Plan Note (Signed)
Chronic, stable, well-controlled on current regimen. 

## 2021-11-02 NOTE — Progress Notes (Signed)
Primary Care / Sports Medicine Office Visit  Patient Information:  Patient ID: Carol Collins, female DOB: 04/10/1976 Age: 45 y.o. MRN: [id number]   Carol Collins is a pleasant 45 y.o. 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  No results found.   Independent interpretation of notes and tests performed by another provider:   None  Procedures performed:   None  Pertinent History, Exam, Impression, and Recommendations:   Rosacea Chronic issue with ongoing symptomatology, a referral to dermatology was placed at patient's preference.  We will follow this issue peripherally.  Abscess of groin, right Patient with painful bump at the right groin noted over the past 2 weeks, atraumatic in onset, associated with slight serosanguineous discharge noted on clothing.  Denies any fevers, chills, does cite recent increased life  stressors.  Examination reveals 3 open roughly 1 x 1 cm lesions with a faintly erythematous base, indurated in nature without discrete fluid collection at the right para labial region.  Area is tender, no fluid expressed, no inguinal lymphadenopathy bilaterally noted.  I have advised daily topical care in addition to oral Keflex regimen, patient has been previously tested for MRSA and has been negative.  If symptoms persist after antibiotic course, she was advised to contact us for consideration of escalation in therapy versus surgical referral.  Morbid obesity (HCC) Patient tolerating Ozempic well has demonstrated roughly 9 pounds of weight loss since initiation over the past month.  DM type 2 with diabetic peripheral neuropathy (HCC) Patient has been tolerating Ozempic well with initial GI disturbance, appropriate timing for meals and relation to medication dosing reviewed, despite this she has noted spontaneous improvement.  She has decided to discontinue metformin citing GI intolerance.  We will check her A1c at the next visit, can determine medication adjustments at that time.  Essential hypertension Chronic, stable, well controlled on current regimen.   Orders & Medications Meds ordered this encounter  Medications   cephALEXin (KEFLEX) 500 MG capsule    Sig: Take 1 capsule (500 mg total) by mouth 2 (two) times daily.    Dispense:  14 capsule    Refill:  0   Orders Placed This Encounter  Procedures   Ambulatory referral to Dermatology     Return in about 1 month (around 12/03/2021).     Ocie Bob  Ashley Royalty, MD   Primary Care Sports Medicine [facility]

## 2021-11-02 NOTE — Assessment & Plan Note (Signed)
Patient tolerating Ozempic well has demonstrated roughly 9 pounds of weight loss since initiation over the past month.

## 2021-11-02 NOTE — Patient Instructions (Addendum)
-   Take antibiotics for full course - Warm compress / shower with gentle massage, use gauze and neosporin until skin closed - Referral coordinator will contact you for dermatology follow-up - Return in 1 month

## 2021-11-02 NOTE — Assessment & Plan Note (Signed)
Chronic issue with ongoing symptomatology, a referral to dermatology was placed at patient's preference.  We will follow this issue peripherally.

## 2021-11-20 DIAGNOSIS — Z419 Encounter for procedure for purposes other than remedying health state, unspecified: Secondary | ICD-10-CM | POA: Diagnosis not present

## 2021-11-24 ENCOUNTER — Encounter: Payer: Self-pay | Admitting: Family Medicine

## 2021-11-24 DIAGNOSIS — E1142 Type 2 diabetes mellitus with diabetic polyneuropathy: Secondary | ICD-10-CM

## 2021-11-24 MED ORDER — SEMAGLUTIDE (1 MG/DOSE) 4 MG/3ML ~~LOC~~ SOPN: 1.0000 mg | PEN_INJECTOR | SUBCUTANEOUS | 0 refills | Status: DC

## 2021-11-24 NOTE — Telephone Encounter (Signed)
Prescription printed and patient advised to pick up at the front.  Also rescheduled appointment to 12/01/21 so we can get a POC A1C at her visit.  Patient notified and verbalized understanding. For your information.

## 2021-11-24 NOTE — Telephone Encounter (Signed)
Okay to refill with paper Rx if that is her preference. Plan for A1c recheck at that visit.

## 2021-11-24 NOTE — Telephone Encounter (Signed)
Please advise if okay for written prescription.  Last fill 10/19/21 for 66mL.  Patient has a follow-up appointment 11/30/21.

## 2021-11-30 ENCOUNTER — Ambulatory Visit: Payer: Medicaid Other | Admitting: Family Medicine

## 2021-12-01 ENCOUNTER — Encounter: Payer: Self-pay | Admitting: Family Medicine

## 2021-12-01 ENCOUNTER — Ambulatory Visit (INDEPENDENT_AMBULATORY_CARE_PROVIDER_SITE_OTHER): Payer: Medicaid Other | Admitting: Family Medicine

## 2021-12-01 ENCOUNTER — Other Ambulatory Visit: Payer: Self-pay

## 2021-12-01 VITALS — BP 118/74 | HR 78 | Ht 60.0 in | Wt 206.0 lb

## 2021-12-01 DIAGNOSIS — E1142 Type 2 diabetes mellitus with diabetic polyneuropathy: Secondary | ICD-10-CM

## 2021-12-01 DIAGNOSIS — I10 Essential (primary) hypertension: Secondary | ICD-10-CM

## 2021-12-01 DIAGNOSIS — L02214 Cutaneous abscess of groin: Secondary | ICD-10-CM

## 2021-12-01 LAB — POCT GLYCOSYLATED HEMOGLOBIN (HGB A1C): Hemoglobin A1C: 5.4 % (ref 4.0–5.6)

## 2021-12-01 MED ORDER — SEMAGLUTIDE (1 MG/DOSE) 4 MG/3ML ~~LOC~~ SOPN: 1.0000 mg | PEN_INJECTOR | SUBCUTANEOUS | 0 refills | Status: DC

## 2021-12-01 NOTE — Assessment & Plan Note (Signed)
Chronic condition with initial BP reading elevated, recheck reveals normal range which is consistent with her previous recordings.  She has been compliant with lisinopril 10 mg daily.  Has not required furosemide 20 mg.  Chronic, stable condition.

## 2021-12-01 NOTE — Assessment & Plan Note (Signed)
Chronic condition that is stable.  Patient has been tolerating semaglutide 1 mg/week well without significant GI adverse effects or adverse effects reported otherwise.  She continues to demonstrate steady weight loss.  We did obtain point-of-care A1c recheck today which reveals level to be at 5.4, demonstrating an excellent reduction over previous value of 6.8 from 08/30/2021.  We discussed the possible utility of further titration up versus adverse effect profile.  Medication management was performed and we will continue with current dose given excellent glycemic control and noted weight loss.  Rx provided today.  Chronic condition, stable, medication management.

## 2021-12-01 NOTE — Progress Notes (Signed)
Primary Care / Sports Medicine Office Visit  Patient Information:  Patient ID: Carol Collins, female DOB: 04-08-1976 Age: 46 y.o. MRN: [id number]   Carol Collins is a pleasant 46 y.o. 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  No results found.   Independent interpretation of notes and tests performed by another provider:   None  Procedures performed:   None  Pertinent History, Exam, Impression, and Recommendations:   Essential hypertension Chronic condition with initial BP reading elevated, recheck reveals normal range which is consistent with her previous recordings.  She has been compliant with lisinopril 10 mg daily.  Has not required furosemide 20 mg.  Chronic, stable condition.  DM type 2 with diabetic peripheral neuropathy (HCC) Chronic condition that is stable.  Patient has been tolerating semaglutide 1 mg/week well without significant GI adverse  effects or adverse effects reported otherwise.  She continues to demonstrate steady weight loss.  We did obtain point-of-care A1c recheck today which reveals level to be at 5.4, demonstrating an excellent reduction over previous value of 6.8 from 08/30/2021.  We discussed the possible utility of further titration up versus adverse effect profile.  Medication management was performed and we will continue with current dose given excellent glycemic control and noted weight loss.  Rx provided today.  Chronic condition, stable, medication management.  Abscess of groin, right This issue has persisted despite previous Rx management with antibiotics.  No worsening, continues to note mild drainage, no significant improvement.  This is in the setting of improved blood sugars.  Given the persistence of findings I did discuss additional treatment strategies and she is amenable to further evaluation for management options through general surgery (possible incision and drainage) among other treatments that they may review with the patient.  A referral was placed today in that regard.  Chronic issue with exacerbation.   Orders & Medications Meds ordered this encounter  Medications   Semaglutide, 1 MG/DOSE, 4 MG/3ML SOPN    Sig: Inject 1 mg as directed once a week.    Dispense:  9 mL    Refill:  0   Orders Placed This Encounter  Procedures   Ambulatory referral to General Surgery   POCT HgB A1C     Return in about 3 months (around 03/01/2022).     Jerrol Banana, MD   Primary Care Sports  [place]

## 2021-12-01 NOTE — Assessment & Plan Note (Signed)
This issue has persisted despite previous Rx management with antibiotics.  No worsening, continues to note mild drainage, no significant improvement.  This is in the setting of improved blood sugars.  Given the persistence of findings I did discuss additional treatment strategies and she is amenable to further evaluation for management options through general surgery (possible incision and drainage) among other treatments that they may review with the patient.  A referral was placed today in that regard.  Chronic issue with exacerbation.

## 2021-12-01 NOTE — Patient Instructions (Signed)
-   Continue current medication - Follow-up with general surgeon - Return in 3 months - Contact for questions

## 2021-12-12 ENCOUNTER — Ambulatory Visit (INDEPENDENT_AMBULATORY_CARE_PROVIDER_SITE_OTHER): Payer: Medicaid Other | Admitting: Surgery

## 2021-12-12 ENCOUNTER — Other Ambulatory Visit: Payer: Self-pay

## 2021-12-12 ENCOUNTER — Encounter: Payer: Self-pay | Admitting: Surgery

## 2021-12-12 VITALS — BP 129/79 | HR 85 | Temp 98.1°F | Ht 62.0 in | Wt 204.2 lb

## 2021-12-12 DIAGNOSIS — L732 Hidradenitis suppurativa: Secondary | ICD-10-CM

## 2021-12-12 MED ORDER — DOXYCYCLINE HYCLATE 100 MG PO TABS: 100.0000 mg | ORAL_TABLET | Freq: Two times a day (BID) | ORAL | 0 refills | Status: DC

## 2021-12-12 NOTE — Patient Instructions (Addendum)
Please pick up your prescription at your pharmacy.   If you have any concerns or questions, please feel free to call our office. See follow up appointment below.   Hidradenitis Suppurativa Hidradenitis suppurativa is a long-term (chronic) skin disease. It is similar to a severe form of acne, but it affects areas of the body where acne would be unusual, especially areas of the body where skin rubs against skin and becomes moist. These include: Underarms. Groin. Genital area. Buttocks. Upper thighs. Breasts. Hidradenitis suppurativa may start out as small lumps or pimples caused by blocked sweat glands or hair follicles. Pimples may develop into deep sores that break open (rupture) and drain pus. Over time, affected areas of skin may thicken and become scarred. This condition is rare and does not spread from person to person (non-contagious). What are the causes? The exact cause of this condition is not known. It may be related to: Female and female hormones. An overactive disease-fighting system (immune system). The immune system may over-react to blocked hair follicles or sweat glands and cause swelling and pus-filled sores. What increases the risk? You are more likely to develop this condition if you: Are female. Are 27-81 years old. Have a family history of hidradenitis suppurativa. Have a personal history of acne. Are overweight. Smoke. Take the medicine lithium. What are the signs or symptoms? The first symptoms are usually painful bumps in the skin, similar to pimples. The condition may get worse over time (progress), or it may only cause mild symptoms. If the disease progresses, symptoms may include: Skin bumps getting bigger and growing deeper into the skin. Bumps rupturing and draining pus. Itchy, infected skin. Skin getting thicker and scarred. Tunnels under the skin (fistulas) where pus drains from a bump. Pain during daily activities, such as pain during walking if your  groin area is affected. Emotional problems, such as stress or depression. This condition may affect your appearance and your ability or willingness to wear certain clothes or do certain activities. How is this diagnosed? This condition is diagnosed by a health care provider who specializes in skin diseases (dermatologist). You may be diagnosed based on: Your symptoms and medical history. A physical exam. Testing a pus sample for infection. Blood tests. How is this treated? Your treatment will depend on how severe your symptoms are. The same treatment will not work for everybody with this condition. You may need to try several treatments to find what works best for you. Treatment may include: Cleaning and bandaging (dressing) your wounds as needed. Lifestyle changes, such as new skin care routines. Taking medicines, such as: Antibiotics. Acne medicines. Medicines to reduce the activity of the immune system. A diabetes medicine (metformin). Birth control pills, for women. Steroids to reduce swelling and pain. Working with a mental health care provider, if you experience emotional distress due to this condition. If you have severe symptoms that do not get better with medicine, you may need surgery. Surgery may involve: Using a laser to clear the skin and remove hair follicles. Opening and draining deep sores. Removing the areas of skin that are diseased and scarred. Follow these instructions at home: Medicines  Take over-the-counter and prescription medicines only as told by your health care provider. If you were prescribed an antibiotic medicine, take it as told by your health care provider. Do not stop taking the antibiotic even if your condition improves. Skin care If you have open wounds, cover them with a clean dressing as told by your health care  provider. Keep wounds clean by washing them gently with soap and water when you bathe. Do not shave the areas where you get hidradenitis  suppurativa. Do not wear deodorant. Wear loose-fitting clothes. Try to avoid getting overheated or sweaty. If you get sweaty or wet, change into clean, dry clothes as soon as you can. To help relieve pain and itchiness, cover sore areas with a warm, clean washcloth (warm compress) for 5-10 minutes as often as needed. If told by your health care provider, take a bleach bath twice a week: Fill your bathtub halfway with water. Pour in  cup of unscented household bleach. Soak in the tub for 5-10 minutes. Only soak from the neck down. Avoid water on your face and hair. Shower to rinse off the bleach from your skin. General instructions Learn as much as you can about your disease so that you have an active role in your treatment. Work closely with your health care provider to find treatments that work for you. If you are overweight, work with your health care provider to lose weight as recommended. Do not use any products that contain nicotine or tobacco, such as cigarettes and e-cigarettes. If you need help quitting, ask your health care provider. If you struggle with living with this condition, talk with your health care provider or work with a mental health care provider as recommended. Keep all follow-up visits as told by your health care provider. This is important. Where to find more information Hidradenitis Suppurativa Foundation, Inc.: https://www.hs-foundation.org/ American Academy of Dermatology: [employer] Contact a health care provider if you have: A flare-up of hidradenitis suppurativa. A fever or chills. Trouble controlling your symptoms at home. Trouble doing your daily activities because of your symptoms. Trouble dealing with emotional problems related to your condition. Summary Hidradenitis suppurativa is a long-term (chronic) skin disease. It is similar to a severe form of acne, but it affects areas of the body where acne would be unusual. The first symptoms are  usually painful bumps in the skin, similar to pimples. The condition may only cause mild symptoms, or it may get worse over time (progress). If you have open wounds, cover them with a clean dressing as told by your health care provider. Keep wounds clean by washing them gently with soap and water when you bathe. Besides skin care, treatment may include medicines, laser treatment, and surgery. This information is not intended to replace advice given to you by your health care provider. Make sure you discuss any questions you have with your health care provider. Document Revised: 08/31/2020 Document Reviewed: 08/31/2020 Elsevier Patient Education  2022 [employer].

## 2021-12-12 NOTE — Progress Notes (Signed)
12/12/2021  Reason for Visit:  Right groin wound  Requesting Provider:  Rosette Reveal, MD  History of Present Illness: Carol Collins is a 46 y.o. female presenting for evaluation of a right groin wound.  The patient saw her PCP on 11/02/21 with a 2 week history of a right groin wound just at the lateral portion of the mons pubis.  She was given a prescription for Keflex.  The patient reports that the antibiotic did not really help.  She did not notice any worsening of the wounds, but also no improvement.  She reports still having drainage from wound and some tenderness with palpation of the area.  Denies any fevers, chills, chest pain, shortness of breath.    Of note, the patient reports having had a left inframammary fold abscess that required I&D, and she's had two small areas at the inferior portion of her pannus in the past, but this is the first time in the lower groin area.  There's mention of hidradenitis in her medical chart, but the patient was not aware of this and did not know what this diagnosis meant.  Past Medical History: Past Medical History:  Diagnosis Date   Diabetes ([place])    Hyperlipidemia    Hypertension    Neuropathy      Past Surgical History: Past Surgical History:  Procedure Laterality Date   ABDOMINAL HYSTERECTOMY     CESAREAN SECTION     DIAGNOSTIC LAPAROSCOPY     laproscopy     MYRINGOTOMY WITH TUBE PLACEMENT Bilateral 08/31/2016   Procedure: MYRINGOTOMY WITH TUBE PLACEMENT;  Surgeon: Carloyn Manner, MD;  Location: [facility] ORS;  Service: ENT;  Laterality: Bilateral;    Home Medications: Prior to Admission medications   Medication Sig Start Date End Date Taking? Authorizing Provider  Accu-Chek FastClix Lancets MISC Use to check blood sugar up to 2 times daily 04/23/19  Yes Karamalegos, Devonne Doughty, DO  ACCU-CHEK GUIDE test strip Use to check blood sugar up to 2 times daily 04/23/19  Yes Karamalegos, Devonne Doughty, DO  Blood Glucose Monitoring Suppl  (ACCU-CHEK GUIDE) w/Device KIT 1 kit by Does not apply route See admin instructions. Use to check blood sugar up to 2 times daily 04/23/19  Yes Karamalegos, Devonne Doughty, DO  doxycycline (VIBRA-TABS) 100 MG tablet Take 1 tablet (100 mg total) by mouth 2 (two) times daily. 12/12/21  Yes Earlena Werst, Jacqulyn Bath, MD  fluticasone (FLONASE) 50 MCG/ACT nasal spray Place 1 spray into both nostrils daily. 11/08/21  Yes [provider]  furosemide (LASIX) 20 MG tablet Take 0.5-1 tablets (10-20 mg total) by mouth daily as needed for fluid or edema. 02/23/21  Yes Karamalegos, Devonne Doughty, DO  lisinopril (ZESTRIL) 10 MG tablet Take 1 tablet (10 mg total) by mouth daily. 06/14/21  Yes Montel Culver, MD  Semaglutide, 1 MG/DOSE, 4 MG/3ML SOPN Inject 1 mg as directed once a week. 12/01/21  Yes Montel Culver, MD  Triamcinolone Acetonide (NASACORT ALLERGY 24HR NA) Place 1 spray into both nostrils daily.   Yes [provider]    Allergies: Allergies  Allergen Reactions   Codeine Other (See Comments)    "hyperactivity"   Latex Hives and Rash    Social History:  reports that she has been smoking cigarettes. She has a 22.00 pack-year smoking history. She has never used smokeless tobacco. She reports that she does not currently use alcohol. She reports current drug use. Drug: Marijuana.   Family History: Family History  Problem Relation Age  of Onset   Heart disease Father    Diabetes Father    Hyperlipidemia Father    Hyperlipidemia Sister    Diabetes Mother    Diabetes Maternal Grandfather     Review of Systems: Review of Systems  Constitutional:  Negative for chills and fever.  Respiratory:  Negative for shortness of breath.   Cardiovascular:  Negative for chest pain.  Gastrointestinal:  Negative for abdominal pain, nausea and vomiting.  Skin:        Right lower groin wound with drainage   Physical Exam BP 129/79    Pulse 85    Temp 98.1 F (36.7 C) (Oral)    Ht _0  (1.575 m)    Wt 204  lb 3.2 oz (92.6 kg)    LMP 11/20/2013 Comment: hysterectomy   SpO2 97%    BMI 37.35 kg/m  CONSTITUTIONAL: No acute distress HEENT:  Normocephalic, atraumatic, extraocular motion intact. RESPIRATORY:  Normal respiratory effort without pathologic use of accessory muscles. CARDIOVASCULAR: Regular rhythm and rate. SKIN: In the right lower groin, next to mons pubis, the patient has a small 1.5-2 cm area with some induration and firmness and one small wound with seropurulent drainage.  However, no large area of fluctuance.  The appearance and location are consistent with hidradenitis.  NEUROLOGIC:  Motor and sensation is grossly normal.  Cranial nerves are grossly intact. PSYCH:  Alert and oriented to person, place and time. Affect is normal.  Laboratory Analysis: No results found for this or any previous visit (from the past 24 hour(s)).  Imaging: No results found.  Assessment and Plan: This is a 46 y.o. female with hidradenitis of the right groin.  --Discussed with the patient the diagnosis of hidradenitis and how it's involving the sweat glands and can cause small boils or abscesses.  These may look like blackheads as well as the pores get clogged.  This episode involves the lower inner groin and is small.  There was mild seropurulent drainage with some tenderness to palpation, but no large area of fluctuance to warrant I&D.  For now, would try to treat with oral antibiotics and will prescribe her a course of doxycycline.   --Will set up a follow up in 1 week for wound check.  If there's no improvement, may need to do I&D.  If the area improves, patient can cancel appointment. --Patient has referral with Dermatology already.  Encouraged her to ask dermatology as well about hidradenitis and if she needs any other maintenance type of therapy for it.  I spent 40 minutes dedicated to the care of this patient on the date of this encounter to include pre-visit review of records, face-to-face time with  the patient discussing diagnosis and management, and any post-visit coordination of care.   Melvyn Neth, [facility]

## 2021-12-19 ENCOUNTER — Ambulatory Visit: Payer: Medicaid Other | Admitting: Surgery

## 2021-12-21 DIAGNOSIS — Z419 Encounter for procedure for purposes other than remedying health state, unspecified: Secondary | ICD-10-CM | POA: Diagnosis not present

## 2022-01-18 DIAGNOSIS — Z419 Encounter for procedure for purposes other than remedying health state, unspecified: Secondary | ICD-10-CM | POA: Diagnosis not present

## 2022-02-18 DIAGNOSIS — Z419 Encounter for procedure for purposes other than remedying health state, unspecified: Secondary | ICD-10-CM | POA: Diagnosis not present

## 2022-03-01 ENCOUNTER — Ambulatory Visit (INDEPENDENT_AMBULATORY_CARE_PROVIDER_SITE_OTHER): Payer: Medicaid Other | Admitting: Family Medicine

## 2022-03-01 ENCOUNTER — Encounter: Payer: Self-pay | Admitting: Family Medicine

## 2022-03-01 VITALS — BP 122/82 | HR 78 | Ht 62.0 in | Wt 185.0 lb

## 2022-03-01 DIAGNOSIS — L719 Rosacea, unspecified: Secondary | ICD-10-CM | POA: Diagnosis not present

## 2022-03-01 DIAGNOSIS — L689 Hypertrichosis, unspecified: Secondary | ICD-10-CM | POA: Diagnosis not present

## 2022-03-01 DIAGNOSIS — Z720 Tobacco use: Secondary | ICD-10-CM

## 2022-03-01 DIAGNOSIS — L680 Hirsutism: Secondary | ICD-10-CM | POA: Diagnosis not present

## 2022-03-01 DIAGNOSIS — E1142 Type 2 diabetes mellitus with diabetic polyneuropathy: Secondary | ICD-10-CM | POA: Diagnosis not present

## 2022-03-01 DIAGNOSIS — L02214 Cutaneous abscess of groin: Secondary | ICD-10-CM

## 2022-03-01 DIAGNOSIS — I10 Essential (primary) hypertension: Secondary | ICD-10-CM | POA: Diagnosis not present

## 2022-03-01 NOTE — Assessment & Plan Note (Signed)
Patient has been tolerating Ozempic 1 mg weekly dose well, due for A1c today, additionally has lost roughly 20 pounds which she cites to appetite reduction.  Plan for updated A1c, ordered today.  From a medication management standpoint, pending that result, can determine the need for further titration of Ozempic versus maintenance. ?

## 2022-03-01 NOTE — Progress Notes (Signed)
?  ? ?  Primary Care / Sports Medicine Office Visit ? ?Patient Information:  ?Patient ID: Carol Collins, female DOB: [birth year] Age: 46 y.o. MRN: [id number]  ? ?Carol Collins is a pleasant 46 y.o. female presenting with the following: ? ?Chief Complaint  ?Patient presents with  ? Diabetes  ? ? ?Vitals:  ? 03/01/22 0800  ?BP: 122/82  ?Pulse: 78  ?SpO2: 97%  ? ?Vitals:  ? 03/01/22 0800  ?Weight: 185 lb (83.9 kg)  ?Height: 5\' 2"  (1.575 m)  ? ?Body mass index is 33.84 kg/m?. ? ?No results found.  ? ?Independent interpretation of notes and tests performed by another provider:  ? ?None ? ?Procedures performed:  ? ?None ? ?Pertinent History, Exam, Impression, and Recommendations:  ? ?Essential hypertension ?Chronic condition that is well controlled, has lost roughly 20 pounds over interval visit but does continue to smoke.  Smoking cessation reviewed today, will continue current BP medications. ? ?DM type 2 with diabetic peripheral neuropathy (HCC) ?Patient has been tolerating Ozempic 1 mg weekly dose well, due for A1c today, additionally has lost roughly 20 pounds which she cites to appetite reduction.  Plan for updated A1c, ordered today.  From a medication management standpoint, pending that result, can determine the need for further titration of Ozempic versus maintenance. ? ?Rosacea ?Previous referral to dermatology has been placed for this issue, unfortunately referral was closed.  We will attempt to send a new referral today as patient is still symptomatic. ? ?Tobacco abuse ?Patient continues to smoke in the setting of type 2 diabetes mellitus and hypertension, has tried to quit in the past but due to social circumstances/external stressors, was unable to do so.  Is open to possible pharmacotherapy as an adjunct.  She is not ready to quit now but plans to do so in the future, I have advised the patient to contact if interested in doing the same, if so Wellbutrin would be considered a good agent to utilize, this  was shared with the patient.  We will follow-up on this issue.  Tobacco cessation counseling x5 minutes regarding the above. ? ?Abscess of groin, right ?Patient did see general surgeon over interim regarding this issue, was advised antibiotics and close follow-up if symptoms were to recur, she has noted resolution of draining abscess, feels that the area is stable, firm, indurated.  I did advise the patient to touch base back with general surgeon if symptoms were to recur. ? ?Hirsutism ?Progressive the worsening issue with patient noting dark facial hair at the lateral eyebrows, chin, sideburn regions.  Patient does have a history of diabetes mellitus type 2, did discuss endocrine derangements in relation to hormonal changes and additional evaluation and management steps to be best coordinated through gynecology.  A referral was placed in that regard today.  I encouraged her to continue with healthy lifestyle changes for tight glycemic control.  ? ?Orders & Medications ?No orders of the defined types were placed in this encounter. ? ?Orders Placed This Encounter  ?Procedures  ? HgB A1c  ? Ambulatory referral to Dermatology  ? Ambulatory referral to Obstetrics / Gynecology  ?  ? ?Return in about 3 months (around 05/31/2022).  ?  ? ?08/01/2022, MD ? ? Primary Care Sports Medicine ?[facility] ?[facility]  ? ?

## 2022-03-01 NOTE — Assessment & Plan Note (Signed)
Chronic condition that is well controlled, has lost roughly 20 pounds over interval visit but does continue to smoke.  Smoking cessation reviewed today, will continue current BP medications. ?

## 2022-03-01 NOTE — Assessment & Plan Note (Signed)
Patient did see general surgeon over interim regarding this issue, was advised antibiotics and close follow-up if symptoms were to recur, she has noted resolution of draining abscess, feels that the area is stable, firm, indurated.  I did advise the patient to touch base back with general surgeon if symptoms were to recur. ?

## 2022-03-01 NOTE — Patient Instructions (Signed)
-   Obtain A1c lab today ?- Continue current medications ?- When ready to quit smoking ?- Review information on IBS ?- Referral coordinator should contact you in regards to scheduling a visit with dermatology and gynecology ?- Return for follow-up in 3 months ?

## 2022-03-01 NOTE — Assessment & Plan Note (Signed)
Patient continues to smoke in the setting of type 2 diabetes mellitus and hypertension, has tried to quit in the past but due to social circumstances/external stressors, was unable to do so.  Is open to possible pharmacotherapy as an adjunct.  She is not ready to quit now but plans to do so in the future, I have advised the patient to contact us if interested in doing the same, if so Wellbutrin would be considered a good agent to utilize, this was shared with the patient.  We will follow-up on this issue.  Tobacco cessation counseling x5 minutes regarding the above. ?

## 2022-03-01 NOTE — Assessment & Plan Note (Signed)
Previous referral to dermatology has been placed for this issue, unfortunately referral was closed.  We will attempt to send a new referral today as patient is still symptomatic. ?

## 2022-03-01 NOTE — Assessment & Plan Note (Signed)
Progressive the worsening issue with patient noting dark facial hair at the lateral eyebrows, chin, sideburn regions.  Patient does have a history of diabetes mellitus type 2, did discuss endocrine derangements in relation to hormonal changes and additional evaluation and management steps to be best coordinated through gynecology.  A referral was placed in that regard today.  I encouraged her to continue with healthy lifestyle changes for tight glycemic control. ?

## 2022-03-14 ENCOUNTER — Encounter: Payer: Self-pay | Admitting: Family Medicine

## 2022-03-15 DIAGNOSIS — E1142 Type 2 diabetes mellitus with diabetic polyneuropathy: Secondary | ICD-10-CM | POA: Diagnosis not present

## 2022-03-16 ENCOUNTER — Other Ambulatory Visit: Payer: Self-pay | Admitting: Family Medicine

## 2022-03-16 DIAGNOSIS — E1142 Type 2 diabetes mellitus with diabetic polyneuropathy: Secondary | ICD-10-CM

## 2022-03-16 LAB — HEMOGLOBIN A1C
Est. average glucose Bld gHb Est-mCnc: 103 mg/dL
Hgb A1c MFr Bld: 5.2 % (ref 4.8–5.6)

## 2022-03-16 MED ORDER — SEMAGLUTIDE (1 MG/DOSE) 4 MG/3ML ~~LOC~~ SOPN: 1.0000 mg | PEN_INJECTOR | SUBCUTANEOUS | 0 refills | Status: DC

## 2022-03-16 MED ORDER — SEMAGLUTIDE (1 MG/DOSE) 4 MG/3ML ~~LOC~~ SOPN: 1.0000 mg | PEN_INJECTOR | SUBCUTANEOUS | 0 refills | Status: AC

## 2022-03-20 DIAGNOSIS — Z419 Encounter for procedure for purposes other than remedying health state, unspecified: Secondary | ICD-10-CM | POA: Diagnosis not present

## 2022-04-11 ENCOUNTER — Encounter: Payer: Medicaid Other | Admitting: Licensed Practical Nurse

## 2022-04-20 DIAGNOSIS — Z419 Encounter for procedure for purposes other than remedying health state, unspecified: Secondary | ICD-10-CM | POA: Diagnosis not present

## 2022-05-20 DIAGNOSIS — Z419 Encounter for procedure for purposes other than remedying health state, unspecified: Secondary | ICD-10-CM | POA: Diagnosis not present

## 2022-05-31 ENCOUNTER — Ambulatory Visit: Payer: Medicaid Other | Admitting: Family Medicine

## 2022-06-14 ENCOUNTER — Encounter: Payer: Self-pay | Admitting: Family Medicine

## 2022-06-14 ENCOUNTER — Ambulatory Visit (INDEPENDENT_AMBULATORY_CARE_PROVIDER_SITE_OTHER): Payer: Medicaid Other | Admitting: Family Medicine

## 2022-06-14 VITALS — BP 132/80 | HR 80 | Ht 62.0 in | Wt 175.0 lb

## 2022-06-14 DIAGNOSIS — F17210 Nicotine dependence, cigarettes, uncomplicated: Secondary | ICD-10-CM | POA: Diagnosis not present

## 2022-06-14 DIAGNOSIS — Z720 Tobacco use: Secondary | ICD-10-CM

## 2022-06-14 DIAGNOSIS — I10 Essential (primary) hypertension: Secondary | ICD-10-CM | POA: Diagnosis not present

## 2022-06-14 DIAGNOSIS — E1142 Type 2 diabetes mellitus with diabetic polyneuropathy: Secondary | ICD-10-CM | POA: Diagnosis not present

## 2022-06-14 LAB — POCT GLYCOSYLATED HEMOGLOBIN (HGB A1C): Hemoglobin A1C: 4.8 % (ref 4.0–5.6)

## 2022-06-14 MED ORDER — SEMAGLUTIDE (2 MG/DOSE) 8 MG/3ML ~~LOC~~ SOPN: 2.0000 mg | PEN_INJECTOR | SUBCUTANEOUS | 5 refills | Status: DC

## 2022-06-14 NOTE — Assessment & Plan Note (Signed)
Patient has continued to demonstrate excellent weight loss in the setting of type 2 diabetes mellitus with peripheral neuropathy, she adheres to healthier diet, has reduced activity as of late which she plans on further improving.  Repeat A1c today in normal range and demonstrates interval decrease.  Plan for further titration from a weight loss standpoint, did strongly caution regarding adhering to healthy regular dietary intake, ramping up weight training for possible concern over muscle loss, and we will coordinate follow-up in 3 months.

## 2022-06-14 NOTE — Progress Notes (Signed)
     Primary Care / Sports Medicine Office Visit  Patient Information:  Patient ID: Carol Collins, female DOB: [birth year] Age: 46 y.o. MRN: [id number]   Carol Collins is a pleasant 46 y.o. female presenting with the following:  No chief complaint on file.   Vitals:   06/14/22 0809  BP: 132/80  Pulse: 80  SpO2: 98%   Vitals:   06/14/22 0809  Weight: 175 lb (79.4 kg)  Height: 5\' 2"  (1.575 m)   Body mass index is 32.01 kg/m.  No results found.   Independent interpretation of notes and tests performed by another provider:   None  Procedures performed:   None  Pertinent History, Exam, Impression, and Recommendations:   Problem List Items Addressed This Visit       Cardiovascular and Mediastinum   Essential hypertension    Mild elevation in blood pressure when compared to interval visit despite excellent continued weight loss.  We did discuss, from a medication management standpoint, further titration of lisinopril versus leaning into lifestyle modifications.  She will continue to focus on healthier eating and increasing activity which she plans to do so.  Cardiopulmonary findings today are benign save for mild inspiratory wheeze.  She will return in 3 months for reevaluation.        Endocrine   DM type 2 with diabetic peripheral neuropathy (HCC) - Primary    Patient has continued to demonstrate excellent weight loss in the setting of type 2 diabetes mellitus with peripheral neuropathy, she adheres to healthier diet, has reduced activity as of late which she plans on further improving.  Repeat A1c today in normal range and demonstrates interval decrease.  Plan for further titration from a weight loss standpoint, did strongly caution regarding adhering to healthy regular dietary intake, ramping up weight training for possible concern over muscle loss, and we will coordinate follow-up in 3 months.      Relevant Medications   Semaglutide, 2 MG/DOSE, 8 MG/3ML SOPN   Other  Relevant Orders   POCT glycosylated hemoglobin (Hb A1C) (Completed)   Urine Microalbumin w/creat. ratio     Other   Tobacco abuse    Long-term smoker who has plans for tobacco cessation, we discussed the complexity of tobacco abuse, pharmacologic and nonpharmacologic means for cessation-specifically Wellbutrin.  She is not ready to quit at this time, plans on transitioning to vaping as an interim measure.  Tobacco cessation counseling x5 minutes.        Orders & Medications Meds ordered this encounter  Medications   Semaglutide, 2 MG/DOSE, 8 MG/3ML SOPN    Sig: Inject 2 mg as directed once a week.    Dispense:  3 mL    Refill:  5   Orders Placed This Encounter  Procedures   Urine Microalbumin w/creat. ratio   POCT glycosylated hemoglobin (Hb A1C)     Return in about 3 months (around 09/14/2022).     09/16/2022, MD   Primary Care Sports Medicine [facility]

## 2022-06-14 NOTE — Assessment & Plan Note (Signed)
Mild elevation in blood pressure when compared to interval visit despite excellent continued weight loss.  We did discuss, from a medication management standpoint, further titration of lisinopril versus leaning into lifestyle modifications.  She will continue to focus on healthier eating and increasing activity which she plans to do so.  Cardiopulmonary findings today are benign save for mild inspiratory wheeze.  She will return in 3 months for reevaluation.

## 2022-06-14 NOTE — Assessment & Plan Note (Signed)
Long-term smoker who has plans for tobacco cessation, we discussed the complexity of tobacco abuse, pharmacologic and nonpharmacologic means for cessation-specifically Wellbutrin.  She is not ready to quit at this time, plans on transitioning to vaping as an interim measure.  Tobacco cessation counseling x5 minutes.

## 2022-06-14 NOTE — Patient Instructions (Signed)
-   Start new Ozempic 2 mg weekly injection - Make sure that you are taking in at least 3 healthy meals daily - Recommend further increase in your exercise/activity level, include weight/resistance training - Contact us if interested in starting Wellbutrin for smoking cessation - Return for follow-up in 3 months, contact for any questions between now and then

## 2022-06-20 DIAGNOSIS — Z419 Encounter for procedure for purposes other than remedying health state, unspecified: Secondary | ICD-10-CM | POA: Diagnosis not present

## 2022-07-12 ENCOUNTER — Ambulatory Visit: Payer: Self-pay | Admitting: *Deleted

## 2022-07-12 ENCOUNTER — Telehealth: Payer: Self-pay | Admitting: Family Medicine

## 2022-07-12 ENCOUNTER — Other Ambulatory Visit: Payer: Self-pay

## 2022-07-12 DIAGNOSIS — L719 Rosacea, unspecified: Secondary | ICD-10-CM

## 2022-07-12 DIAGNOSIS — L680 Hirsutism: Secondary | ICD-10-CM

## 2022-07-12 NOTE — Telephone Encounter (Signed)
Please review.  KP

## 2022-07-12 NOTE — Telephone Encounter (Signed)
Referral placed for Dermatology and GYN.  KP

## 2022-07-12 NOTE — Telephone Encounter (Signed)
Called pt let her know that a referral for dermatology and GYN was placed. Pt verbalized understanding.  KP

## 2022-07-12 NOTE — Telephone Encounter (Addendum)
  Chief Complaint: requesting referral- Dr Gwyneth Revels Symptoms: pain in toe caused by nail growing in wrong direction- outward/upward Frequency: chronic- has had ingrown nail before and half of nail removed- grew back thick and in wrong direction  Pertinent Negatives: Patient denies   Disposition: [] ED /[] Urgent Care (no appt availability in office) / [] Appointment(In office/virtual)/ []  [place] [place] [facility]/ [] Home Care/ [x] Refused Recommended Disposition /[] [facility]/ [x]  Follow-up with PCP Additional Notes: Patient advised she may need to be seen for referral- but she states she was seen October 31,2022 and her nails have been chronic problem   Reason for Disposition  [1] MODERATE pain (e.g., interferes with normal activities, limping) AND [2] present > 3 days  Answer Assessment - Initial Assessment Questions 1. ONSET: "When did the pain start?"      Chronic- had treatment on nail and growing upward 2. LOCATION: "Where is the pain located?"   (e.g., around nail, entire toe, at foot joint)      Great toe- left 3. PAIN: "How bad is the pain?"    (Scale 1-10; or mild, moderate, severe)   -  MILD (1-3): doesn't interfere with normal activities    -  MODERATE (4-7): interferes with normal activities (e.g., work or school) or awakens from sleep, limping    -  SEVERE (8-10): excruciating pain, unable to do any normal activities, unable to walk     Mild/moderate 4. APPEARANCE: "What does the toe look like?" (e.g., redness, swelling, bruising, pallor)     Nail growing upward 5. CAUSE: "What do you think is causing the toe pain?"     Nail pressure 6. OTHER SYMPTOMS: "Do you have any other symptoms?" (e.g., leg pain, rash, fever, numbness)      none 7. PREGNANCY: "Is there any chance you are pregnant?" "When was your last menstrual period?"  Protocols used: Toe Pain-A-AH

## 2022-07-12 NOTE — Telephone Encounter (Signed)
Copied from CRM [id number]. Topic: Referral - Request for Referral >> Jul 12, 2022  1:53 PM Carol Collins wrote: Has patient seen PCP for this complaint? Yes.   *If NO, is insurance requiring patient see PCP for this issue before PCP can refer them? Referral for which specialty: Dermatology  Preferred provider/office: Patient has no preference but would like to be seen this year (2023) or as soon as available  Reason for referral: Redness

## 2022-07-12 NOTE — Telephone Encounter (Signed)
Copied from CRM [id number]. Topic: Referral - Request for Referral >> Jul 12, 2022 11:07 AM Marlow Baars wrote: Patient called in about previous referrals from April to an OB/Gyn and Dermatologist. She said she canceled appointments because she was told she was no longer covered but she found out that was wrong and is she covered. She would like the referrals resent to both Specialties please. Please assist patient further.

## 2022-07-13 ENCOUNTER — Other Ambulatory Visit: Payer: Self-pay

## 2022-07-13 DIAGNOSIS — M79675 Pain in left toe(s): Secondary | ICD-10-CM

## 2022-07-13 NOTE — Telephone Encounter (Signed)
Pt informed and verbalized understanding.  KP 

## 2022-07-13 NOTE — Telephone Encounter (Signed)
Referral placed.  KP 

## 2022-07-21 DIAGNOSIS — Z419 Encounter for procedure for purposes other than remedying health state, unspecified: Secondary | ICD-10-CM | POA: Diagnosis not present

## 2022-08-02 DIAGNOSIS — L03031 Cellulitis of right toe: Secondary | ICD-10-CM | POA: Diagnosis not present

## 2022-08-02 DIAGNOSIS — E1142 Type 2 diabetes mellitus with diabetic polyneuropathy: Secondary | ICD-10-CM | POA: Diagnosis not present

## 2022-08-20 DIAGNOSIS — Z419 Encounter for procedure for purposes other than remedying health state, unspecified: Secondary | ICD-10-CM | POA: Diagnosis not present

## 2022-08-28 NOTE — Progress Notes (Unsigned)
Carol Culver, MD   No chief complaint on file.   HPI:      [name] is a 46 y.o. No obstetric history on file. 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   Morbid obesity ([place]) 04/22/2019   Hidradenitis suppurativa 05/23/2017   Dizziness 05/04/2016   Pedal edema 11/09/2015   Iron deficiency anemia 10/08/2015   DM type 2 with diabetic peripheral neuropathy ([place]) 10/08/2015   Hyperesthesia 10/08/2015   Allergic rhinitis 10/08/2015   Tobacco abuse 10/08/2015    Past Surgical History:  Procedure Laterality Date   ABDOMINAL HYSTERECTOMY     CESAREAN SECTION     DIAGNOSTIC LAPAROSCOPY     laproscopy     MYRINGOTOMY WITH TUBE PLACEMENT Bilateral 08/31/2016   Procedure: MYRINGOTOMY WITH TUBE PLACEMENT;  Surgeon: Carloyn Manner, MD;  Location: [facility] ORS;  Service: ENT;  Laterality: Bilateral;    Family History  Problem Relation Age of Onset   Diabetes Mother    Heart disease Father    Diabetes Father    Hyperlipidemia Father    Hyperlipidemia Sister    Diabetes Maternal Grandfather     Social History   Socioeconomic History   Marital status: Divorced    Spouse name: Not on file   Number of children: 4   Years of education: 16   Highest education level: Bachelor's degree (e.g., BA, AB, BS)  Occupational History   Not on file  Tobacco Use   Smoking status: Every Day    Packs/day: 1.00    Years: 22.00    Total pack years: 22.00    Types: Cigarettes   Smokeless tobacco: Never  Vaping Use   Vaping Use: Some days    Substances: THC  Substance and Sexual Activity   Alcohol use: Not Currently   Drug use: Yes    Types: Marijuana   Sexual activity: Yes    Partners: Male    Birth control/protection: None  Other Topics Concern   Not on file  Social History Narrative   Not on file   Social Determinants of Health   Financial Resource Strain: Not on file  Food Insecurity: Not on file  Transportation Needs: Not on file  Physical Activity: Not on file  Stress: Not on file  Social Connections: Not on file  Intimate Partner Violence: Not on file    Outpatient Medications Prior to Visit  Medication Sig Dispense Refill   Accu-Chek FastClix Lancets MISC Use to check blood sugar up to 2 times daily 204 each 3   ACCU-CHEK GUIDE test strip Use to check blood sugar up to 2 times daily 200 each 3   Blood Glucose Monitoring Suppl (ACCU-CHEK GUIDE) w/Device KIT 1 kit by Does not apply route See admin instructions. Use to check blood sugar up to 2 times daily 1 kit 0   lisinopril (ZESTRIL) 10 MG tablet Take 1 tablet (10 mg total) by mouth daily. 30 tablet 3   Multiple Vitamins-Minerals (MULTIVITAL PO) Take by mouth daily.     Semaglutide, 2 MG/DOSE,  8 MG/3ML SOPN Inject 2 mg as directed once a week. 3 mL 5   Triamcinolone Acetonide (NASACORT ALLERGY 24HR NA) Place 1 spray into both nostrils daily.     No facility-administered medications prior to visit.      ROS:  Review of Systems BREAST: No symptoms   OBJECTIVE:   Vitals:  LMP 11/20/2013 Comment: hysterectomy  Physical Exam  Results: No results found for this or any previous visit (from the past 24 hour(s)).   Assessment/Plan: No diagnosis found.    No orders of the defined types were placed in this encounter.     No follow-ups on file.  Cecily Lawhorne B. Hosteen Kienast, PA-C 08/28/2022 1:49 PM

## 2022-08-29 ENCOUNTER — Ambulatory Visit (INDEPENDENT_AMBULATORY_CARE_PROVIDER_SITE_OTHER): Payer: Medicaid Other | Admitting: Obstetrics and Gynecology

## 2022-08-29 ENCOUNTER — Encounter: Payer: Self-pay | Admitting: Obstetrics and Gynecology

## 2022-08-29 VITALS — BP 126/84 | Ht 62.0 in | Wt 167.0 lb

## 2022-08-29 DIAGNOSIS — Z1231 Encounter for screening mammogram for malignant neoplasm of breast: Secondary | ICD-10-CM

## 2022-08-29 DIAGNOSIS — N6452 Nipple discharge: Secondary | ICD-10-CM | POA: Diagnosis not present

## 2022-08-29 DIAGNOSIS — Z803 Family history of malignant neoplasm of breast: Secondary | ICD-10-CM | POA: Diagnosis not present

## 2022-08-29 DIAGNOSIS — L680 Hirsutism: Secondary | ICD-10-CM

## 2022-08-29 NOTE — Patient Instructions (Signed)
I value your feedback and you entrusting us with your care. If you get a [facility] [facility] patient survey, I would appreciate you taking the time to let us know about your experience today. Thank you! ? ? ?

## 2022-08-30 LAB — TSH: TSH: 0.485 u[IU]/mL (ref 0.450–4.500)

## 2022-08-30 LAB — PROLACTIN: Prolactin: 5 ng/mL (ref 4.8–23.3)

## 2022-09-12 ENCOUNTER — Ambulatory Visit
Admission: RE | Admit: 2022-09-12 | Discharge: 2022-09-12 | Disposition: A | Discharge status: Home / Self Care | Payer: Medicaid Other | Source: Ambulatory Visit | Attending: Obstetrics and Gynecology | Admitting: Obstetrics and Gynecology

## 2022-09-12 ENCOUNTER — Ambulatory Visit: Payer: Medicaid Other

## 2022-09-12 DIAGNOSIS — Z1231 Encounter for screening mammogram for malignant neoplasm of breast: Secondary | ICD-10-CM | POA: Diagnosis not present

## 2022-09-12 DIAGNOSIS — Z803 Family history of malignant neoplasm of breast: Secondary | ICD-10-CM | POA: Diagnosis not present

## 2022-09-14 ENCOUNTER — Ambulatory Visit (INDEPENDENT_AMBULATORY_CARE_PROVIDER_SITE_OTHER): Payer: Medicaid Other | Admitting: Family Medicine

## 2022-09-14 ENCOUNTER — Encounter: Payer: Self-pay | Admitting: Family Medicine

## 2022-09-14 ENCOUNTER — Ambulatory Visit: Payer: Medicaid Other | Admitting: Family Medicine

## 2022-09-14 VITALS — BP 130/78 | HR 73 | Ht 62.0 in | Wt 167.0 lb

## 2022-09-14 DIAGNOSIS — E1142 Type 2 diabetes mellitus with diabetic polyneuropathy: Secondary | ICD-10-CM

## 2022-09-14 DIAGNOSIS — I10 Essential (primary) hypertension: Secondary | ICD-10-CM | POA: Diagnosis not present

## 2022-09-14 LAB — POCT GLYCOSYLATED HEMOGLOBIN (HGB A1C): Hemoglobin A1C: 4.8 % (ref 4.0–5.6)

## 2022-09-14 MED ORDER — SEMAGLUTIDE (2 MG/DOSE) 8 MG/3ML ~~LOC~~ SOPN: 0.5000 mg | PEN_INJECTOR | SUBCUTANEOUS | 5 refills | Status: DC

## 2022-09-14 MED ORDER — LISINOPRIL 10 MG PO TABS: 10.0000 mg | ORAL_TABLET | Freq: Every day | ORAL | 0 refills | Status: DC

## 2022-09-14 NOTE — Progress Notes (Signed)
     Primary Care / Sports Medicine Office Visit  Patient Information:  Patient ID: Carol Collins, female DOB: [birth year] Age: 46 y.o. MRN: [id number]   [name] is a pleasant 46 y.o. female presenting with the following:  Chief Complaint  Patient presents with   DM type 2 with diabetic peripheral neuropathy     Running out of insurance next week    Vitals:   09/14/22 1551 09/14/22 1623  BP: (!) 140/82 130/78  Pulse: 73   SpO2: 97%    Vitals:   09/14/22 1551  Weight: 167 lb (75.8 kg)  Height: 5\' 2"  (1.575 m)   Body mass index is 30.54 kg/m.     Independent interpretation of notes and tests performed by another provider:   None  Procedures performed:   None  Pertinent History, Exam, Impression, and Recommendations:   Problem List Items Addressed This Visit       Cardiovascular and Mediastinum   Essential hypertension    Repeat BP improved, in the setting of regular exercise, continued weight loss.  Continued lifestyle interventions encouraged, we will continue lisinopril at same dose, follow-up in 3 months.  If persistent elevation noted despite continued lifestyle changes, further titration to be considered.      Relevant Medications   lisinopril (ZESTRIL) 10 MG tablet     Endocrine   DM type 2 with diabetic peripheral neuropathy ([place]) - Primary    Continued decrease in A1c noted, making healthy lifestyle changes, titrate Ozempic down to 0.5 mg weekly, reevaluation in 3 months.      Relevant Medications   lisinopril (ZESTRIL) 10 MG tablet   Semaglutide, 2 MG/DOSE, 8 MG/3ML SOPN   Other Relevant Orders   POCT glycosylated hemoglobin (Hb A1C) (Completed)     Orders & Medications Meds ordered this encounter  Medications   lisinopril (ZESTRIL) 10 MG tablet    Sig: Take 1 tablet (10 mg total) by mouth daily.    Dispense:  90 tablet    Refill:  0   Semaglutide, 2 MG/DOSE, 8 MG/3ML SOPN    Sig: Inject 0.5 mg as directed once a week.    Dispense:   3 mL    Refill:  5   Orders Placed This Encounter  Procedures   POCT glycosylated hemoglobin (Hb A1C)     Return in about 3 months (around 12/15/2022).     Montel Culver, MD   Primary Care Sports Medicine [place]

## 2022-09-14 NOTE — Patient Instructions (Addendum)
-   Transition to Ozempic 0.5 mg weekly - Continue lisinopril 10 mg daily - Provide urine sample to lab - Continue healthy lifestyle changes - Return follow-up in 3 months

## 2022-09-14 NOTE — Assessment & Plan Note (Signed)
Continued decrease in A1c noted, making healthy lifestyle changes, titrate Ozempic down to 0.5 mg weekly, reevaluation in 3 months.

## 2022-09-14 NOTE — Assessment & Plan Note (Signed)
Repeat BP improved, in the setting of regular exercise, continued weight loss.  Continued lifestyle interventions encouraged, we will continue lisinopril at same dose, follow-up in 3 months.  If persistent elevation noted despite continued lifestyle changes, further titration to be considered.

## 2022-09-20 DIAGNOSIS — Z419 Encounter for procedure for purposes other than remedying health state, unspecified: Secondary | ICD-10-CM | POA: Diagnosis not present

## 2022-10-20 DIAGNOSIS — Z419 Encounter for procedure for purposes other than remedying health state, unspecified: Secondary | ICD-10-CM | POA: Diagnosis not present

## 2022-11-20 DIAGNOSIS — Z419 Encounter for procedure for purposes other than remedying health state, unspecified: Secondary | ICD-10-CM | POA: Diagnosis not present

## 2022-12-13 ENCOUNTER — Encounter: Payer: Self-pay | Admitting: Family Medicine

## 2022-12-13 ENCOUNTER — Ambulatory Visit: Payer: Medicaid Other | Admitting: Family Medicine

## 2022-12-21 DIAGNOSIS — Z419 Encounter for procedure for purposes other than remedying health state, unspecified: Secondary | ICD-10-CM | POA: Diagnosis not present

## 2023-01-19 DIAGNOSIS — Z419 Encounter for procedure for purposes other than remedying health state, unspecified: Secondary | ICD-10-CM | POA: Diagnosis not present

## 2023-02-19 DIAGNOSIS — Z419 Encounter for procedure for purposes other than remedying health state, unspecified: Secondary | ICD-10-CM | POA: Diagnosis not present

## 2023-02-22 ENCOUNTER — Ambulatory Visit: Payer: Medicaid Other | Admitting: Dermatology

## 2023-03-21 DIAGNOSIS — Z419 Encounter for procedure for purposes other than remedying health state, unspecified: Secondary | ICD-10-CM | POA: Diagnosis not present

## 2023-03-27 ENCOUNTER — Ambulatory Visit (INDEPENDENT_AMBULATORY_CARE_PROVIDER_SITE_OTHER): Payer: Medicaid Other | Admitting: Family Medicine

## 2023-03-27 ENCOUNTER — Encounter: Payer: Self-pay | Admitting: Family Medicine

## 2023-03-27 VITALS — BP 128/82 | HR 70 | Ht 62.0 in | Wt 171.2 lb

## 2023-03-27 DIAGNOSIS — E1142 Type 2 diabetes mellitus with diabetic polyneuropathy: Secondary | ICD-10-CM

## 2023-03-27 DIAGNOSIS — Z7985 Long-term (current) use of injectable non-insulin antidiabetic drugs: Secondary | ICD-10-CM

## 2023-03-27 DIAGNOSIS — I10 Essential (primary) hypertension: Secondary | ICD-10-CM

## 2023-03-27 DIAGNOSIS — J309 Allergic rhinitis, unspecified: Secondary | ICD-10-CM | POA: Diagnosis not present

## 2023-03-27 LAB — POCT GLYCOSYLATED HEMOGLOBIN (HGB A1C): Hemoglobin A1C: 4.6 % (ref 4.0–5.6)

## 2023-03-27 MED ORDER — TRIAMCINOLONE ACETONIDE 55 MCG/ACT NA AERO: 2.0000 | INHALATION_SPRAY | Freq: Every day | NASAL | 11 refills | Status: DC

## 2023-03-27 MED ORDER — SEMAGLUTIDE (1 MG/DOSE) 4 MG/3ML ~~LOC~~ SOPN: 1.0000 mg | PEN_INJECTOR | SUBCUTANEOUS | 3 refills | Status: DC

## 2023-03-27 NOTE — Assessment & Plan Note (Signed)
Interval improved, denies cardiopulmonary complaints.  -Lifestyle interventions encouraged - Persistent symptoms can be addressed with medication titration

## 2023-03-27 NOTE — Assessment & Plan Note (Signed)
Chronic, well-controlled, has been dosing Ozempic 2 mg due to supply issues.  Denies any adverse effects.  Continues to note steady A1c improvement, no hypoglycemia symptoms.  -Titrate down from Ozempic 2 mg weekly to 1 mg weekly with plans for further titration down - Encouraged healthy lifestyle changes

## 2023-03-27 NOTE — Patient Instructions (Addendum)
-   Transition to Ozempic (semaglutide) 1 mg weekly - Reviewed information attached and continue to make healthy lifestyle changes - Add on Mucinex to help with allergy symptoms, can contact us next week if still symptomatic - Return for annual physical as scheduled

## 2023-03-27 NOTE — Progress Notes (Signed)
     Primary Care / Sports Medicine Office Visit  Patient Information:  Patient ID: Carol Collins, female DOB: 03-Oct-1976 Age: 47 y.o. MRN: [id number]   Carol Collins is a pleasant 47 y.o. female presenting with the following:  Chief Complaint  Patient presents with   DM type 2 with diabetic peripheral neuropathy   Ear Pain    Right ear 3-4 day painful    Vitals:   03/27/23 0801  BP: 128/82  Pulse: 70  SpO2: 100%   Vitals:   03/27/23 0801  Weight: 171 lb 3.2 oz (77.7 kg)  Height: 5\' 2"  (1.575 m)   Body mass index is 31.31 kg/m.  No results found.   Independent interpretation of notes and tests performed by another provider:   None  Procedures performed:   None  Pertinent History, Exam, Impression, and Recommendations:   Carol Collins was seen today for dm type 2 with diabetic peripheral neuropathy and ear pain.  DM type 2 with diabetic peripheral neuropathy (HCC) Assessment & Plan: Chronic, well-controlled, has been dosing Ozempic 2 mg due to supply issues.  Denies any adverse effects.  Continues to note steady A1c improvement, no hypoglycemia symptoms.  -Titrate down from Ozempic 2 mg weekly to 1 mg weekly with plans for further titration down - Encouraged healthy lifestyle changes  Orders: -     POCT glycosylated hemoglobin (Hb A1C) -     Semaglutide (1 MG/DOSE); Inject 1 mg as directed once a week.  Dispense: 3 mL; Refill: 3  Essential hypertension Assessment & Plan: Interval improved, denies cardiopulmonary complaints.  -Lifestyle interventions encouraged - Persistent symptoms can be addressed with medication titration   Allergic rhinitis, unspecified seasonality, unspecified trigger Assessment & Plan: 3-4 days of symptoms, resolving as of today, primarily involving right ear.  Examination with benign appearing tympanic membrane, some clear fluid posteriorly, contralateral benign, canals benign, oropharynx nasopharynx benign, lung fields clear to  auscultation bilaterally.  - Continue current regimen involving intranasal steroid, oral antihistamine - Add on mucolytic (guaifenesin) - Can contact us next week if symptoms persist to modify treatment   Other orders -     Triamcinolone Acetonide; Place 2 sprays into the nose daily.  Dispense: 16.9 mL; Refill: 11     Orders & Medications Meds ordered this encounter  Medications   triamcinolone (NASACORT ALLERGY 24HR) 55 MCG/ACT AERO nasal inhaler    Sig: Place 2 sprays into the nose daily.    Dispense:  16.9 mL    Refill:  11   Semaglutide, 1 MG/DOSE, 4 MG/3ML SOPN    Sig: Inject 1 mg as directed once a week.    Dispense:  3 mL    Refill:  3   Orders Placed This Encounter  Procedures   POCT glycosylated hemoglobin (Hb A1C)     Return in about 6 months (around 09/27/2023) for CPE.     Jerrol Banana, MD, [facility]   Primary Care Sports Medicine Primary Care and Sports Medicine at [facility]

## 2023-03-27 NOTE — Assessment & Plan Note (Signed)
3-4 days of symptoms, resolving as of today, primarily involving right ear.  Examination with benign appearing tympanic membrane, some clear fluid posteriorly, contralateral benign, canals benign, oropharynx nasopharynx benign, lung fields clear to auscultation bilaterally.  - Continue current regimen involving intranasal steroid, oral antihistamine - Add on mucolytic (guaifenesin) - Can contact us next week if symptoms persist to modify treatment

## 2023-04-21 DIAGNOSIS — Z419 Encounter for procedure for purposes other than remedying health state, unspecified: Secondary | ICD-10-CM | POA: Diagnosis not present

## 2023-05-21 DIAGNOSIS — Z419 Encounter for procedure for purposes other than remedying health state, unspecified: Secondary | ICD-10-CM | POA: Diagnosis not present

## 2023-06-21 DIAGNOSIS — Z419 Encounter for procedure for purposes other than remedying health state, unspecified: Secondary | ICD-10-CM | POA: Diagnosis not present

## 2023-06-25 ENCOUNTER — Encounter: Payer: Self-pay | Admitting: Family Medicine

## 2023-06-25 ENCOUNTER — Ambulatory Visit: Payer: Medicaid Other | Admitting: Family Medicine

## 2023-06-25 VITALS — BP 118/78 | HR 80 | Ht 62.0 in | Wt 163.0 lb

## 2023-06-25 DIAGNOSIS — Z720 Tobacco use: Secondary | ICD-10-CM

## 2023-06-25 DIAGNOSIS — M65319 Trigger thumb, unspecified thumb: Secondary | ICD-10-CM | POA: Diagnosis not present

## 2023-06-25 MED ORDER — BUPROPION HCL ER (SR) 150 MG PO TB12: ORAL_TABLET | ORAL | 0 refills | Status: DC

## 2023-06-26 DIAGNOSIS — M65319 Trigger thumb, unspecified thumb: Secondary | ICD-10-CM | POA: Insufficient documentation

## 2023-06-26 NOTE — Progress Notes (Signed)
     Primary Care / Sports Medicine Office Visit  Patient Information:  Patient ID: Carol Collins, female DOB: 06-22-1976 Age: 47 y.o. MRN: [id number]   Carol Collins is a pleasant 47 y.o. female presenting with the following:  Chief Complaint  Patient presents with   thumb pain    Right thumb pain, Woke up last week (Friday)with thumb pain, no know injury    Vitals:   06/25/23 1439  BP: 118/78  Pulse: 80  SpO2: 97%   Vitals:   06/25/23 1439  Weight: 163 lb (73.9 kg)  Height: 5\' 2"  (1.575 m)   Body mass index is 29.81 kg/m.  No results found.   Independent interpretation of notes and tests performed by another provider:   None  Procedures performed:   None  Pertinent History, Exam, Impression, and Recommendations:   Carol Collins was seen today for thumb pain.  Stenosing tenosynovitis of thumb Assessment & Plan: 1 week history of atraumatic right thumb pain, volar. Has been using thumb spica with benefit. Exam consistent with stenosing tenosynovitis of right thumb.  Plan: - Thumb spica brace x 1-2 weeks - Use Pennsaid twice daily x 1-2 weeks then as-needed - Start home rehab after 1 week - Contact for persistent symptoms - Can address with [facility]   Tobacco abuse Assessment & Plan: Patient is ready to start tobacco cessation, chronic issue. We reviewed pharmacologic options and she will proceed with plan.  - Wellbutrin 150 mg daily x 3 days then BID - Plan to quit smoking on day 7 - Remain on medication x 3 months  Orders: -     buPROPion HCl ER (SR); Take 150 mg PO once daily for 3 days then increase to 150 mg PO twice daily. Plan to stop smoking 7 days after starting medication.  Dispense: 180 tablet; Refill: 0     Orders & Medications Meds ordered this encounter  Medications   buPROPion (WELLBUTRIN SR) 150 MG 12 hr tablet    Sig: Take 150 mg PO once daily for 3 days then increase to 150 mg PO twice daily. Plan to stop smoking 7 days after starting  medication.    Dispense:  180 tablet    Refill:  0   No orders of the defined types were placed in this encounter.    No follow-ups on file.     Jerrol Banana, MD, [facility]   Primary Care Sports Medicine Primary Care and Sports Medicine at [facility]

## 2023-06-26 NOTE — Patient Instructions (Addendum)
For thumb pain - Thumb spica brace x 1-2 weeks - Use Pennsaid twice daily x 1-2 weeks then as-needed - Start home rehab after 1 week - Contact for persistent symptoms  For smoking cessation - Wellbutrin 150 mg daily x 3 days then BID - Plan to quit smoking on day 7 - Remain on medication x 3 months

## 2023-06-26 NOTE — Assessment & Plan Note (Addendum)
Patient is ready to start tobacco cessation, chronic issue. We reviewed pharmacologic options and she will proceed with plan.  - Wellbutrin 150 mg daily x 3 days then BID - Plan to quit smoking on day 7 - Remain on medication x 3 months

## 2023-06-26 NOTE — Assessment & Plan Note (Signed)
1 week history of atraumatic right thumb pain, volar. Has been using thumb spica with benefit. Exam consistent with stenosing tenosynovitis of right thumb.  Plan: - Thumb spica brace x 1-2 weeks - Use Pennsaid twice daily x 1-2 weeks then as-needed - Start home rehab after 1 week - Contact for persistent symptoms - Can address with [facility]

## 2023-06-30 DIAGNOSIS — E119 Type 2 diabetes mellitus without complications: Secondary | ICD-10-CM | POA: Diagnosis not present

## 2023-06-30 DIAGNOSIS — Z91040 Latex allergy status: Secondary | ICD-10-CM | POA: Diagnosis not present

## 2023-06-30 DIAGNOSIS — M25471 Effusion, right ankle: Secondary | ICD-10-CM | POA: Diagnosis not present

## 2023-06-30 DIAGNOSIS — S80861A Insect bite (nonvenomous), right lower leg, initial encounter: Secondary | ICD-10-CM | POA: Diagnosis not present

## 2023-06-30 DIAGNOSIS — Z7985 Long-term (current) use of injectable non-insulin antidiabetic drugs: Secondary | ICD-10-CM | POA: Diagnosis not present

## 2023-06-30 DIAGNOSIS — Z9109 Other allergy status, other than to drugs and biological substances: Secondary | ICD-10-CM | POA: Diagnosis not present

## 2023-06-30 DIAGNOSIS — Z885 Allergy status to narcotic agent status: Secondary | ICD-10-CM | POA: Diagnosis not present

## 2023-07-02 ENCOUNTER — Telehealth: Payer: Self-pay

## 2023-07-02 NOTE — Transitions of Care (Post Inpatient/ED Visit) (Signed)
   07/02/2023  Name: Carol Collins MRN: [id number] DOB: November 02, 1976  Today's TOC FU Call Status: Today's TOC FU Call Status:: Successful TOC FU Call Completed TOC FU Call Complete Date: 07/02/23  Transition Care Management Follow-up Telephone Call Date of Discharge: 07/01/23 Discharge Facility: Other [facility]) Name of Other (Non[facility]) Discharge Facility: [facility] Type of Discharge: Emergency Department Reason for ED Visit: Orthopedic Conditions Orthopedic/Injury Diagnosis:  (Ankle swelling) How have you been since you were released from the hospital?: Better Any questions or concerns?: No  Items Reviewed: Did you receive and understand the discharge instructions provided?: Yes Medications obtained,verified, and reconciled?: Yes (Medications Reviewed) Any new allergies since your discharge?: No Dietary orders reviewed?: Yes Do you have support at home?: Yes  Medications Reviewed Today: Medications Reviewed Today     Reviewed by Merleen Nicely, LPN (Licensed Practical Nurse) on 07/02/23 at 1359  Med List Status: <None>   Medication Order Taking? Sig Documenting Provider Last Dose Status Informant  Accu-Chek FastClix Lancets MISC [id number] Yes Use to check blood sugar up to 2 times daily Smitty Cords, DO Taking Active   ACCU-CHEK GUIDE test strip [id number] Yes Use to check blood sugar up to 2 times daily Smitty Cords, DO Taking Active   Blood Glucose Monitoring Suppl (ACCU-CHEK GUIDE) w/Device KIT [id number] Yes 1 kit by Does not apply route See admin instructions. Use to check blood sugar up to 2 times daily Smitty Cords, DO Taking Active   buPROPion [facility] SR) 150 MG 12 hr tablet [id number] Yes Take 150 mg PO once daily for 3 days then increase to 150 mg PO twice daily. Plan to stop smoking 7 days after starting medication. Jerrol Banana, MD Taking Active   lisinopril (ZESTRIL) 10 MG tablet [id number] Yes Take 1 tablet  (10 mg total) by mouth daily. Jerrol Banana, MD Taking Active   Multiple Vitamins-Minerals (MULTIVITAL PO) [id number] Yes Take by mouth daily. [provider] Taking Active   Semaglutide, 1 MG/DOSE, 4 MG/3ML SOPN [id number] Yes Inject 1 mg as directed once a week. Jerrol Banana, MD Taking Active   triamcinolone (NASACORT ALLERGY 24HR) 55 MCG/ACT AERO nasal inhaler [id number] Yes Place 2 sprays into the nose daily. Jerrol Banana, MD Taking Active             Home Care and Equipment/Supplies: Were Home Health Services Ordered?: NA Any new equipment or medical supplies ordered?: NA  Functional Questionnaire: Do you need assistance with bathing/showering or dressing?: No Do you need assistance with meal preparation?: No Do you need assistance with eating?: No Do you have difficulty maintaining continence: No Do you need assistance with getting out of bed/getting out of a chair/moving?: No Do you have difficulty managing or taking your medications?: No  Follow up appointments reviewed: PCP Follow-up appointment confirmed?: No MD Provider Line Number:[phone] Given: Yes Specialist Hospital Follow-up appointment confirmed?: NA Do you need transportation to your follow-up appointment?: No Do you understand care options if your condition(s) worsen?: Yes-patient verbalized understanding    SIGNATURE  Woodfin Ganja LPN [facility] Nurse Health Advisor Direct Dial [phone]

## 2023-07-03 ENCOUNTER — Encounter: Payer: Self-pay | Admitting: Family Medicine

## 2023-07-03 ENCOUNTER — Ambulatory Visit (INDEPENDENT_AMBULATORY_CARE_PROVIDER_SITE_OTHER): Payer: Medicaid Other | Admitting: Family Medicine

## 2023-07-03 VITALS — BP 122/78 | HR 65 | Ht 62.0 in | Wt 164.0 lb

## 2023-07-03 DIAGNOSIS — L958 Other vasculitis limited to the skin: Secondary | ICD-10-CM | POA: Insufficient documentation

## 2023-07-03 HISTORY — DX: Other vasculitis limited to the skin: L95.8

## 2023-07-03 MED ORDER — TRIAMCINOLONE ACETONIDE 0.025 % EX OINT: 1.0000 | TOPICAL_OINTMENT | Freq: Two times a day (BID) | CUTANEOUS | 0 refills | Status: DC

## 2023-07-03 NOTE — Patient Instructions (Signed)
-   Topical steroid twice daily x 7-14 days - Benadryl for additional symptom control - Contact our office at 1 to 2 weeks if symptoms fail to improve to discuss next steps

## 2023-07-03 NOTE — Progress Notes (Signed)
     Primary Care / Sports Medicine Office Visit  Patient Information:  Patient ID: ZANYLAH SKOV, female DOB: 04-10-1976 Age: 47 y.o. MRN: [id number]   ZEYNEP Collins is a pleasant 47 y.o. female presenting with the following:  Chief Complaint  Patient presents with   Hospitalization Follow-up    Bit by something on left ankle    Vitals:   07/03/23 1620  BP: 122/78  Pulse: 65  SpO2: 97%   Vitals:   07/03/23 1620  Weight: 164 lb (74.4 kg)  Height: 5\' 2"  (1.575 m)   Body mass index is 30 kg/m.  No results found.   Independent interpretation of notes and tests performed by another provider:   None  Procedures performed:   None  Pertinent History, Exam, Impression, and Recommendations:   Carol Collins was seen today for hospitalization follow-up.  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 presents for ER follow-up to left medial ankle pain and swelling following bug bite, concern for "assassin bug" (family Reduviidae), while mowing her lawn on date of injury.  Noted significant erythema and swelling, went to [facility] ER, there after evaluation they focused primarily on pain control and monitoring with advice for close follow-up with [place].  Since that time she has noted significant improvement in the redness, pain, and swelling, does have a focal region along the medial foot as demonstrated in the pictures.  This area is nontender, without warmth, no fluctuance, sensorimotor intact.  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  Plan: - Topical Rx Kenalog twice daily x 7-14 days - Benadryl for additional symptom control - Contact our office at 1 to 2 weeks if symptoms fail to improve to discuss next steps - Can consider Rx antibiotic at that time versus alternate treatments pending clinical picture   Other orders -     Triamcinolone Acetonide; Apply 1 Application topically 2 (two) times daily. X 1-2 weeks  Dispense: 30 g; Refill: 0     Orders & Medications Meds ordered this encounter  Medications   triamcinolone (KENALOG) 0.025 % ointment    Sig: Apply 1 Application topically 2 (two) times daily. X 1-2 weeks    Dispense:  30 g    Refill:  0   No orders of the defined types were placed in this encounter.    No follow-ups on file.     Jerrol Banana, MD, [facility]   Primary Care Sports Medicine Primary Care and Sports Medicine at [facility]

## 2023-07-03 NOTE — Assessment & Plan Note (Signed)
Date of injury: 06/30/2023  Carol Collins presents for ER follow-up to left medial ankle pain and swelling following bug bite, concern for "assassin bug" (family Reduviidae), while mowing her lawn on date of injury.  Noted significant erythema and swelling, went to [facility] ER, there after evaluation they focused primarily on pain control and monitoring with advice for close follow-up with [place].  Since that time she has noted significant improvement in the redness, pain, and swelling, does have a focal region along the medial foot as demonstrated in the pictures.  This area is nontender, without warmth, no fluctuance, sensorimotor intact.  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  Plan: - Topical Rx Kenalog twice daily x 7-14 days - Benadryl for additional symptom control - Contact our office at 1 to 2 weeks if symptoms fail to improve to discuss next steps - Can consider Rx antibiotic at that time versus alternate treatments pending clinical picture

## 2023-07-22 DIAGNOSIS — Z419 Encounter for procedure for purposes other than remedying health state, unspecified: Secondary | ICD-10-CM | POA: Diagnosis not present

## 2023-08-06 ENCOUNTER — Ambulatory Visit: Payer: Medicaid Other | Admitting: Family Medicine

## 2023-08-06 ENCOUNTER — Other Ambulatory Visit: Payer: Self-pay

## 2023-08-06 DIAGNOSIS — M65319 Trigger thumb, unspecified thumb: Secondary | ICD-10-CM

## 2023-08-06 DIAGNOSIS — E1142 Type 2 diabetes mellitus with diabetic polyneuropathy: Secondary | ICD-10-CM

## 2023-08-06 MED ORDER — SEMAGLUTIDE (1 MG/DOSE) 4 MG/3ML ~~LOC~~ SOPN: 1.0000 mg | PEN_INJECTOR | SUBCUTANEOUS | 3 refills | Status: DC

## 2023-08-07 ENCOUNTER — Ambulatory Visit: Payer: Medicaid Other | Admitting: Family Medicine

## 2023-08-21 ENCOUNTER — Ambulatory Visit
Admission: RE | Admit: 2023-08-21 | Discharge: 2023-08-21 | Disposition: A | Discharge status: Home / Self Care | Payer: Medicaid Other | Source: Ambulatory Visit | Attending: Family Medicine | Admitting: Family Medicine

## 2023-08-21 ENCOUNTER — Encounter: Payer: Self-pay | Admitting: Family Medicine

## 2023-08-21 ENCOUNTER — Ambulatory Visit (INDEPENDENT_AMBULATORY_CARE_PROVIDER_SITE_OTHER): Payer: Medicaid Other | Admitting: Family Medicine

## 2023-08-21 ENCOUNTER — Ambulatory Visit
Admission: RE | Admit: 2023-08-21 | Discharge: 2023-08-21 | Disposition: A | Discharge status: Home / Self Care | Payer: Medicaid Other | Attending: Family Medicine | Admitting: Family Medicine

## 2023-08-21 VITALS — BP 128/86 | HR 68 | Ht 62.0 in | Wt 164.0 lb

## 2023-08-21 DIAGNOSIS — J0100 Acute maxillary sinusitis, unspecified: Secondary | ICD-10-CM | POA: Diagnosis not present

## 2023-08-21 DIAGNOSIS — M65319 Trigger thumb, unspecified thumb: Secondary | ICD-10-CM | POA: Insufficient documentation

## 2023-08-21 DIAGNOSIS — M79641 Pain in right hand: Secondary | ICD-10-CM | POA: Diagnosis not present

## 2023-08-21 MED ORDER — AZITHROMYCIN 250 MG PO TABS
ORAL_TABLET | ORAL | 0 refills | Status: AC
Start: 2023-08-21 — End: 2023-08-26

## 2023-08-21 NOTE — Patient Instructions (Addendum)
For thumb - Return for planned trigger finger injection - Please bring thumb spica brace to appointment and plan on at least 2 days of relative rest following the procedure - Can continue Pennsaid twice daily as needed between now and then  For sinuses - Start Nasacort x 7 days - Start Mucinex twice daily x 7 days - If symptoms persist into this weekend, start azithromycin and take for full course while continuing the above medications

## 2023-08-21 NOTE — Progress Notes (Signed)
     Primary Care / Sports Medicine Office Visit  Patient Information:  Patient ID: JYRAH BLYE, female DOB: Jun 02, 1976 Age: 47 y.o. MRN: [id number]   Carol Collins is a pleasant 47 y.o. female presenting with the following:  Chief Complaint  Patient presents with   Stenosing tenosynovitis of thumb    Not improving   Sinusitis    Vitals:   08/21/23 1354  BP: 128/86  Pulse: 68  SpO2: 98%   Vitals:   08/21/23 1354  Weight: 164 lb (74.4 kg)  Height: 5\' 2"  (1.575 m)   Body mass index is 30 kg/m.  No results found.   Independent interpretation of notes and tests performed by another provider:   None  Procedures performed:   None  Pertinent History, Exam, Impression, and Recommendations:   Problem List Items Addressed This Visit       Respiratory   Acute non-recurrent maxillary sinusitis    Patient with 2-3-day history of sinus pressure, dark secretions, ear pressure, denies fevers, chills, dysphagia, shortness of air.  Examination localizes to the maxillary sinuses bilaterally.  We discussed various etiologies and bacterial versus viral infections.  Plan: - Start Nasacort x 7 days - Start Mucinex twice daily x 7 days - If symptoms persist into this weekend, start azithromycin and take for full course while continuing the above medications      Relevant Medications   azithromycin (ZITHROMAX) 250 MG tablet     Musculoskeletal and Integument   Stenosing tenosynovitis of thumb - Primary    Returns for follow-up to right thumb stenosing tenosynovitis, has self discontinued thumb spica brace, persistent symptoms relayed.  Exam again shows focality to the first volar MCP without overt triggering.  We discussed treatment strategies given her clinical course.  Plan: - Return for planned trigger finger injection - Please bring thumb spica brace to appointment and plan on at least 2 days of relative rest following the procedure - Can continue Pennsaid twice daily  as needed between now and then        Orders & Medications Medications:  Meds ordered this encounter  Medications   azithromycin (ZITHROMAX) 250 MG tablet    Sig: Take 2 tablets on day 1, then 1 tablet daily on days 2 through 5    Dispense:  6 tablet    Refill:  0   No orders of the defined types were placed in this encounter.    No follow-ups on file.     Jerrol Banana, MD, [facility]   Primary Care Sports Medicine Primary Care and Sports Medicine at [facility]

## 2023-08-21 NOTE — Assessment & Plan Note (Signed)
Returns for follow-up to right thumb stenosing tenosynovitis, has self discontinued thumb spica brace, persistent symptoms relayed.  Exam again shows focality to the first volar MCP without overt triggering.  We discussed treatment strategies given her clinical course.  Plan: - Return for planned trigger finger injection - Please bring thumb spica brace to appointment and plan on at least 2 days of relative rest following the procedure - Can continue Pennsaid twice daily as needed between now and then

## 2023-08-21 NOTE — Assessment & Plan Note (Signed)
Patient with 2-3-day history of sinus pressure, dark secretions, ear pressure, denies fevers, chills, dysphagia, shortness of air.  Examination localizes to the maxillary sinuses bilaterally.  We discussed various etiologies and bacterial versus viral infections.  Plan: - Start Nasacort x 7 days - Start Mucinex twice daily x 7 days - If symptoms persist into this weekend, start azithromycin and take for full course while continuing the above medications

## 2023-09-04 ENCOUNTER — Ambulatory Visit (INDEPENDENT_AMBULATORY_CARE_PROVIDER_SITE_OTHER): Payer: Medicaid Other | Admitting: Family Medicine

## 2023-09-04 ENCOUNTER — Other Ambulatory Visit (INDEPENDENT_AMBULATORY_CARE_PROVIDER_SITE_OTHER): Payer: Medicaid Other | Admitting: Radiology

## 2023-09-04 ENCOUNTER — Encounter: Payer: Self-pay | Admitting: Family Medicine

## 2023-09-04 VITALS — BP 132/78 | HR 70 | Ht 62.0 in | Wt 168.0 lb

## 2023-09-04 DIAGNOSIS — M65319 Trigger thumb, unspecified thumb: Secondary | ICD-10-CM

## 2023-09-04 DIAGNOSIS — Z720 Tobacco use: Secondary | ICD-10-CM

## 2023-09-04 DIAGNOSIS — M65311 Trigger thumb, right thumb: Secondary | ICD-10-CM

## 2023-09-04 NOTE — Progress Notes (Signed)
     Primary Care / Sports Medicine Office Visit  Patient Information:  Patient ID: SEIRA CODY, female DOB: 1976/10/11 Age: 47 y.o. MRN: [id number]   SHAKIRAH KIRKEY is a pleasant 47 y.o. female presenting with the following:  Chief Complaint  Patient presents with   Wrist Pain    Vitals:   09/04/23 0915  BP: 132/78  Pulse: 70  SpO2: 98%   Vitals:   09/04/23 0915  Weight: 168 lb (76.2 kg)  Height: 5\' 2"  (1.575 m)   Body mass index is 30.73 kg/m.  No results found.   Independent interpretation of notes and tests performed by another provider:   None  Procedures performed:   Procedure:  Injection of right trigger thumb under ultrasound guidance. Ultrasound guidance utilized for out of plane approach to right first flexor tendon, tendon thickening noted without disruption consistent with tendinopathy Samsung HS60 device utilized with permanent recording / reporting. Verbal informed consent obtained and verified. Skin prepped in a sterile fashion. Ethyl chloride for topical local analgesia.  Completed without difficulty and tolerated well. Medication: triamcinolone acetonide 40 mg/mL suspension for injection 1 mL total and 1 mL lidocaine 1% without epinephrine utilized for needle placement anesthetic Advised to contact for fevers/chills, erythema, induration, drainage, or persistent bleeding.   Pertinent History, Exam, Impression, and Recommendations:   Problem List Items Addressed This Visit       Musculoskeletal and Integument   Stenosing tenosynovitis of thumb - Primary    Patient presents for right first digit trigger finger injection, exam again shows focality to the volar first MCP, tender nodule palpated.  Plan: - Patient elected to proceed with ultrasound-guided trigger finger injection of the right first digit - Post care reviewed - After relative rest, will wean from brace and focus on attaining full painless range of motion with home exercises -  Can follow-up as needed - Recalcitrant symptoms can be addressed with consideration of advanced imaging, Occupational Therapy      Relevant Orders   [place] LIMITED JOINT SPACE STRUCTURES UP RIGHT     Other   Tobacco abuse    Patient brings up tobacco cessation, was previously prescribed Wellbutrin, admits to difficulty dosing this on a regular basis.  We had a discussion regarding transition to extended release, restarting prior regimen, or waiting.  Patient has opted to wait until ready to restart smoking cessation and will contact us at that time.  Plan: - Discontinue Wellbutrin for now, can consider extended release at restart - Patient will contact us when ready to restart smoking cessation course        Orders & Medications Medications: No orders of the defined types were placed in this encounter.  Orders Placed This Encounter  Procedures   [place] LIMITED JOINT SPACE STRUCTURES UP RIGHT     No follow-ups on file.     Jerrol Banana, MD, [facility]   Primary Care Sports Medicine Primary Care and Sports Medicine at [facility]

## 2023-09-04 NOTE — Assessment & Plan Note (Signed)
Patient brings up tobacco cessation, was previously prescribed Wellbutrin, admits to difficulty dosing this on a regular basis.  We had a discussion regarding transition to extended release, restarting prior regimen, or waiting.  Patient has opted to wait until ready to restart smoking cessation and will contact us at that time.  Plan: - Discontinue Wellbutrin for now, can consider extended release at restart - Patient will contact us when ready to restart smoking cessation course

## 2023-09-04 NOTE — Patient Instructions (Signed)
You have just been given a cortisone injection to reduce pain and inflammation. After the injection you may notice immediate relief of pain as a result of the Lidocaine. It is important to rest the area of the injection for 24 to 48 hours after the injection. There is a possibility of some temporary increased discomfort and swelling for up to 72 hours until the cortisone begins to work. If you do have pain, simply rest the joint and use ice. If you can tolerate over the counter medications, you can try Tylenol, Aleve, or Advil for added relief per package instructions. - As above, relative rest for 2 days - Continue brace x 2 days and gradually work towards discontinuing the brace over the next 1-2 weeks - Gradual return to normal activity and focus on attaining full range of motion with the thumb - Can contact us when ready to restart Wellbutrin

## 2023-09-04 NOTE — Assessment & Plan Note (Signed)
Patient presents for right first digit trigger finger injection, exam again shows focality to the volar first MCP, tender nodule palpated.  Plan: - Patient elected to proceed with ultrasound-guided trigger finger injection of the right first digit - Post care reviewed - After relative rest, will wean from brace and focus on attaining full painless range of motion with home exercises - Can follow-up as needed - Recalcitrant symptoms can be addressed with consideration of advanced imaging, Occupational Therapy

## 2023-09-19 ENCOUNTER — Other Ambulatory Visit: Payer: Self-pay | Admitting: Family Medicine

## 2023-09-19 ENCOUNTER — Ambulatory Visit (INDEPENDENT_AMBULATORY_CARE_PROVIDER_SITE_OTHER): Payer: Medicaid Other | Admitting: Family Medicine

## 2023-09-19 ENCOUNTER — Encounter: Payer: Self-pay | Admitting: Family Medicine

## 2023-09-19 VITALS — BP 128/82 | HR 73 | Ht 62.0 in | Wt 173.0 lb

## 2023-09-19 DIAGNOSIS — Z0000 Encounter for general adult medical examination without abnormal findings: Secondary | ICD-10-CM | POA: Insufficient documentation

## 2023-09-19 DIAGNOSIS — Z23 Encounter for immunization: Secondary | ICD-10-CM | POA: Diagnosis not present

## 2023-09-19 DIAGNOSIS — Z13220 Encounter for screening for lipoid disorders: Secondary | ICD-10-CM

## 2023-09-19 DIAGNOSIS — M65319 Trigger thumb, unspecified thumb: Secondary | ICD-10-CM

## 2023-09-19 DIAGNOSIS — Z1211 Encounter for screening for malignant neoplasm of colon: Secondary | ICD-10-CM

## 2023-09-19 DIAGNOSIS — E1142 Type 2 diabetes mellitus with diabetic polyneuropathy: Secondary | ICD-10-CM

## 2023-09-19 DIAGNOSIS — Z1159 Encounter for screening for other viral diseases: Secondary | ICD-10-CM

## 2023-09-19 DIAGNOSIS — Z720 Tobacco use: Secondary | ICD-10-CM

## 2023-09-19 DIAGNOSIS — E559 Vitamin D deficiency, unspecified: Secondary | ICD-10-CM | POA: Diagnosis not present

## 2023-09-19 DIAGNOSIS — I10 Essential (primary) hypertension: Secondary | ICD-10-CM | POA: Diagnosis not present

## 2023-09-19 DIAGNOSIS — Z7985 Long-term (current) use of injectable non-insulin antidiabetic drugs: Secondary | ICD-10-CM

## 2023-09-19 NOTE — Patient Instructions (Signed)
-   Obtain fasting labs with orders provided (can have water or black coffee but otherwise no food or drink x 8 hours before labs) - Review information provided - Attend eye doctor annually, dentist every 6 months, work towards or maintain 30 minutes of moderate intensity physical activity at least 5 days per week, and consume a balanced diet - Return in 6 months - Contact us for any questions between now and then

## 2023-09-19 NOTE — Assessment & Plan Note (Signed)
Resolved following recent cortisone injection.  - Encouraged home-based rehab

## 2023-09-19 NOTE — Assessment & Plan Note (Signed)
Annual examination completed, risk stratification labs ordered, anticipatory guidance provided.  We will follow labs once resulted. 

## 2023-09-19 NOTE — Assessment & Plan Note (Signed)
Doing well on current regimen, risk stratification labs ordered.

## 2023-09-19 NOTE — Progress Notes (Signed)
Annual Physical Exam Visit  Patient Information:  Patient ID: Carol Collins, female DOB: [birth year] Age: 47 y.o. MRN: [id number]   Subjective:   CC: Annual Physical Exam  HPI:  Carol Collins is here for their annual physical.  I reviewed the past medical history, family history, social history, surgical history, and allergies today and changes were made as necessary.  Please see the problem list section below for additional details.  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 07/03/2023      Media Information    Document Information             Photos      Left medial foot      07/03/2023 16:42      Attached To:      Office Visit on 07/03/23 with Jerrol Banana, MD     Media Information    Document Information             Photos      Left medial foot      07/03/2023 16:42      Attached To:      Office Visit on 07/03/23 with Jerrol Banana, MD         Past Surgical History: Past Surgical History:  Procedure Laterality Date   ABDOMINAL HYSTERECTOMY  2016   total including cervix removal   CESAREAN SECTION     DIAGNOSTIC LAPAROSCOPY     laproscopy     MYRINGOTOMY WITH TUBE PLACEMENT Bilateral 08/31/2016   Procedure: MYRINGOTOMY WITH TUBE PLACEMENT;  Surgeon: Bud Face, MD;  Location: [facility] ORS;  Service: ENT;  Laterality: Bilateral;   Family History: Family History  Problem Relation Age of Onset   Breast cancer Mother 24   Diabetes Mother    Heart disease Father    Diabetes Father    Hyperlipidemia Father    Hyperlipidemia Sister    Breast cancer Maternal Grandmother 42   Diabetes Maternal Grandfather    Allergies: Allergies  Allergen Reactions   Codeine Other (See Comments)    "hyperactivity"   Latex Hives and Rash   Health Maintenance: Health Maintenance  Topic Date Due   Diabetic  kidney evaluation - Urine ACR  Never done   OPHTHALMOLOGY EXAM  01/15/2019   Diabetic kidney evaluation - eGFR measurement  02/23/2022   Colonoscopy  10/20/2023 (Originally 01/27/2020)   INFLUENZA VACCINE  02/18/2024 (Originally 06/21/2023)   Cervical Cancer Screening (HPV/Pap Cotest)  09/18/2024 (Originally 01/26/2005)   Hepatitis C Screening  09/18/2024 (Originally 01/26/1993)   HEMOGLOBIN A1C  09/27/2023   FOOT EXAM  09/18/2024   DTaP/Tdap/Td (2 - Td or Tdap) 09/18/2033   HIV Screening  Completed   HPV VACCINES  Aged Out   COVID-19 Vaccine  Discontinued    HM Colonoscopy          Postponed - Colonoscopy (Every 10 Years) Postponed until 10/20/2023    No completion history exists for this topic.           Medications: Current Outpatient Medications on File Prior to Visit  Medication Sig Dispense Refill   Accu-Chek FastClix Lancets MISC Use to check blood sugar up to 2 times daily 204 each 3   ACCU-CHEK GUIDE test strip Use to check blood sugar up  to 2 times daily 200 each 3   Blood Glucose Monitoring Suppl (ACCU-CHEK GUIDE) w/Device KIT 1 kit by Does not apply route See admin instructions. Use to check blood sugar up to 2 times daily 1 kit 0   lisinopril (ZESTRIL) 10 MG tablet Take 1 tablet (10 mg total) by mouth daily. 90 tablet 0   Multiple Vitamins-Minerals (MULTIVITAL PO) Take by mouth daily.     Semaglutide, 1 MG/DOSE, 4 MG/3ML SOPN Inject 1 mg as directed once a week. 3 mL 3   triamcinolone (NASACORT ALLERGY 24HR) 55 MCG/ACT AERO nasal inhaler Place 2 sprays into the nose daily. 16.9 mL 11   No current facility-administered medications on file prior to visit.    Objective:   Vitals:   09/19/23 0802  BP: 128/82  Pulse: 73  SpO2: 99%   Vitals:   09/19/23 0802  Weight: 173 lb (78.5 kg)  Height: 5\' 2"  (1.575 m)   Body mass index is 31.64 kg/m.  General: Well Developed, well nourished, and in no acute distress.  Neuro: Alert and oriented x3, extra-ocular muscles  intact, sensation grossly intact. Cranial nerves II through XII are grossly intact, motor, sensory, and coordinative functions are intact. HEENT: Normocephalic, atraumatic, neck supple, no masses, no lymphadenopathy, thyroid nonenlarged. Oropharynx, nasopharynx, external ear canals are unremarkable. Skin: Warm and dry, no rashes noted.  Cardiac: Regular rate and rhythm, no murmurs rubs or gallops. No peripheral edema. Pulses symmetric. Respiratory: Clear to auscultation bilaterally. Speaking in full sentences.  Abdominal: Soft, nontender, nondistended, positive bowel sounds, no masses, no organomegaly. Musculoskeletal: Stable, and with full range of motion.   Impression and Recommendations:   The patient was counselled, risk factors were discussed, and anticipatory guidance given.  Problem List Items Addressed This Visit       Cardiovascular and Mediastinum   Essential hypertension    Doing well on current regimen, risk stratification labs ordered.        Endocrine   DM type 2 with diabetic peripheral neuropathy (HCC)    Last A1c and overall trend reassuring, stable exam findings.   - Updated labs ordered - Referral to optometry for eye exam placed      Relevant Orders   Urine Microalbumin w/creat. ratio   Comprehensive metabolic panel   Hemoglobin A1c   VITAMIN D 25 Hydroxy (Vit-D Deficiency, Fractures)   Vitamin B12   Ambulatory referral to Optometry     Musculoskeletal and Integument   Stenosing tenosynovitis of thumb    Resolved following recent cortisone injection.  - Encouraged home-based rehab        Other   Tobacco abuse    At the contemplative stage, has been prescribed Wellbutrin and will contact us when wanting to start.      Healthcare maintenance - Primary    Annual examination completed, risk stratification labs ordered, anticipatory guidance provided.  We will follow labs once resulted.      Relevant Orders   Urine Microalbumin w/creat. ratio    CBC   Comprehensive metabolic panel   Hemoglobin A1c   Lipid panel   TSH   VITAMIN D 25 Hydroxy (Vit-D Deficiency, Fractures)   Vitamin B12   Other Visit Diagnoses     Screening for lipoid disorders       Relevant Orders   Comprehensive metabolic panel   Lipid panel   Annual physical exam       Relevant Orders   Urine Microalbumin w/creat. ratio   CBC  Comprehensive metabolic panel   Hemoglobin A1c   Lipid panel   TSH   VITAMIN D 25 Hydroxy (Vit-D Deficiency, Fractures)   Vitamin B12   Vitamin D deficiency       Relevant Orders   VITAMIN D 25 Hydroxy (Vit-D Deficiency, Fractures)   Need for hepatitis C screening test       Relevant Orders   Hepatitis C antibody   Colon cancer screening       Relevant Orders   Ambulatory referral to Gastroenterology   Need for diphtheria-tetanus-pertussis (Tdap) vaccine       Relevant Orders   Tdap vaccine greater than or equal to 7yo IM (Completed)        Orders & Medications Medications: No orders of the defined types were placed in this encounter.  Orders Placed This Encounter  Procedures   Tdap vaccine greater than or equal to 7yo IM   Urine Microalbumin w/creat. ratio   CBC   Comprehensive metabolic panel   Hemoglobin A1c   Lipid panel   TSH   VITAMIN D 25 Hydroxy (Vit-D Deficiency, Fractures)   Vitamin B12   Hepatitis C antibody   Ambulatory referral to Gastroenterology   Ambulatory referral to Optometry     Return in about 6 months (around 03/19/2024) for f/u.    Jerrol Banana, MD, [facility]   Primary Care Sports Medicine Primary Care and Sports Medicine at [facility]

## 2023-09-19 NOTE — Telephone Encounter (Signed)
Requested by interface surescripts. Medication discontinued 09/19/23. Requested Prescriptions  Refused Prescriptions Disp Refills   buPROPion (WELLBUTRIN SR) 150 MG 12 hr tablet [Pharmacy Med Name: BUPROPION HCL SR 150 MG TABLET] 180 tablet 0    Sig: TAKE 150 MG BY MOUTH ONCE DAILY FOR 3 DAYS THEN INCREASE TO 150 MG TWICE DAILY. PLAN TO STOP SMOKING 7 DAYS AFTER STARTING MEDICATION.     Psychiatry: Antidepressants - bupropion Failed - 09/19/2023  9:35 AM      Failed - Cr in normal range and within 360 days    Creat  Date Value Ref Range Status  02/23/2021 0.85 0.50 - 1.10 mg/dL Final         Failed - AST in normal range and within 360 days    AST  Date Value Ref Range Status  02/23/2021 12 10 - 35 U/L Final         Failed - ALT in normal range and within 360 days    ALT  Date Value Ref Range Status  02/23/2021 10 6 - 29 U/L Final         Passed - Last BP in normal range    BP Readings from Last 1 Encounters:  09/19/23 128/82         Passed - Valid encounter within last 6 months    Recent Outpatient Visits           Today Healthcare maintenance   [facility] Primary Care & Sports Medicine at [facility], Ocie Bob, MD   2 weeks ago Stenosing tenosynovitis of thumb   [place] Primary Care & Sports Medicine at [facility], Ocie Bob, MD   4 weeks ago Stenosing tenosynovitis of thumb   [place] Primary Care & Sports Medicine at [facility], Ocie Bob, MD   2 months ago 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

## 2023-09-19 NOTE — Assessment & Plan Note (Signed)
At the contemplative stage, has been prescribed Wellbutrin and will contact us when wanting to start.

## 2023-09-19 NOTE — Assessment & Plan Note (Signed)
Last A1c and overall trend reassuring, stable exam findings.   - Updated labs ordered - Referral to optometry for eye exam placed

## 2023-09-20 ENCOUNTER — Other Ambulatory Visit: Payer: Self-pay

## 2023-09-20 ENCOUNTER — Telehealth: Payer: Self-pay

## 2023-09-20 DIAGNOSIS — Z1211 Encounter for screening for malignant neoplasm of colon: Secondary | ICD-10-CM

## 2023-09-20 MED ORDER — NA SULFATE-K SULFATE-MG SULF 17.5-3.13-1.6 GM/177ML PO SOLN
1.0000 | Freq: Once | ORAL | 0 refills | Status: AC
Start: 2023-09-20 — End: 2023-09-20

## 2023-09-20 NOTE — Telephone Encounter (Signed)
Gastroenterology Pre-Procedure Review  Request Date: 10/09/23 Requesting Physician: Dr. Servando Snare  PATIENT REVIEW QUESTIONS: The patient responded to the following health history questions as indicated:    1. Are you having any GI issues? no 2. Do you have a personal history of Polyps? no 3. Do you have a family history of Colon Cancer or Polyps? no 4. Diabetes Mellitus? no 5. Joint replacements in the past 12 months?no 6. Major health problems in the past 3 months?no 7. Any artificial heart valves, MVP, or defibrillator?no    MEDICATIONS & ALLERGIES:    Patient reports the following regarding taking any anticoagulation/antiplatelet therapy:   Plavix, Coumadin, Eliquis, Xarelto, Lovenox, Pradaxa, Brilinta, or Effient? no Aspirin? no  Patient confirms/reports the following medications:  Current Outpatient Medications  Medication Sig Dispense Refill   Na Sulfate-K Sulfate-Mg Sulf 17.5-3.13-1.6 GM/177ML SOLN Take 1 kit by mouth once for 1 dose. 354 mL 0   Accu-Chek FastClix Lancets MISC Use to check blood sugar up to 2 times daily 204 each 3   ACCU-CHEK GUIDE test strip Use to check blood sugar up to 2 times daily 200 each 3   Blood Glucose Monitoring Suppl (ACCU-CHEK GUIDE) w/Device KIT 1 kit by Does not apply route See admin instructions. Use to check blood sugar up to 2 times daily 1 kit 0   lisinopril (ZESTRIL) 10 MG tablet Take 1 tablet (10 mg total) by mouth daily. 90 tablet 0   Multiple Vitamins-Minerals (MULTIVITAL PO) Take by mouth daily.     Semaglutide, 1 MG/DOSE, 4 MG/3ML SOPN Inject 1 mg as directed once a week. 3 mL 3   triamcinolone (NASACORT ALLERGY 24HR) 55 MCG/ACT AERO nasal inhaler Place 2 sprays into the nose daily. 16.9 mL 11   No current facility-administered medications for this visit.    Patient confirms/reports the following allergies:  Allergies  Allergen Reactions   Codeine Other (See Comments)    "hyperactivity"   Latex Hives and Rash    No orders of the  defined types were placed in this encounter.   AUTHORIZATION INFORMATION Primary Insurance: 1D#: Group #:  Secondary Insurance: 1D#: Group #:  SCHEDULE INFORMATION: Date: 10/09/23 Time: Location: [facility]

## 2023-09-20 NOTE — Telephone Encounter (Signed)
Incorrectly noted "4. Diabetes Mellitus? No"  Patient is diabetic.  Takes Semaglutide.  Has been advised to stop 7 days prior to colonoscopy.  Instructions Revised to add stop note for Semaglutide.  Thanks,  [place], [place]

## 2023-09-21 DIAGNOSIS — Z419 Encounter for procedure for purposes other than remedying health state, unspecified: Secondary | ICD-10-CM | POA: Diagnosis not present

## 2023-10-21 DIAGNOSIS — Z419 Encounter for procedure for purposes other than remedying health state, unspecified: Secondary | ICD-10-CM | POA: Diagnosis not present

## 2023-11-21 DIAGNOSIS — Z419 Encounter for procedure for purposes other than remedying health state, unspecified: Secondary | ICD-10-CM | POA: Diagnosis not present

## 2023-11-26 ENCOUNTER — Other Ambulatory Visit: Payer: Self-pay | Admitting: Family Medicine

## 2023-11-26 DIAGNOSIS — E1142 Type 2 diabetes mellitus with diabetic polyneuropathy: Secondary | ICD-10-CM

## 2023-11-27 NOTE — Telephone Encounter (Signed)
 Requested medication (s) are due for refill today: Yes  Requested medication (s) are on the active medication list: Yes  Last refill:  08/06/23  Future visit scheduled: Yes  Notes to clinic:  Unable to refill per protocol due to failed labs, no updated results.      Requested Prescriptions  Pending Prescriptions Disp Refills   OZEMPIC , 1 MG/DOSE, 4 MG/3ML SOPN [Pharmacy Med Name: OZEMPIC  4 MG/3 ML (1 MG/DOSE)]  3    Sig: INJECT 1 MG ONCE A WEEK AS DIRECTED     Endocrinology:  Diabetes - GLP-1 Receptor Agonists - semaglutide  Failed - 11/27/2023  7:26 PM      Failed - HBA1C in normal range and within 180 days    Hemoglobin A1C  Date Value Ref Range Status  03/27/2023 4.6 4.0 - 5.6 % Final  04/22/2014 7.7 (H) 4.2 - 6.3 % Final    Comment:    The American Diabetes Association recommends that a primary goal of therapy should be <7% and that physicians should reevaluate the treatment regimen in patients with HbA1c values consistently >8%.    Hgb A1c MFr Bld  Date Value Ref Range Status  03/15/2022 5.2 4.8 - 5.6 % Final    Comment:             Prediabetes: 5.7 - 6.4          Diabetes: >6.4          Glycemic control for adults with diabetes: <7.0          Failed - Cr in normal range and within 360 days    Creat  Date Value Ref Range Status  02/23/2021 0.85 0.50 - 1.10 mg/dL Final         Passed - Valid encounter within last 6 months    Recent Outpatient Visits           2 months ago Healthcare maintenance   [facility] Primary Care & Sports Medicine at [facility], Selinda PARAS, MD   2 months ago Stenosing tenosynovitis of thumb   [place] Primary Care & Sports Medicine at [facility], Selinda PARAS, MD   3 months ago Stenosing tenosynovitis of thumb    Primary Care & Sports Medicine at [facility], Selinda PARAS, MD   4 months ago 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

## 2023-11-28 NOTE — Telephone Encounter (Signed)
 LOV 09/19/23

## 2023-12-10 DIAGNOSIS — H5213 Myopia, bilateral: Secondary | ICD-10-CM | POA: Diagnosis not present

## 2023-12-10 DIAGNOSIS — E119 Type 2 diabetes mellitus without complications: Secondary | ICD-10-CM | POA: Diagnosis not present

## 2023-12-22 DIAGNOSIS — Z419 Encounter for procedure for purposes other than remedying health state, unspecified: Secondary | ICD-10-CM | POA: Diagnosis not present

## 2024-02-05 ENCOUNTER — Ambulatory Visit: Admission: RE | Admit: 2024-02-05 | Payer: Medicaid Other | Source: Home / Self Care | Admitting: Gastroenterology

## 2024-02-05 ENCOUNTER — Encounter: Admission: RE | Payer: Self-pay | Source: Home / Self Care

## 2024-02-05 SURGERY — COLONOSCOPY WITH PROPOFOL
Anesthesia: General

## 2024-02-06 ENCOUNTER — Other Ambulatory Visit: Payer: Self-pay | Admitting: Family Medicine

## 2024-02-06 ENCOUNTER — Inpatient Hospital Stay: Admit: 2024-02-06

## 2024-02-06 ENCOUNTER — Ambulatory Visit
Admission: RE | Admit: 2024-02-06 | Discharge: 2024-02-06 | Disposition: A | Discharge status: Home / Self Care | Attending: Family Medicine | Admitting: Family Medicine

## 2024-02-06 ENCOUNTER — Encounter: Payer: Self-pay | Admitting: Family Medicine

## 2024-02-06 ENCOUNTER — Ambulatory Visit
Admission: RE | Admit: 2024-02-06 | Discharge: 2024-02-06 | Disposition: A | Discharge status: Home / Self Care | Source: Ambulatory Visit | Attending: Family Medicine

## 2024-02-06 ENCOUNTER — Ambulatory Visit (INDEPENDENT_AMBULATORY_CARE_PROVIDER_SITE_OTHER): Admitting: Family Medicine

## 2024-02-06 VITALS — BP 130/80 | HR 85 | Ht 62.0 in | Wt 172.8 lb

## 2024-02-06 DIAGNOSIS — R1031 Right lower quadrant pain: Secondary | ICD-10-CM | POA: Diagnosis not present

## 2024-02-06 DIAGNOSIS — M545 Low back pain, unspecified: Secondary | ICD-10-CM

## 2024-02-06 DIAGNOSIS — G8929 Other chronic pain: Secondary | ICD-10-CM | POA: Diagnosis not present

## 2024-02-06 DIAGNOSIS — Z1231 Encounter for screening mammogram for malignant neoplasm of breast: Secondary | ICD-10-CM

## 2024-02-06 DIAGNOSIS — Z1211 Encounter for screening for malignant neoplasm of colon: Secondary | ICD-10-CM

## 2024-02-06 MED ORDER — MELOXICAM 15 MG PO TABS
15.0000 mg | ORAL_TABLET | Freq: Every day | ORAL | 0 refills | Status: DC
Start: 2024-02-06 — End: 2024-05-08

## 2024-02-06 NOTE — Assessment & Plan Note (Signed)
 She describes a sensation in her right lower abdomen that feels like 'somebody's poking' This discomfort is intermittent and not associated with meals or bowel movements. She has a history of a C-section and a hysterectomy, but retains her ovaries. There is no tenderness to touch in the area of discomfort. Her bowel habits have been irregular, with alternating episodes of diarrhea and constipation.   ABDOMEN: Abdomen soft, non-distended. Mild tenderness in the left upper quadrant without rebound.  Right lower quadrant nontender, negative McBurney's point, negative Carnett's sign, normoactive bowel sounds.  Abdominal pain Intermittent right lower quadrant pain with differential including gastrointestinal issues or referred pain. Abdominal x-ray planned to assess for contributing factors. - Order abdominal x-ray to assess for stool burden and kidney stones. - Advise to monitor for changes in pain pattern or associated symptoms.  Bowel irregularities Alternating constipation and diarrhea possibly related to semaglutide use. Discussed impact on bowel habits and need for monitoring. - Order abdominal x-ray to assess for stool burden. - Advise to monitor bowel habits and report any significant changes.

## 2024-02-06 NOTE — Patient Instructions (Signed)
 Patient Care Plan  Chronic Midline Low Back Pain  1. Start taking meloxicam 15 mg once daily with food for one week. After one week, take meloxicam as needed. 2. Do not take any other NSAIDs while using meloxicam. 3. Schedule a low back x-ray. 4. Attend chiropractic care sessions as referred. 5. Report your pain status after one week of meloxicam use. 6. If symptoms persist or x-ray results are inconclusive, discuss the need for an MRI with your healthcare provider.  Red Flags: - If pain significantly worsens or new symptoms develop, contact your healthcare provider immediately.  Colon Cancer Screening  1. A Cologuard test has been ordered for colon cancer screening. Follow instructions provided for sample collection.  Right Lower Quadrant Pain  1. Monitor your pain for any changes in pattern or associated symptoms. 2. An abdominal x-ray has been ordered to check for stool burden and kidney stones.  Bowel Irregularities  1. Monitor bowel habits and report any significant changes to your healthcare provider. 2. An abdominal x-ray has been ordered to assess stool burden.  Please follow these steps and contact your healthcare provider if you have any questions or concerns regarding your treatment plan.

## 2024-02-06 NOTE — Progress Notes (Signed)
 Primary Care / Sports Medicine Office Visit  Patient Information:  Patient ID: NALEAH KOFOED, female DOB: 1976-11-15 Age: 48 y.o. MRN: [id number]   ZANIA KALISZ is a pleasant 48 y.o. female presenting with the following:  Chief Complaint  Patient presents with   Diabetes    Patient presents today for a follow up on her diabetes. She has been taking Ozempic weekly.She is not having any side effects and doing well on this medication.     Vitals:   02/06/24 0803  BP: 130/80  Pulse: 85  SpO2: 98%   Vitals:   02/06/24 0803  Weight: 172 lb 12.8 oz (78.4 kg)  Height: 5\' 2"  (1.575 m)   Body mass index is 31.61 kg/m.  No results found.   Independent interpretation of notes and tests performed by another provider:   None  Procedures performed:   None  Pertinent History, Exam, Impression, and Recommendations:   Problem List Items Addressed This Visit     Chronic midline low back pain without sciatica - Primary   She experiences low back pain that has recurred over the past few months. The pain is located in the middle of her lower back, without radiation to the buttocks or legs. It occurs randomly, often when walking, and lasts for a second or two. There is no specific injury or activity that triggers the pain. She does not experience pain while sitting in a car for long periods or while lying in bed, although she does not lie flat due to discomfort. Occasionally, she experiences cramps after long car rides. She has not been taking any consistent medication for the pain, but it occurs often enough to be concerning.  Physical Exam PALPATION: Non-tender SI joints, non-tender midline, nontender paraspinals. SPECIAL TESTS: Negative straight leg raise, piriformis, Pearlean Brownie, Fader, and Chems tests bilaterally.  Chronic pain with acute exacerbation Chronic low back pain likely due to musculoskeletal strain or degenerative changes. Negative tests for radicular symptoms and none  stated per history. Explained meloxicam use and precautions. - Order low back x-ray. - Refer to chiropractic care. - Prescribe meloxicam 15 mg once daily with food for one week as a trial.  After 1 week dose meloxicam daily on an as-needed basis. - Advise no other NSAIDs while on meloxicam. - Instruct to report back in one week with pain status. - Discuss potential need for MRI if symptoms persist or x-ray is inconclusive.      Relevant Medications   meloxicam (MOBIC) 15 MG tablet   Other Relevant Orders   Ambulatory referral to Chiropractic   DG Lumbar Spine Complete   Colon cancer screening   Patient reports being unable to tolerate bowel prep prior to screening colonoscopy.  Is still amenable to pursuing colon cancer screening.  -Cologuard order placed today.      Relevant Orders   Cologuard   Right lower quadrant pain   She describes a sensation in her right lower abdomen that feels like 'somebody's poking' This discomfort is intermittent and not associated with meals or bowel movements. She has a history of a C-section and a hysterectomy, but retains her ovaries. There is no tenderness to touch in the area of discomfort. Her bowel habits have been irregular, with alternating episodes of diarrhea and constipation.   ABDOMEN: Abdomen soft, non-distended. Mild tenderness in the left upper quadrant without rebound.  Right lower quadrant nontender, negative McBurney's point, negative Carnett's sign, normoactive bowel sounds.  Abdominal pain Intermittent right  lower quadrant pain with differential including gastrointestinal issues or referred pain. Abdominal x-ray planned to assess for contributing factors. - Order abdominal x-ray to assess for stool burden and kidney stones. - Advise to monitor for changes in pain pattern or associated symptoms.  Bowel irregularities Alternating constipation and diarrhea possibly related to semaglutide use. Discussed impact on bowel habits and need  for monitoring. - Order abdominal x-ray to assess for stool burden. - Advise to monitor bowel habits and report any significant changes.       Relevant Orders   DG Abd 1 View     Orders & Medications Medications:  Meds ordered this encounter  Medications   meloxicam (MOBIC) 15 MG tablet    Sig: Take 1 tablet (15 mg total) by mouth daily. X 1 week then daily as needed for pain, take with food    Dispense:  30 tablet    Refill:  0   Orders Placed This Encounter  Procedures   DG Lumbar Spine Complete   DG Abd 1 View   Cologuard   Ambulatory referral to Chiropractic     Return in about 7 months (around 09/20/2024) for CPE.     Jerrol Banana, MD, [facility]   Primary Care Sports Medicine Primary Care and Sports Medicine at [facility]

## 2024-02-06 NOTE — Assessment & Plan Note (Signed)
 Patient reports being unable to tolerate bowel prep prior to screening colonoscopy.  Is still amenable to pursuing colon cancer screening.  -Cologuard order placed today.

## 2024-02-06 NOTE — Assessment & Plan Note (Signed)
 She experiences low back pain that has recurred over the past few months. The pain is located in the middle of her lower back, without radiation to the buttocks or legs. It occurs randomly, often when walking, and lasts for a second or two. There is no specific injury or activity that triggers the pain. She does not experience pain while sitting in a car for long periods or while lying in bed, although she does not lie flat due to discomfort. Occasionally, she experiences cramps after long car rides. She has not been taking any consistent medication for the pain, but it occurs often enough to be concerning.  Physical Exam PALPATION: Non-tender SI joints, non-tender midline, nontender paraspinals. SPECIAL TESTS: Negative straight leg raise, piriformis, Pearlean Brownie, Fader, and Chems tests bilaterally.  Chronic pain with acute exacerbation Chronic low back pain likely due to musculoskeletal strain or degenerative changes. Negative tests for radicular symptoms and none stated per history. Explained meloxicam use and precautions. - Order low back x-ray. - Refer to chiropractic care. - Prescribe meloxicam 15 mg once daily with food for one week as a trial.  After 1 week dose meloxicam daily on an as-needed basis. - Advise no other NSAIDs while on meloxicam. - Instruct to report back in one week with pain status. - Discuss potential need for MRI if symptoms persist or x-ray is inconclusive.

## 2024-02-07 ENCOUNTER — Ambulatory Visit
Admission: RE | Admit: 2024-02-07 | Discharge: 2024-02-07 | Disposition: A | Discharge status: Home / Self Care | Source: Ambulatory Visit | Attending: Family Medicine | Admitting: Family Medicine

## 2024-02-07 DIAGNOSIS — Z1231 Encounter for screening mammogram for malignant neoplasm of breast: Secondary | ICD-10-CM | POA: Diagnosis present

## 2024-02-07 LAB — HEMOGLOBIN A1C
Est. average glucose Bld gHb Est-mCnc: 100 mg/dL
Hgb A1c MFr Bld: 5.1 % (ref 4.8–5.6)

## 2024-02-07 LAB — COMPREHENSIVE METABOLIC PANEL
ALT: 8 IU/L (ref 0–32)
AST: 15 IU/L (ref 0–40)
Albumin: 4.2 g/dL (ref 3.9–4.9)
Alkaline Phosphatase: 68 IU/L (ref 44–121)
BUN/Creatinine Ratio: 17 (ref 9–23)
BUN: 15 mg/dL (ref 6–24)
Bilirubin Total: 0.9 mg/dL (ref 0.0–1.2)
CO2: 23 mmol/L (ref 20–29)
Calcium: 9.1 mg/dL (ref 8.7–10.2)
Chloride: 105 mmol/L (ref 96–106)
Creatinine, Ser: 0.9 mg/dL (ref 0.57–1.00)
Globulin, Total: 2.2 g/dL (ref 1.5–4.5)
Glucose: 111 mg/dL — ABNORMAL HIGH (ref 70–99)
Potassium: 4.5 mmol/L (ref 3.5–5.2)
Sodium: 142 mmol/L (ref 134–144)
Total Protein: 6.4 g/dL (ref 6.0–8.5)
eGFR: 78 mL/min/{1.73_m2} (ref >59)

## 2024-02-07 LAB — CBC
Hematocrit: 43.1 % (ref 34.0–46.6)
Hemoglobin: 15 g/dL (ref 11.1–15.9)
MCH: 33 pg (ref 26.6–33.0)
MCHC: 34.8 g/dL (ref 31.5–35.7)
MCV: 95 fL (ref 79–97)
Platelets: 149 10*3/uL — ABNORMAL LOW (ref 150–450)
RBC: 4.55 x10E6/uL (ref 3.77–5.28)
RDW: 12.8 % (ref 11.7–15.4)
WBC: 6.7 10*3/uL (ref 3.4–10.8)

## 2024-02-07 LAB — TSH: TSH: 0.334 u[IU]/mL — ABNORMAL LOW (ref 0.450–4.500)

## 2024-02-07 LAB — MICROALBUMIN / CREATININE URINE RATIO
Creatinine, Urine: 114.7 mg/dL
Microalb/Creat Ratio: <3 mg/g{creat} (ref 0–29)
Microalbumin, Urine: <3 ug/mL

## 2024-02-07 LAB — LIPID PANEL
Chol/HDL Ratio: 3.5 ratio (ref 0.0–4.4)
Cholesterol, Total: 150 mg/dL (ref 100–199)
HDL: 43 mg/dL (ref >39)
LDL Chol Calc (NIH): 83 mg/dL (ref 0–99)
Triglycerides: 134 mg/dL (ref 0–149)
VLDL Cholesterol Cal: 24 mg/dL (ref 5–40)

## 2024-02-07 LAB — VITAMIN D 25 HYDROXY (VIT D DEFICIENCY, FRACTURES): Vit D, 25-Hydroxy: 21.8 ng/mL — ABNORMAL LOW (ref 30.0–100.0)

## 2024-02-07 LAB — HEPATITIS C ANTIBODY: Hep C Virus Ab: NONREACTIVE

## 2024-02-07 LAB — VITAMIN B12: Vitamin B-12: 334 pg/mL (ref 232–1245)

## 2024-02-11 ENCOUNTER — Other Ambulatory Visit: Payer: Self-pay | Admitting: Family Medicine

## 2024-02-11 DIAGNOSIS — R928 Other abnormal and inconclusive findings on diagnostic imaging of breast: Secondary | ICD-10-CM

## 2024-02-14 ENCOUNTER — Encounter: Payer: Self-pay | Admitting: Family Medicine

## 2024-02-14 ENCOUNTER — Other Ambulatory Visit: Payer: Self-pay | Admitting: Family Medicine

## 2024-02-14 DIAGNOSIS — E559 Vitamin D deficiency, unspecified: Secondary | ICD-10-CM

## 2024-02-14 MED ORDER — VITAMIN D (ERGOCALCIFEROL) 1.25 MG (50000 UNIT) PO CAPS: 50000.0000 [IU] | ORAL_CAPSULE | ORAL | 0 refills | Status: DC

## 2024-02-15 ENCOUNTER — Ambulatory Visit
Admission: RE | Admit: 2024-02-15 | Discharge: 2024-02-15 | Disposition: A | Discharge status: Home / Self Care | Source: Ambulatory Visit | Attending: Family Medicine | Admitting: Family Medicine

## 2024-02-15 DIAGNOSIS — R928 Other abnormal and inconclusive findings on diagnostic imaging of breast: Secondary | ICD-10-CM | POA: Diagnosis present

## 2024-03-01 DIAGNOSIS — Z419 Encounter for procedure for purposes other than remedying health state, unspecified: Secondary | ICD-10-CM | POA: Diagnosis not present

## 2024-03-04 ENCOUNTER — Ambulatory Visit
Admission: RE | Admit: 2024-03-04 | Discharge: 2024-03-04 | Discharge status: Against Medical Advice | Payer: Self-pay | Source: Ambulatory Visit | Attending: Family Medicine | Admitting: Family Medicine

## 2024-03-04 ENCOUNTER — Ambulatory Visit: Payer: Self-pay | Admitting: Family Medicine

## 2024-03-04 VITALS — BP 158/84 | HR 65 | Temp 98.7°F | Resp 16

## 2024-03-04 DIAGNOSIS — K047 Periapical abscess without sinus: Secondary | ICD-10-CM

## 2024-03-04 MED ORDER — KETOROLAC TROMETHAMINE 30 MG/ML IJ SOLN
30.0000 mg | Freq: Once | INTRAMUSCULAR | Status: AC
  Administered 2024-03-04: 30 mg via INTRAMUSCULAR

## 2024-03-04 NOTE — ED Triage Notes (Signed)
 Patient presents to [facility] for dental pain, swelling to jaw x 3 days. Treating pain with tylenol and ibuprofen with minimal relief. Last dose of tylenol at 1600. Ibuprofen at 1100. Has an appt tomorrow but states unable to tolerate the pain. Currently taking Augmentin for sinus infection.

## 2024-03-04 NOTE — ED Provider Notes (Signed)
 [facility]    CSN: [id number] Arrival date & time: 03/04/24  1715      History   Chief Complaint Chief Complaint  Patient presents with   Dental Problem    Severe dental pain, swelling in the jaw, over the counter pain meds have been of minimal help. - Entered by patient    HPI Carol Collins is a 48 y.o. female presents for dental pain.  Patient reports 3 days of a right lower tooth pain with some mild facial swelling.  Denies any fevers or chills.  States she is already on Augmentin for sinus infection and has 5 days remaining.  She has been taking Tylenol and ibuprofen with minimal improvement.  She states she wants someone to "cut into it to relieve the pressure". She has an appointment with a dentist tomorrow.  No other concerns at this time.  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      Media Information    Document Information             Photos      Left medial foot      07/03/2023 16:42      Attached To:      Office Visit on 07/03/23 with Ma Saupe, MD     Media Information    Document Information             Photos      Left medial foot      07/03/2023 16:42      Attached To:      Office Visit on 07/03/23 with Ma Saupe, MD          Patient Active Problem List   Diagnosis Date Noted   Colon cancer screening 02/06/2024   Right lower quadrant pain 02/06/2024   Chronic midline low back pain without sciatica 02/06/2024   Healthcare maintenance 09/19/2023   Stenosing tenosynovitis of thumb 06/26/2023   Family history of breast cancer 08/29/2022   Hirsutism 03/01/2022   Rosacea 11/02/2021   Enlarged and hypertrophic nails 08/30/2021   Excessive sleepiness 05/30/2021   Low serum vitamin D 05/30/2021   Essential hypertension 05/30/2021   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   Morbid  obesity (HCC) 04/22/2019   Hidradenitis suppurativa 05/23/2017   Dizziness 05/04/2016   Iron deficiency anemia 10/08/2015   DM type 2 with diabetic peripheral neuropathy (HCC) 10/08/2015   Hyperesthesia 10/08/2015   Allergic rhinitis 10/08/2015   Tobacco abuse 10/08/2015    Past Surgical History:  Procedure Laterality Date   ABDOMINAL HYSTERECTOMY  2016   total including cervix removal   CESAREAN SECTION     DIAGNOSTIC LAPAROSCOPY     laproscopy     MYRINGOTOMY WITH TUBE PLACEMENT Bilateral 08/31/2016   Procedure: MYRINGOTOMY WITH TUBE PLACEMENT;  Surgeon: Rogers Clayman, MD;  Location: [facility] ORS;  Service: ENT;  Laterality: Bilateral;    OB History     Gravida  4   Para  4   Term  4   Preterm      AB      Living  4      SAB      IAB      Ectopic      Multiple      Live  Births  4            Home Medications    Prior to Admission medications   Medication Sig Start Date End Date Taking? Authorizing Provider  Accu-Chek FastClix Lancets MISC Use to check blood sugar up to 2 times daily 04/23/19   Althea Charon, Netta Neat, DO  ACCU-CHEK GUIDE test strip Use to check blood sugar up to 2 times daily 04/23/19   Smitty Cords, DO  Blood Glucose Monitoring Suppl (ACCU-CHEK GUIDE) w/Device KIT 1 kit by Does not apply route See admin instructions. Use to check blood sugar up to 2 times daily 04/23/19   Smitty Cords, DO  lisinopril (ZESTRIL) 10 MG tablet Take 1 tablet (10 mg total) by mouth daily. 09/14/22   Jerrol Banana, MD  meloxicam (MOBIC) 15 MG tablet Take 1 tablet (15 mg total) by mouth daily. X 1 week then daily as needed for pain, take with food 02/06/24   Jerrol Banana, MD  Multiple Vitamins-Minerals (MULTIVITAL PO) Take by mouth daily.    [provider]  Semaglutide, 1 MG/DOSE, (OZEMPIC, 1 MG/DOSE,) 4 MG/3ML SOPN INJECT 1 MG ONCE A WEEK AS DIRECTED 12/03/23   Jerrol Banana, MD  triamcinolone (NASACORT ALLERGY 24HR) 55  MCG/ACT AERO nasal inhaler Place 2 sprays into the nose daily. 03/27/23   Jerrol Banana, MD  Vitamin D, Ergocalciferol, (DRISDOL) 1.25 MG (50000 UNIT) CAPS capsule Take 1 capsule (50,000 Units total) by mouth every 7 (seven) days. Take for 8 total doses(weeks) 02/14/24   Jerrol Banana, MD    Family History Family History  Problem Relation Age of Onset   Breast cancer Mother 74   Diabetes Mother    Heart disease Father    Diabetes Father    Hyperlipidemia Father    Hyperlipidemia Sister    Breast cancer Maternal Grandmother 32   Diabetes Maternal Grandfather     Social History Social History   Tobacco Use   Smoking status: Every Day    Current packs/day: 1.00    Average packs/day: 1 pack/day for 22.0 years (22.0 ttl pk-yrs)    Types: Cigarettes   Smokeless tobacco: Never  Vaping Use   Vaping status: Some Days   Substances: THC  Substance Use Topics   Alcohol use: Not Currently   Drug use: Yes    Types: Marijuana     Allergies   Codeine and Latex   Review of Systems Review of Systems  HENT:  Positive for dental problem.      Physical Exam Triage Vital Signs ED Triage Vitals [03/04/24 1739]  Encounter Vitals Group     BP (!) 171/85     Systolic BP Percentile      Diastolic BP Percentile      Pulse Rate 65     Resp 16     Temp 98.7 F (37.1 C)     Temp Source Oral     SpO2 96 %     Weight      Height      Head Circumference      Peak Flow      Pain Score 6     Pain Loc      Pain Education      Exclude from Growth Chart    No data found.  Updated Vital Signs BP (!) 158/84 (BP Location: Left Arm)   Pulse 65   Temp 98.7 F (37.1 C) (Oral)   Resp 16  LMP 11/20/2013 Comment: hysterectomy  SpO2 96%   Visual Acuity Right Eye Distance:   Left Eye Distance:   Bilateral Distance:    Right Eye Near:   Left Eye Near:    Bilateral Near:     Physical Exam Vitals and nursing note reviewed.  Constitutional:      General: She is not in acute  distress.    Appearance: Normal appearance. She is not ill-appearing.  HENT:     Head: Normocephalic and atraumatic.     Mouth/Throat:     Lips: Pink.     Mouth: Mucous membranes are moist.     Dentition: Dental tenderness and dental caries present.     Pharynx: Oropharynx is clear. Uvula midline. No pharyngeal swelling.      Comments: There is mild facial swelling to the right lower jaw. There is TTP to the right lower tooth #44. No visible or palpable abscess Eyes:     Pupils: Pupils are equal, round, and reactive to light.  Cardiovascular:     Rate and Rhythm: Normal rate.  Pulmonary:     Effort: Pulmonary effort is normal.  Skin:    General: Skin is warm and dry.  Neurological:     General: No focal deficit present.     Mental Status: She is alert and oriented to person, place, and time.  Psychiatric:        Mood and Affect: Mood normal.        Behavior: Behavior normal.      UC Treatments / Results  Labs (all labs ordered are listed, but only abnormal results are displayed) Labs Reviewed - No data to display  Comprehensive metabolic panel Order: [id number]  Status: Final result     Next appt: 06/23/2024 at 09:30 AM in Dermatology Elie Goody, MD)     Dx: Annual physical exam   Test Result Released: Yes (seen)     Messages: Seen   0 Result Notes     1 Patient Communication          Component Ref Range & Units (hover) 6 mo ago 1 yr ago 2 yr ago 3 yr ago 4 yr ago 5 yr ago 6 yr ago  Glucose 89 95 86 R 86 R 82 R 89 R 92 R, CM  BUN 15 15 10 17 12 13 17  R  Creatinine, Ser 0.71 0.75 0.78 0.71 0.74 0.75 0.77 R, CM  eGFR 98 92 89      BUN/Creatinine Ratio 21 20 13 24  High  16 17 NOT APPLICABLE R  Sodium 143 142 140 142 142 142 140 R  Potassium 5.3 High  4.6 4.4 4.3 4.0 4.2 4.8 R  Chloride 105 104 104 104 104 103 104 R  CO2 24 22 23 24 23 23 28  R  Calcium 9.7 9.4 9.0 9.0 8.9 9.1 9.1 R  Total Protein 7.2 6.9 7.0 6.8 6.7 6.8 6.9 R  Albumin 4.6 4.7 4.4 4.3  4.3 4.2 R   Globulin, Total 2.6 2.2 2.6 2.5 2.4 2.6   Bilirubin Total 0.4 0.5 0.3 0.3 0.3 0.4 0.5 R  Alkaline Phosphatase 97 100 115 94 R 76 R 84 R   AST 20 20 20 16 17 17 16  R  ALT 13 15 22 10 10 14 11  R  Resulting Agency [facility]         Narrative Performed by: Verdell Carmine Performed at:  [facility] - [facility] [address]  [place], [place]  [id number] Lab Director: Pearlean Botts MD, Phone:  [phone]       View All Conversations on this Encounter    CM=Additional comments  R=Reference range differs from most recent result in table      EKG   Radiology No results found.  Procedures Procedures (including critical care time)  Medications Ordered in UC Medications  ketorolac (TORADOL) 30 MG/ML injection 30 mg (30 mg Intramuscular Given 03/04/24 1810)    Initial Impression / Assessment and Plan / UC Course  I have reviewed the triage vital signs and the nursing notes.  Pertinent labs & imaging results that were available during my care of the patient were reviewed by me and considered in my medical decision making (see chart for details).     Reviewed exam and symptoms with patient.  Discussed limitations and abilities of urgent care.  Advised that her dentist will need to address her symptoms tomorrow and that we cannot simply cut into her face or jaw.  She is already on Augmentin for sinus infection which will cover dental infection as well.  She was advised to continue this.  She was given Toradol injection in clinic for pain.  She was instructed no NSAIDs for 24 hours and she verbalized understanding.  Patient left clinic after Toradol injection without notifying myself or staff.   Final Clinical Impressions(s) / UC Diagnoses   Final diagnoses:  Dental infection     Discharge Instructions      You were given a Toradol injection in clinic today. Do not take any over the counter NSAID's such as Advil, ibuprofen,  Aleve, or naproxen for 24 hours. You may take tylenol if needed.  You may use over-the-counter dental treatment such as Orajel as needed.  Follow-up with your dentist at your scheduled appointment tomorrow.  Please go to the ER for any worsening symptoms that occur prior to seeing her dentist, this includes but is not limited to fever, worsening pain or facial swelling, or any new concerns that arise.  Hope you feel better soon!      ED Prescriptions   None    PDMP not reviewed this encounter.   Alleen Arbour, NP 03/04/24 [phone]

## 2024-03-04 NOTE — ED Notes (Signed)
 Pt walked out of clinic before finishing visit.

## 2024-03-04 NOTE — Discharge Instructions (Addendum)
 You were given a Toradol injection in clinic today. Do not take any over the counter NSAID's such as Advil, ibuprofen, Aleve, or naproxen for 24 hours. You may take tylenol if needed.  You may use over-the-counter dental treatment such as Orajel as needed.  Follow-up with your dentist at your scheduled appointment tomorrow.  Please go to the ER for any worsening symptoms that occur prior to seeing her dentist, this includes but is not limited to fever, worsening pain or facial swelling, or any new concerns that arise.  Hope you feel better soon!

## 2024-03-31 DIAGNOSIS — Z419 Encounter for procedure for purposes other than remedying health state, unspecified: Secondary | ICD-10-CM | POA: Diagnosis not present

## 2024-04-20 ENCOUNTER — Other Ambulatory Visit: Payer: Self-pay | Admitting: Family Medicine

## 2024-04-20 DIAGNOSIS — E1142 Type 2 diabetes mellitus with diabetic polyneuropathy: Secondary | ICD-10-CM

## 2024-04-21 NOTE — Telephone Encounter (Signed)
 Requested Prescriptions  Pending Prescriptions Disp Refills   OZEMPIC , 1 MG/DOSE, 4 MG/3ML SOPN [Pharmacy Med Name: OZEMPIC  4 MG/3 ML (1 MG/DOSE)]  3    Sig: INJECT 1 MG ONCE A WEEK AS DIRECTED     Endocrinology:  Diabetes - GLP-1 Receptor Agonists - semaglutide  Passed - 04/21/2024  4:39 PM      Passed - HBA1C in normal range and within 180 days    Hemoglobin A1C  Date Value Ref Range Status  04/22/2014 7.7 (H) 4.2 - 6.3 % Final    Comment:    The American Diabetes Association recommends that a primary goal of therapy should be <7% and that physicians should reevaluate the treatment regimen in patients with HbA1c values consistently >8%.    Hgb A1c MFr Bld  Date Value Ref Range Status  02/06/2024 5.1 4.8 - 5.6 % Final    Comment:             Prediabetes: 5.7 - 6.4          Diabetes: >6.4          Glycemic control for adults with diabetes: <7.0          Passed - Cr in normal range and within 360 days    Creat  Date Value Ref Range Status  02/23/2021 0.85 0.50 - 1.10 mg/dL Final   Creatinine, Ser  Date Value Ref Range Status  02/06/2024 0.90 0.57 - 1.00 mg/dL Final         Passed - Valid encounter within last 6 months    Recent Outpatient Visits           2 months ago Chronic midline low back pain without sciatica    Primary Care & Sports Medicine at [facility], Dessie Flow, MD       Future Appointments             In 4 months Augustus Ledger, Dessie Flow, MD [facility] Primary Care & Sports Medicine at [facility], [facility]

## 2024-05-01 DIAGNOSIS — Z419 Encounter for procedure for purposes other than remedying health state, unspecified: Secondary | ICD-10-CM | POA: Diagnosis not present

## 2024-05-08 ENCOUNTER — Encounter: Payer: Self-pay | Admitting: Family Medicine

## 2024-05-08 ENCOUNTER — Ambulatory Visit (INDEPENDENT_AMBULATORY_CARE_PROVIDER_SITE_OTHER): Admitting: Family Medicine

## 2024-05-08 VITALS — BP 128/90 | HR 77 | Ht 62.0 in | Wt 178.0 lb

## 2024-05-08 DIAGNOSIS — M65319 Trigger thumb, unspecified thumb: Secondary | ICD-10-CM

## 2024-05-08 DIAGNOSIS — M2550 Pain in unspecified joint: Secondary | ICD-10-CM | POA: Diagnosis not present

## 2024-05-08 DIAGNOSIS — Z720 Tobacco use: Secondary | ICD-10-CM

## 2024-05-08 DIAGNOSIS — Z7985 Long-term (current) use of injectable non-insulin antidiabetic drugs: Secondary | ICD-10-CM | POA: Diagnosis not present

## 2024-05-08 DIAGNOSIS — E1142 Type 2 diabetes mellitus with diabetic polyneuropathy: Secondary | ICD-10-CM | POA: Diagnosis not present

## 2024-05-08 DIAGNOSIS — N951 Menopausal and female climacteric states: Secondary | ICD-10-CM | POA: Insufficient documentation

## 2024-05-08 MED ORDER — PAROXETINE MESYLATE 7.5 MG PO CAPS: 1.0000 | ORAL_CAPSULE | Freq: Every day | ORAL | 0 refills | Status: DC

## 2024-05-08 MED ORDER — TRAZODONE HCL 50 MG PO TABS: 25.0000 mg | ORAL_TABLET | Freq: Every evening | ORAL | 2 refills | Status: DC | PRN

## 2024-05-08 MED ORDER — BUPROPION HCL ER (SR) 150 MG PO TB12: ORAL_TABLET | ORAL | 0 refills | Status: DC

## 2024-05-08 NOTE — Assessment & Plan Note (Signed)
 Possible autoimmune condition Possible autoimmune condition suggested by morning stiffness and family history of arthritis. Early detection crucial to prevent joint damage. - Order autoimmune panel to investigate potential autoimmune conditions. - Discuss possibility of referral to rheumatology if autoimmune markers are positive.

## 2024-05-08 NOTE — Assessment & Plan Note (Signed)
 She experiences night sweats that significantly disrupt her sleep, causing her to wake up drenched and uncomfortable, leading to only three to four hours of sleep per night. These are not accompanied by typical hot flashes, but rather a slight increase in warmth that resolves quickly.  She also experiences other menopause-related symptoms such as mastalgia, weight gain, skin changes, changes in body odor, loss of interest, bladder weakness, cognitive difficulties, mood changes, and concentration difficulties.   Vasomotor menopausal symptoms Menopausal symptoms likely due to hormonal changes, she is status post-hysterectomy. HReferral to gynecology for hormone evaluation at management. Paroxetine prescribed for interim vasomotor symptoms. - Refer to gynecology for hormone management. - Prescribe paroxetine 10 mg once daily for vasomotor symptoms. - Discuss potential side effects of paroxetine, including 4-6 weeks for full effect. - Discuss potential use of estrogen patches post-gynecology consultation.  Sleep disturbance due to night sweats Severe sleep disturbance due to night sweats, part of vasomotor symptoms. Immediate management with medication necessary to improve sleep quality. Trazodone prescribed as temporary measure until paroxetine takes effect. - Prescribe trazodone 50 mg as needed for sleep, starting with half a tablet (25 mg).  Can take a maximum of 2 tablets (100 mg) nightly as needed. - Advise to use trazodone only when necessary, not exceeding two tablets.

## 2024-05-08 NOTE — Patient Instructions (Signed)
 Patient Action Plan  1. Vasomotor Menopausal Symptoms:    - Take paroxetine 10 mg once daily for managing vasomotor symptoms.    - Schedule an appointment with gynecology for hormone management.    - Discuss potential use of estrogen patches with your gynecologist.    - Be aware that paroxetine may take 4-6 weeks to reach full effect.  2. Sleep Disturbance Due to Night Sweats:    - Take trazodone 50 mg as needed for sleep. Start with half a tablet (25 mg) and can take up to two tablets (100 mg) nightly if necessary.    - Use trazodone only when needed and do not exceed two tablets per night.  3. Polyarthralgia and Possible Autoimmune Condition:    - Complete the ordered autoimmune panel blood test to check for autoimmune conditions.    - Consider a referral to a rheumatologist if autoimmune markers are positive.  4. Stenosing Tenosynovitis of Right Thumb:    - Use over-the-counter Voltaren gel (diclofenac) topically up to four times a day for thumb pain.    - Explore occupational therapy options if symptoms persist.    - Complete the autoimmune panel to rule out related conditions.  5. Smoking Cessation:    - Start bupropion , taking one tablet daily for three days, then increase to two tablets daily.    - Set a quit date approximately seven days after starting bupropion .    - Follow the medication regimen closely for best results.  Red Flags: - Contact your healthcare provider if you experience severe side effects from medications, such as allergic reactions or significant mood changes. - If you notice worsening joint pain or swelling, especially if it does not improve with treatment, seek medical attention.

## 2024-05-08 NOTE — Assessment & Plan Note (Signed)
 In addition to menopause symptoms, she experiences joint issues, particularly in her thumb and right PIP aspect of middle finger. She describes morning stiffness and swelling in her middle finger that lasts two to three hours, which she massages to alleviate. She has a history of receiving injections for her thumb pain, which provided relief for about three months.  Stenosing  tenosynovitis of right thumb Acute on chronic right thumb pain and stiffness. Consideration of alternative treatments due to cost and insurance coverage.  - Recommend over-the-counter Voltaren gel (diclofenac) for topical use up to four times a day. - Discuss potential for occupational therapy if symptoms persist. - Order autoimmune panel to rule out autoimmune conditions.

## 2024-05-08 NOTE — Progress Notes (Signed)
 Primary Care / Sports Medicine Office Visit  Patient Information:  Patient ID: Carol Collins, female DOB: 07-31-1976 Age: 48 y.o. MRN: [id number]   Carol Collins is a pleasant 48 y.o. female presenting with the following:  Chief Complaint  Patient presents with   Menopause    Night sweats started Friday night 05/02/24. Other symptomsTender breast and gaining weight, bladder weakness, and brain fog.     Vitals:   05/08/24 1351  BP: (!) 128/90  Pulse: 77  SpO2: 99%   Vitals:   05/08/24 1351  Weight: 178 lb (80.7 kg)  Height: 5' 2 (1.575 m)   Body mass index is 32.56 kg/m.  No results found.   Independent interpretation of notes and tests performed by another provider:   None  Procedures performed:   None  Pertinent History, Exam, Impression, and Recommendations:   Problem List Items Addressed This Visit     DM type 2 with diabetic peripheral neuropathy (HCC)   Relevant Medications   PARoxetine Mesylate 7.5 MG CAPS   buPROPion  (WELLBUTRIN  SR) 150 MG 12 hr tablet   traZODone (DESYREL) 50 MG tablet   Perimenopausal vasomotor symptoms   She experiences night sweats that significantly disrupt her sleep, causing her to wake up drenched and uncomfortable, leading to only three to four hours of sleep per night. These are not accompanied by typical hot flashes, but rather a slight increase in warmth that resolves quickly.  She also experiences other menopause-related symptoms such as mastalgia, weight gain, skin changes, changes in body odor, loss of interest, bladder weakness, cognitive difficulties, mood changes, and concentration difficulties.   Vasomotor menopausal symptoms Menopausal symptoms likely due to hormonal changes, she is status post-hysterectomy. HReferral to gynecology for hormone evaluation at management. Paroxetine prescribed for interim vasomotor symptoms. - Refer to gynecology for hormone management. - Prescribe paroxetine 10 mg once daily for  vasomotor symptoms. - Discuss potential side effects of paroxetine, including 4-6 weeks for full effect. - Discuss potential use of estrogen patches post-gynecology consultation.  Sleep disturbance due to night sweats Severe sleep disturbance due to night sweats, part of vasomotor symptoms. Immediate management with medication necessary to improve sleep quality. Trazodone prescribed as temporary measure until paroxetine takes effect. - Prescribe trazodone 50 mg as needed for sleep, starting with half a tablet (25 mg).  Can take a maximum of 2 tablets (100 mg) nightly as needed. - Advise to use trazodone only when necessary, not exceeding two tablets.      Relevant Medications   PARoxetine Mesylate 7.5 MG CAPS   Other Relevant Orders   Ambulatory referral to Obstetrics / Gynecology   Polyarthralgia - Primary   Possible autoimmune condition Possible autoimmune condition suggested by morning stiffness and family history of arthritis. Early detection crucial to prevent joint damage. - Order autoimmune panel to investigate potential autoimmune conditions. - Discuss possibility of referral to rheumatology if autoimmune markers are positive.      Relevant Orders   ANA 12Plus Profile, Do All RDL   Stenosing tenosynovitis of thumb   In addition to menopause symptoms, she experiences joint issues, particularly in her thumb and right PIP aspect of middle finger. She describes morning stiffness and swelling in her middle finger that lasts two to three hours, which she massages to alleviate. She has a history of receiving injections for her thumb pain, which provided relief for about three months.  Stenosing  tenosynovitis of right thumb Acute on chronic right thumb pain  and stiffness. Consideration of alternative treatments due to cost and insurance coverage.  - Recommend over-the-counter Voltaren gel (diclofenac) for topical use up to four times a day. - Discuss potential for occupational therapy  if symptoms persist. - Order autoimmune panel to rule out autoimmune conditions.      Tobacco abuse   Smoking cessation Desire to quit smoking, with previous attempts using bupropion . Smoking cessation important due to potential interactions with hormone therapy and overall health improvement. Bupropion  prescribed to aid cessation. - Prescribe bupropion , starting with one tablet for three days, then increasing to two tablets daily. - Advise to plan a quit date approximately seven days after starting bupropion . - Discuss importance of adherence to medication regimen.      Relevant Medications   buPROPion  (WELLBUTRIN  SR) 150 MG 12 hr tablet   Other Visit Diagnoses       Long-term (current) use of injectable non-insulin antidiabetic drugs            Orders & Medications Medications:  Meds ordered this encounter  Medications   PARoxetine Mesylate 7.5 MG CAPS    Sig: Take 1 capsule by mouth at bedtime.    Dispense:  90 capsule    Refill:  0   buPROPion  (WELLBUTRIN  SR) 150 MG 12 hr tablet    Sig: Take 150 mg PO once daily for 3 days then increase to 150 mg PO twice daily. Plan to stop smoking 7 days after starting medication.    Dispense:  180 tablet    Refill:  0   traZODone (DESYREL) 50 MG tablet    Sig: Take 0.5-2 tablets (25-100 mg total) by mouth at bedtime as needed.    Dispense:  30 tablet    Refill:  2   Orders Placed This Encounter  Procedures   ANA 12Plus Profile, Do All RDL   Ambulatory referral to Obstetrics / Gynecology     No follow-ups on file.     Ma Saupe, MD, [facility]   Primary Care Sports Medicine Primary Care and Sports Medicine at [facility]

## 2024-05-08 NOTE — Assessment & Plan Note (Signed)
 Smoking cessation Desire to quit smoking, with previous attempts using bupropion . Smoking cessation important due to potential interactions with hormone therapy and overall health improvement. Bupropion  prescribed to aid cessation. - Prescribe bupropion , starting with one tablet for three days, then increasing to two tablets daily. - Advise to plan a quit date approximately seven days after starting bupropion . - Discuss importance of adherence to medication regimen.

## 2024-05-16 ENCOUNTER — Other Ambulatory Visit (HOSPITAL_COMMUNITY): Payer: Self-pay

## 2024-05-16 ENCOUNTER — Telehealth: Payer: Self-pay | Admitting: Pharmacy Technician

## 2024-05-16 NOTE — Telephone Encounter (Signed)
 Pharmacy Patient Advocate Encounter  Received notification from [facility] Medicaid that Prior Authorization for PARoxetine  Mesylate 7.5MG  capsules has been DENIED.  Full denial letter will be uploaded to the media tab. See denial reason below.       PA #/Case ID/Reference #: [id number]

## 2024-05-16 NOTE — Telephone Encounter (Signed)
 Pharmacy Patient Advocate Encounter   Received notification from [employer] that prior authorization for PARoxetine  Mesylate 7.5MG  capsules is required/requested.   Insurance verification completed.   The patient is insured through [facility] [place] .   Per test claim: PA required; PA submitted to above mentioned insurance via CoverMyMeds Key/confirmation #/EOC [facility] Status is pending

## 2024-05-18 ENCOUNTER — Ambulatory Visit: Payer: Self-pay | Admitting: Family Medicine

## 2024-05-18 LAB — ANA 12PLUS PROFILE, DO ALL RDL
Anti-CCP Ab, IgG & IgA (RDL): <20 U (ref <20)
Anti-Cardiolipin Ab, IgA (RDL): <12 U/mL (ref <12)
Anti-Cardiolipin Ab, IgG (RDL): <15 GPL U/mL (ref <15)
Anti-Cardiolipin Ab, IgM (RDL): <13 [MPL'U]/mL (ref <13)
Anti-Centromere Ab (RDL): <1:40 {titer}
Anti-Chromatin Ab, IgG (RDL): <20 U (ref <20)
Anti-La (SS-B) Ab (RDL): <20 U (ref <20)
Anti-Nuclear Ab by IFA (RDL): POSITIVE — AB
Anti-Ro (SS-A) Ab (RDL): <20 U (ref <20)
Anti-Scl-70 Ab (RDL): <20 U (ref <20)
Anti-Sm Ab (RDL): <20 U (ref <20)
Anti-TPO Ab (RDL): <9 [IU]/mL (ref <9.0)
Anti-U1 RNP Ab (RDL): <20 U (ref <20)
Anti-dsDNA Ab by Farr(RDL): <8 [IU]/mL (ref <8.0)
C3 Complement (RDL): 160 mg/dL (ref 90–180)
C4 Complement (RDL): 27 mg/dL (ref 10–40)
Rheumatoid Factor by Turb RDL: <14 [IU]/mL (ref <14)

## 2024-05-18 LAB — ANA TITER AND PATTERN: Speckled Pattern: 1:80 {titer} — ABNORMAL HIGH

## 2024-05-27 ENCOUNTER — Encounter: Payer: Self-pay | Admitting: Family Medicine

## 2024-05-27 ENCOUNTER — Other Ambulatory Visit (INDEPENDENT_AMBULATORY_CARE_PROVIDER_SITE_OTHER): Payer: Self-pay | Admitting: Radiology

## 2024-05-27 ENCOUNTER — Ambulatory Visit (INDEPENDENT_AMBULATORY_CARE_PROVIDER_SITE_OTHER): Admitting: Family Medicine

## 2024-05-27 VITALS — BP 128/72 | HR 67 | Ht 62.0 in | Wt 172.0 lb

## 2024-05-27 DIAGNOSIS — M65319 Trigger thumb, unspecified thumb: Secondary | ICD-10-CM

## 2024-05-27 DIAGNOSIS — M65311 Trigger thumb, right thumb: Secondary | ICD-10-CM | POA: Diagnosis not present

## 2024-05-27 NOTE — Patient Instructions (Signed)
 You have just been given a cortisone injection to reduce pain and inflammation. After the injection you may notice immediate relief of pain as a result of the Lidocaine. It is important to rest the area of the injection for 24 to 48 hours after the injection. There is a possibility of some temporary increased discomfort and swelling for up to 72 hours until the cortisone begins to work. If you do have pain, simply rest the joint and use ice. If you can tolerate over the counter medications, you can try Tylenol, Aleve, or Advil  for added relief per package instructions.  Patient Plan for Trigger Finger, Right Thumb  1. Treatment:    - Administer a cortisone injection to the right thumb under ultrasound guidance to reduce inflammation and nodularity.  2. Post-Injection Care:    - Advise wearing a brace for two days to limit thumb movement and support healing.    - Use the brace during activities beyond normal daily tasks to prevent exacerbation.  3. Rehabilitation:    - Once symptoms improve, begin tendon stretching exercises to enhance mobility and prevent recurrence.  Monitoring: - Observe for any changes in symptoms or adverse reactions following the cortisone injection. - If symptoms persist or worsen, consider further evaluation or alternative treatment options.

## 2024-05-27 NOTE — Progress Notes (Signed)
     Primary Care / Sports Medicine Office Visit  Patient Information:  Patient ID: Carol Collins, female DOB: [birth year] Age: 48 y.o. MRN: [id number]   Carol Collins is a pleasant 48 y.o. female presenting with the following:  Chief Complaint  Patient presents with   Thumb pain     Rt thumb pain. Seen in past. Getting worse. Pt tried cortisone shot. Shot lasted for 3-4 months.     Vitals:   05/27/24 1443  BP: 128/72  Pulse: 67  SpO2: 98%   Vitals:   05/27/24 1443  Weight: 172 lb (78 kg)  Height: 5' 2 (1.575 m)   Body mass index is 31.46 kg/m.  No results found.   Independent interpretation of notes and tests performed by another provider:   None  Procedures performed:   Procedure:  Injection of right thumb under ultrasound guidance. Ultrasound guidance utilized for out plane approach to right 1st flexor tendon sheat, thickening noted at [facility] Samsung HS60 device utilized with permanent recording / reporting. Verbal informed consent obtained and verified. Skin prepped in a sterile fashion. Ethyl chloride for topical local analgesia.  Completed without difficulty and tolerated well. Medication: triamcinolone  acetonide 40 mg/mL suspension for injection 0.5 mL total and 0.5 mL lidocaine 1% without epinephrine utilized for needle placement anesthetic Advised to contact for fevers/chills, erythema, induration, drainage, or persistent bleeding.   Pertinent History, Exam, Impression, and Recommendations:   Problem List Items Addressed This Visit     Stenosing tenosynovitis of thumb - Primary   History of Present Illness Carol Collins is a 48 year old female with diabetes who presents with recurrence of chronic right thumb locking and pain.  Right thumb locking and pain - Locking and pain localized to the right first metacarpophalangeal (MCP) joint - Presence of nodularity and surrounding swelling at the affected joint - Symptoms are most severe upon  waking  Prior interventions and supportive measures - Received a cortisone injection in October 2024 with relief lasting approximately ten months - Currently not taking specific medication for the thumb condition - Utilizes a brace for support, which has been modified with a metal cage by her son, but finds it cumbersome  Physical Exam RANGE OF MOTION: Right first MCP joint exhibits nodularity with surrounding swelling. Radial styloid is non-tender. Negative CT testing. No active triggering.  Assessment and Plan Trigger finger, right thumb Chronic trigger finger with nodularity and swelling at the right first MCP joint. Recurrence of inflammation likely exacerbated by h/o diabetes. - Administer cortisone injection to the right thumb under ultrasound guidance. - Advise use of a brace for two days to limit movement. - Provide tendon stretching exercises once symptoms improve. - Instruct to use the brace for activities beyond normal daily tasks.      Relevant Orders   US  LIMITED JOINT SPACE STRUCTURES UP RIGHT     Orders & Medications Medications: No orders of the defined types were placed in this encounter.  Orders Placed This Encounter  Procedures   US  LIMITED JOINT SPACE STRUCTURES UP RIGHT     No follow-ups on file.     [name], MD, [facility]   Primary Care Sports Medicine Primary Care and Sports Medicine at [facility]

## 2024-05-27 NOTE — Assessment & Plan Note (Signed)
 History of Present Illness Carol Collins is a 48 year old female with diabetes who presents with recurrence of chronic right thumb locking and pain.  Right thumb locking and pain - Locking and pain localized to the right first metacarpophalangeal (MCP) joint - Presence of nodularity and surrounding swelling at the affected joint - Symptoms are most severe upon waking  Prior interventions and supportive measures - Received a cortisone injection in October 2024 with relief lasting approximately ten months - Currently not taking specific medication for the thumb condition - Utilizes a brace for support, which has been modified with a metal cage by her son, but finds it cumbersome  Physical Exam RANGE OF MOTION: Right first MCP joint exhibits nodularity with surrounding swelling. Radial styloid is non-tender. Negative CT testing. No active triggering.  Assessment and Plan Trigger finger, right thumb Chronic trigger finger with nodularity and swelling at the right first MCP joint. Recurrence of inflammation likely exacerbated by h/o diabetes. - Administer cortisone injection to the right thumb under ultrasound guidance. - Advise use of a brace for two days to limit movement. - Provide tendon stretching exercises once symptoms improve. - Instruct to use the brace for activities beyond normal daily tasks.

## 2024-05-31 DIAGNOSIS — Z419 Encounter for procedure for purposes other than remedying health state, unspecified: Secondary | ICD-10-CM | POA: Diagnosis not present

## 2024-06-24 ENCOUNTER — Other Ambulatory Visit (HOSPITAL_COMMUNITY): Payer: Self-pay

## 2024-06-26 ENCOUNTER — Other Ambulatory Visit (HOSPITAL_COMMUNITY): Payer: Self-pay

## 2024-07-01 DIAGNOSIS — Z419 Encounter for procedure for purposes other than remedying health state, unspecified: Secondary | ICD-10-CM | POA: Diagnosis not present

## 2024-07-02 ENCOUNTER — Telehealth: Payer: Self-pay | Admitting: Pharmacy Technician

## 2024-07-02 ENCOUNTER — Other Ambulatory Visit (HOSPITAL_COMMUNITY): Payer: Self-pay

## 2024-07-14 NOTE — Telephone Encounter (Signed)
 ERROR

## 2024-08-01 DIAGNOSIS — Z419 Encounter for procedure for purposes other than remedying health state, unspecified: Secondary | ICD-10-CM | POA: Diagnosis not present

## 2024-08-18 ENCOUNTER — Other Ambulatory Visit: Payer: Self-pay | Admitting: Family Medicine

## 2024-08-18 DIAGNOSIS — Z720 Tobacco use: Secondary | ICD-10-CM

## 2024-08-19 NOTE — Telephone Encounter (Signed)
 Requested medications are due for refill today.  unsure  Requested medications are on the active medications list.  yes  Last refill. 05/08/2024 #180 0 rf  Future visit scheduled.   yes  Notes to clinic.  Unsure if pt is still taking this medication. Please review.    Requested Prescriptions  Pending Prescriptions Disp Refills   buPROPion  (WELLBUTRIN  SR) 150 MG 12 hr tablet [Pharmacy Med Name: BUPROPION  HCL SR 150 MG TABLET] 180 tablet 0    Sig: 1 TAB DAILY FOR 3 DAYS. INCREASE TO 1 TAB TWICE DAILY. PLAN TO STOP SMOKING 7 DAYS AFTER STARTING     Psychiatry: Antidepressants - bupropion  Failed - 08/19/2024  5:44 PM      Failed - Valid encounter within last 6 months    Recent Outpatient Visits           2 months ago Stenosing tenosynovitis of thumb   [place] [facility], Selinda PARAS, MD   3 months ago Polyarthralgia   [place] [facility] at [facility], Selinda PARAS, MD   6 months ago Chronic midline low back pain without sciatica   [place] [facility] at [facility], Selinda PARAS, MD       Future Appointments             In 3 weeks Ku Selinda PARAS, MD [facility] Medicine at [facility], [phone] [place]            Passed - Cr in normal range and within 360 days    Creat  Date Value Ref Range Status  02/23/2021 0.85 0.50 - 1.10 mg/dL Final   Creatinine, Ser  Date Value Ref Range Status  02/06/2024 0.90 0.57 - 1.00 mg/dL Final         Passed - AST in normal range and within 360 days    AST  Date Value Ref Range Status  02/06/2024 15 0 - 40 IU/L Final         Passed - ALT in normal range and within 360 days    ALT  Date Value Ref Range Status  02/06/2024 8 0 - 32 IU/L Final         Passed - Last BP in normal range    BP Readings from Last 1 Encounters:  05/27/24 128/72

## 2024-08-20 ENCOUNTER — Telehealth: Payer: Self-pay

## 2024-08-20 NOTE — Telephone Encounter (Signed)
 Spoke with patient and asked about diabetic eye exam. She said she has not had one this year as medicaid only covers every 2 years. She does not remember where she had exam at, but she will have info when she comes to her appt on 09/11/24.   JM

## 2024-08-31 DIAGNOSIS — Z419 Encounter for procedure for purposes other than remedying health state, unspecified: Secondary | ICD-10-CM | POA: Diagnosis not present

## 2024-09-11 ENCOUNTER — Encounter: Payer: Self-pay | Admitting: Family Medicine

## 2024-09-11 ENCOUNTER — Ambulatory Visit (INDEPENDENT_AMBULATORY_CARE_PROVIDER_SITE_OTHER): Admitting: Family Medicine

## 2024-09-11 VITALS — BP 110/75 | HR 74 | Temp 97.6°F | Ht 62.0 in | Wt 172.8 lb

## 2024-09-11 DIAGNOSIS — N951 Menopausal and female climacteric states: Secondary | ICD-10-CM | POA: Diagnosis not present

## 2024-09-11 DIAGNOSIS — D509 Iron deficiency anemia, unspecified: Secondary | ICD-10-CM | POA: Diagnosis not present

## 2024-09-11 DIAGNOSIS — I10 Essential (primary) hypertension: Secondary | ICD-10-CM | POA: Diagnosis not present

## 2024-09-11 DIAGNOSIS — Z720 Tobacco use: Secondary | ICD-10-CM

## 2024-09-11 DIAGNOSIS — Z1211 Encounter for screening for malignant neoplasm of colon: Secondary | ICD-10-CM | POA: Diagnosis not present

## 2024-09-11 DIAGNOSIS — Z0000 Encounter for general adult medical examination without abnormal findings: Secondary | ICD-10-CM

## 2024-09-11 DIAGNOSIS — G4710 Hypersomnia, unspecified: Secondary | ICD-10-CM | POA: Diagnosis not present

## 2024-09-11 DIAGNOSIS — Z7985 Long-term (current) use of injectable non-insulin antidiabetic drugs: Secondary | ICD-10-CM

## 2024-09-11 DIAGNOSIS — Z1231 Encounter for screening mammogram for malignant neoplasm of breast: Secondary | ICD-10-CM

## 2024-09-11 DIAGNOSIS — E1142 Type 2 diabetes mellitus with diabetic polyneuropathy: Secondary | ICD-10-CM

## 2024-09-11 MED ORDER — LISINOPRIL 5 MG PO TABS: 5.0000 mg | ORAL_TABLET | Freq: Every day | ORAL | 1 refills | Status: AC

## 2024-09-11 MED ORDER — PAROXETINE HCL 10 MG PO TABS: 10.0000 mg | ORAL_TABLET | Freq: Every day | ORAL | 0 refills | Status: AC

## 2024-09-11 MED ORDER — OZEMPIC (1 MG/DOSE) 4 MG/3ML ~~LOC~~ SOPN: PEN_INJECTOR | SUBCUTANEOUS | 3 refills | Status: AC

## 2024-09-12 ENCOUNTER — Encounter: Payer: Self-pay | Admitting: Family Medicine

## 2024-09-12 DIAGNOSIS — Z7985 Long-term (current) use of injectable non-insulin antidiabetic drugs: Secondary | ICD-10-CM | POA: Insufficient documentation

## 2024-09-12 DIAGNOSIS — Z1231 Encounter for screening mammogram for malignant neoplasm of breast: Secondary | ICD-10-CM | POA: Insufficient documentation

## 2024-09-12 NOTE — Progress Notes (Signed)
 Annual Physical Exam Visit  Patient Information:  Patient ID: Carol Collins, female DOB: [birth year] Age: 48 y.o. MRN: [id number]   Subjective:   CC: Annual Physical Exam  HPI:  Carol Collins is here for their annual physical.  I reviewed the past medical history, family history, social history, surgical history, and allergies today and changes were made as necessary.  Please see the problem list section below for additional details.  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      Media Information    Document Information             Photos      Left medial foot      07/03/2023 16:42      Attached To:      Office Visit on 07/03/23 with Alvia Selinda PARAS, MD     Media Information    Document Information             Photos      Left medial foot      07/03/2023 16:42      Attached To:      Office Visit on 07/03/23 with Alvia Selinda PARAS, MD         Past Surgical History: Past Surgical History:  Procedure Laterality Date   ABDOMINAL HYSTERECTOMY     total including cervix removal   CESAREAN SECTION     DIAGNOSTIC LAPAROSCOPY     laproscopy     MYRINGOTOMY WITH TUBE PLACEMENT Bilateral 08/31/2016   Procedure: MYRINGOTOMY WITH TUBE PLACEMENT;  Surgeon: Carolee Hunter, MD;  Location: [facility] ORS;  Service: ENT;  Laterality: Bilateral;   Family History: Family History  Problem Relation Age of Onset   Breast cancer Mother 57   Diabetes Mother    Heart disease Father    Diabetes Father    Hyperlipidemia Father    Hyperlipidemia Sister    Breast cancer Maternal Grandmother 30   Diabetes Maternal Grandfather    Allergies: Allergies  Allergen Reactions   Codeine Other (See Comments)    hyperactivity   Latex Hives and Rash   Health Maintenance: Health Maintenance  Topic Date Due    Colonoscopy  Never done   HEMOGLOBIN A1C  08/08/2024   Influenza Vaccine  02/17/2025 (Originally 06/20/2024)   Pneumococcal Vaccine (1 of 2 - PCV) 09/11/2025 (Originally 01/26/1994)   Hepatitis B Vaccines 19-59 Average Risk (1 of 3 - 19+ 3-dose series) 09/11/2025 (Originally 01/26/1994)   FOOT EXAM  09/18/2024   OPHTHALMOLOGY EXAM  12/09/2024   Diabetic kidney evaluation - eGFR measurement  02/05/2025   Diabetic kidney evaluation - Urine ACR  02/05/2025   Mammogram  02/06/2026   DTaP/Tdap/Td (2 - Td or Tdap) 09/18/2033   Hepatitis C Screening  Completed   HIV Screening  Completed   HPV VACCINES  Aged Out   Meningococcal B Vaccine  Aged Out   COVID-19 Vaccine  Discontinued    HM Colonoscopy          Current Care Gaps     Colonoscopy (Every 10 Years) Never done   No completion history exists for this topic.                Medications: Current Outpatient Medications on  File Prior to Visit  Medication Sig Dispense Refill   buPROPion  (WELLBUTRIN  SR) 150 MG 12 hr tablet 1 TAB DAILY FOR 3 DAYS. INCREASE TO 1 TAB TWICE DAILY. PLAN TO STOP SMOKING 7 DAYS AFTER STARTING 180 tablet 0   No current facility-administered medications on file prior to visit.    Discussed the use of AI scribe software for clinical note transcription with the patient, who gave verbal consent to proceed.   Objective:   Vitals:   09/11/24 0823 09/11/24 0907  BP: 90/60 110/75  Pulse: 74   Temp: 97.6 F (36.4 C)    Vitals:   09/11/24 0823  Weight: 172 lb 12.8 oz (78.4 kg)  Height: 5' 2 (1.575 m)   Body mass index is 31.61 kg/m.  General: Well Developed, well nourished, and in no acute distress.  Neuro: Alert and oriented x3, extra-ocular muscles intact, sensation grossly intact. Cranial nerves II through XII are grossly intact, motor, sensory, and coordinative functions are intact. HEENT: Normocephalic, atraumatic, neck supple, no masses, no lymphadenopathy, thyroid  nonenlarged. Oropharynx,  nasopharynx, external ear canals are unremarkable. Skin: Warm and dry, no rashes noted.  Cardiac: Regular rate and rhythm, no murmurs rubs or gallops. No peripheral edema. Pulses symmetric. Respiratory: Clear to auscultation bilaterally. Speaking in full sentences.  Abdominal: Soft, nontender, nondistended, positive bowel sounds, no masses, no organomegaly. Musculoskeletal: Stable, and with full range of motion.  Impression and Recommendations:   The patient was counselled, risk factors were discussed, and anticipatory guidance given.  Problem List Items Addressed This Visit     Breast cancer screening by mammogram   Screening for breast cancer Recent mammogram normal, next due March 2026. - Schedule screening mammogram for March 2026.      Colon cancer screening   Relevant Orders   Ambulatory referral to Gastroenterology   DM type 2 with diabetic peripheral neuropathy (HCC)   Type 2 diabetes mellitus - Continue current diabetes management plan. - Schedule follow-up in 6 months.      Relevant Medications   PARoxetine  (PAXIL ) 10 MG tablet   lisinopril  (ZESTRIL ) 5 MG tablet   Semaglutide , 1 MG/DOSE, (OZEMPIC , 1 MG/DOSE,) 4 MG/3ML SOPN   Essential hypertension   Blood pressure variability - Recent blood pressure readings: 128/90 in June, 128/72 in July, and 90/60 most recently - No associated symptoms such as lightheadedness or dizziness  Hypertension Blood pressure trending down, lisinopril  dose may be too high. - Decrease lisinopril  dose from 10 mg to 5 mg. - Monitor blood pressure for hypotension symptoms.      Relevant Medications   lisinopril  (ZESTRIL ) 5 MG tablet   Excessive sleepiness   Sleep disturbance - Ongoing sleep disturbances with approximately four hours of sleep per night - Sleep disruption primarily due to night sweats related to menopause - No suitable location found for a sleep study - Believes sleep issues are related to menopause rather than a  primary sleep disorder - No use of trazodone  for sleep due to insurance coverage  Menopausal symptoms Symptoms attributed to menopause. - Prescribed paroxetine  10 mg once daily at bedtime for night sweats. - Referred to gynecologist for further management.      Healthcare maintenance - Primary   Relevant Orders   CBC   Comprehensive metabolic panel with GFR   Hemoglobin A1c   Lipid panel   Iron deficiency anemia   Relevant Orders   Iron, TIBC and Ferritin Panel   Long-term (current) use of injectable non-insulin antidiabetic drugs  Relevant Orders   TSH   Perimenopausal vasomotor symptoms   Menopausal symptoms Symptoms attributed to menopause. - Prescribed paroxetine  10 mg once daily at bedtime for night sweats. - Referred to gynecologist for further management.      Relevant Medications   PARoxetine  (PAXIL ) 10 MG tablet   lisinopril  (ZESTRIL ) 5 MG tablet   Other Relevant Orders   Ambulatory referral to Obstetrics / Gynecology   Tobacco abuse   Nicotine dependence in remission Successfully quit smoking with Wellbutrin , no cravings reported. - Continue Wellbutrin  for 11-month course. - Monitor for cravings post-medication.        Orders & Medications Medications:  Meds ordered this encounter  Medications   PARoxetine  (PAXIL ) 10 MG tablet    Sig: Take 1 tablet (10 mg total) by mouth at bedtime.    Dispense:  90 tablet    Refill:  0   lisinopril  (ZESTRIL ) 5 MG tablet    Sig: Take 1 tablet (5 mg total) by mouth daily.    Dispense:  90 tablet    Refill:  1   Semaglutide , 1 MG/DOSE, (OZEMPIC , 1 MG/DOSE,) 4 MG/3ML SOPN    Sig: INJECT 1 MG ONCE A WEEK AS DIRECTED    Dispense:  3 mL    Refill:  3   Orders Placed This Encounter  Procedures   CBC   Comprehensive metabolic panel with GFR   Hemoglobin A1c   Lipid panel   Iron, TIBC and Ferritin Panel   TSH   Ambulatory referral to Obstetrics / Gynecology   Ambulatory referral to Gastroenterology     Return in  about 6 months (around 03/12/2025) for HTN.    [name], MD, [facility]   Primary Care Sports Medicine Primary Care and Sports Medicine at [facility]

## 2024-09-12 NOTE — Assessment & Plan Note (Signed)
 Screening for breast cancer Recent mammogram normal, next due March 2026. - Schedule screening mammogram for March 2026.

## 2024-09-12 NOTE — Patient Instructions (Addendum)
-   Obtain fasting labs with orders provided (can have water or black coffee but otherwise no food or drink x 8 hours before labs) - Review information provided - Attend eye doctor annually, dentist every 6 months, work towards or maintain 30 minutes of moderate intensity physical activity at least 5 days per week, and consume a balanced diet - Return in 1 year for physical - Contact us  for any questions between now and then   VISIT SUMMARY:  You had your annual physical exam today. We discussed your blood pressure, sleep disturbances, menopausal symptoms, tobacco cessation, and the need for colorectal cancer screening.  YOUR PLAN:  MENOPAUSAL SYMPTOMS: You are experiencing night sweats and brain fog due to menopause. -Start taking paroxetine  10 mg once daily at bedtime for night sweats. -You have been referred to a gynecologist for further management.  HYPERTENSION: Your blood pressure has been trending down, and your current medication dose may be too high. -Decrease your lisinopril  dose from 10 mg to 5 mg. -Monitor your blood pressure for any symptoms of low blood pressure, such as dizziness or lightheadedness.  TYPE 2 DIABETES MELLITUS: Continue with your current diabetes management plan. -Schedule a follow-up appointment in 6 months.  NICOTINE DEPENDENCE IN REMISSION: You have successfully quit smoking with the help of Wellbutrin  and are not experiencing cravings. -Continue taking Wellbutrin  for the 34-month course. -Monitor for any cravings after you finish the medication.  SCREENING FOR COLORECTAL CANCER: You need to complete your colorectal cancer screening and prefer a colonoscopy. -You have been referred for a colonoscopy. -Discuss bowel preparation options with gastroenterology.  SCREENING FOR BREAST CANCER: Your recent mammogram was normal. -Schedule your next screening mammogram for March 2026.  GENERAL ADULT MEDICAL EXAMINATION: Routine examination conducted with no new  allergy issues and resolved low back pain. -No new actions required at this time.

## 2024-09-12 NOTE — Assessment & Plan Note (Signed)
 Nicotine dependence in remission Successfully quit smoking with Wellbutrin , no cravings reported. - Continue Wellbutrin  for 74-month course. - Monitor for cravings post-medication.

## 2024-09-12 NOTE — Assessment & Plan Note (Signed)
 Menopausal symptoms Symptoms attributed to menopause. - Prescribed paroxetine  10 mg once daily at bedtime for night sweats. - Referred to gynecologist for further management.

## 2024-09-12 NOTE — Assessment & Plan Note (Signed)
 Sleep disturbance - Ongoing sleep disturbances with approximately four hours of sleep per night - Sleep disruption primarily due to night sweats related to menopause - No suitable location found for a sleep study - Believes sleep issues are related to menopause rather than a primary sleep disorder - No use of trazodone  for sleep due to insurance coverage  Menopausal symptoms Symptoms attributed to menopause. - Prescribed paroxetine  10 mg once daily at bedtime for night sweats. - Referred to gynecologist for further management.

## 2024-09-12 NOTE — Assessment & Plan Note (Signed)
 Type 2 diabetes mellitus - Continue current diabetes management plan. - Schedule follow-up in 6 months.

## 2024-09-12 NOTE — Assessment & Plan Note (Signed)
 Blood pressure variability - Recent blood pressure readings: 128/90 in June, 128/72 in July, and 90/60 most recently - No associated symptoms such as lightheadedness or dizziness  Hypertension Blood pressure trending down, lisinopril  dose may be too high. - Decrease lisinopril  dose from 10 mg to 5 mg. - Monitor blood pressure for hypotension symptoms.

## 2024-09-17 ENCOUNTER — Other Ambulatory Visit: Payer: Self-pay

## 2024-09-17 ENCOUNTER — Telehealth: Payer: Self-pay

## 2024-09-17 DIAGNOSIS — Z1211 Encounter for screening for malignant neoplasm of colon: Secondary | ICD-10-CM

## 2024-09-17 MED ORDER — NA SULFATE-K SULFATE-MG SULF 17.5-3.13-1.6 GM/177ML PO SOLN: 1.0000 | Freq: Once | ORAL | 0 refills | Status: AC

## 2024-09-17 NOTE — Telephone Encounter (Signed)
-----   Message from [facility] sent at 09/17/2024 11:04 AM EDT ----- Regarding: Call pt back Call back between 4:30pm-5pm today.

## 2024-09-17 NOTE — Telephone Encounter (Signed)
 Gastroenterology Pre-Procedure Review  Request Date: 12/10/24 Requesting Physician: Dr. melany  PATIENT REVIEW QUESTIONS: The patient responded to the following health history questions as indicated:    1. Are you having any GI issues? no 2. Do you have a personal history of Polyps? no 3. Do you have a family history of Colon Cancer or Polyps? no 4. Diabetes Mellitus? yes (takes ozempic  has been advised to stop 7 days prior) 5. Joint replacements in the past 12 months?no 6. Major health problems in the past 3 months?no 7. Any artificial heart valves, MVP, or defibrillator?no    MEDICATIONS & ALLERGIES:    Patient reports the following regarding taking any anticoagulation/antiplatelet therapy:   Plavix, Coumadin, Eliquis, Xarelto, Lovenox, Pradaxa, Brilinta, or Effient? no Aspirin? no  Patient confirms/reports the following medications:  Current Outpatient Medications  Medication Sig Dispense Refill   buPROPion  (WELLBUTRIN  SR) 150 MG 12 hr tablet 1 TAB DAILY FOR 3 DAYS. INCREASE TO 1 TAB TWICE DAILY. PLAN TO STOP SMOKING 7 DAYS AFTER STARTING 180 tablet 0   lisinopril  (ZESTRIL ) 5 MG tablet Take 1 tablet (5 mg total) by mouth daily. 90 tablet 1   PARoxetine  (PAXIL ) 10 MG tablet Take 1 tablet (10 mg total) by mouth at bedtime. 90 tablet 0   Semaglutide , 1 MG/DOSE, (OZEMPIC , 1 MG/DOSE,) 4 MG/3ML SOPN INJECT 1 MG ONCE A WEEK AS DIRECTED 3 mL 3   No current facility-administered medications for this visit.    Patient confirms/reports the following allergies:  Allergies  Allergen Reactions   Codeine Other (See Comments)    hyperactivity   Latex Hives and Rash    No orders of the defined types were placed in this encounter.   AUTHORIZATION INFORMATION Primary Insurance: 1D#: Group #:  Secondary Insurance: 1D#: Group #:  SCHEDULE INFORMATION: Date: 12/10/24 Time: Location: [facility]

## 2024-11-10 ENCOUNTER — Encounter: Payer: Self-pay | Admitting: Family Medicine

## 2024-11-10 ENCOUNTER — Ambulatory Visit: Admitting: Family Medicine

## 2024-11-11 ENCOUNTER — Ambulatory Visit: Admitting: Family Medicine

## 2024-11-12 ENCOUNTER — Ambulatory Visit: Admitting: Family Medicine

## 2024-11-12 ENCOUNTER — Encounter: Payer: Self-pay | Admitting: Family Medicine

## 2024-11-12 VITALS — BP 128/70 | HR 70 | Ht 62.0 in | Wt 177.8 lb

## 2024-11-12 DIAGNOSIS — M65311 Trigger thumb, right thumb: Secondary | ICD-10-CM

## 2024-11-12 DIAGNOSIS — M65319 Trigger thumb, unspecified thumb: Secondary | ICD-10-CM

## 2024-11-12 MED ORDER — DICLOFENAC SODIUM 50 MG PO TBEC: 50.0000 mg | DELAYED_RELEASE_TABLET | Freq: Two times a day (BID) | ORAL | 0 refills | Status: AC | PRN

## 2024-11-12 MED ORDER — METHYLPREDNISOLONE ACETATE 40 MG/ML IJ SUSP
20.0000 mg | Freq: Once | INTRAMUSCULAR | Status: AC
  Administered 2024-11-12: 20 mg via INTRA_ARTICULAR

## 2024-11-12 NOTE — Patient Instructions (Signed)
 VISIT SUMMARY:  You visited us  today for recurrent pain and mechanical symptoms in your right thumb, which have been diagnosed as stenosing tenosynovitis. We administered a corticosteroid injection to help relieve your symptoms.  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 -We administered a corticosteroid injection to your right thumb today. -Please use a thumb brace for two days to prevent overuse. -Avoid strenuous activities for two days. -Perform gentle range of motion exercises to help distribute the medication and maintain tendon mobility. -You have a prescription for a strong anti-inflammatory medication. Use it sparingly and monitor your kidney function as instructed. -If you experience severe pain, we can discuss an opioid prescription, provided you have a driver. -Contact our office if you do not see improvement or if you experience any side effects from the medication.

## 2024-11-12 NOTE — Addendum Note (Signed)
 Addended by: Nereyda Bowler J on: 11/12/2024 11:09 PM   Modules accepted: Orders

## 2024-11-12 NOTE — Progress Notes (Signed)
 "    Primary Care / Sports Medicine Office Visit  Patient Information:  Patient ID: Carol Collins, female DOB: 21-Jan-1976 Age: 48 y.o. MRN: [id number]   Carol Collins is a pleasant 49 y.o. female presenting with the following:  Chief Complaint  Patient presents with   thumb pain    Right thumb pain.    Vitals:   11/12/24 0801  BP: 128/70  Pulse: 70  SpO2: 99%   Vitals:   11/12/24 0801  Weight: 177 lb 12.8 oz (80.6 kg)  Height: 5' 2 (1.575 m)   Body mass index is 32.52 kg/m.  No results found.   Discussed the use of AI scribe software for clinical note transcription with the patient, who gave verbal consent to proceed.   Independent interpretation of notes and tests performed by another provider:   None  Procedures performed:   Procedure:  Injection of right thumb under ultrasound guidance. Ultrasound guidance utilized for out plane approach to right 1st flexor tendon sheat, thickening noted at [facility] Samsung HS60 device utilized with permanent recording / reporting. Verbal informed consent obtained and verified. Skin prepped in a sterile fashion. Ethyl chloride for topical local analgesia.  Completed without difficulty and tolerated well. Medication: triamcinolone  acetonide 20 mg/mL suspension for injection 0.5 mL total and 0.5 mL lidocaine 1% without epinephrine utilized for needle placement anesthetic Advised to contact for fevers/chills, erythema, induration, drainage, or persistent bleeding.  Pertinent History, Exam, Impression, and Recommendations:   Problem List Items Addressed This Visit     Stenosing tenosynovitis of thumb - Primary   History of Present Illness Carol Collins is a 48 year old female with type 2 diabetes mellitus who presents with recurrent right trigger thumb symptoms.  Right thumb pain and mechanical symptoms - Recurrent pain and mechanical symptoms in the right thumb over the past several weeks - Localized discomfort and sensation  of crepitus at the base of the thumb - Symptoms exacerbated by activity and specific movements - No new trauma  Prior treatment and response - Received corticosteroid injection to the right thumb in July, resulting in significant symptom relief for approximately four months - Since recurrence, has utilized massage, intermittent use of a thumb brace, and stretching exercises - Believes these interventions contributed to the duration of benefit from the prior injection  Type 2 diabetes mellitus - Type 2 diabetes mellitus is well controlled - Previously tolerated anti-inflammatory medications without adverse effects  Physical Exam PALPATION: tenderness present along volar MCP 1st digit, flexor tendon  Results Labs Renal function panel: Within normal limits  Assessment and Plan Stenosing tenosynovitis of the thumb Recurrent, chronic stenosing tenosynovitis of the thumb, previously responsive to corticosteroid injection. Management aimed at reducing injection frequency through exercises. Corticosteroid injection provides relief but risks transient pain and numbness. Anti-inflammatory use limited by diabetes and renal monitoring needs. - Administered corticosteroid injection to the affected thumb. - Advised use of thumb brace for two days post-injection to prevent overuse. - Instructed to avoid strenuous activities for two days post-injection. - Encouraged gentle range of motion exercises to promote medication distribution and tendon mobility. - Sent prescription for strong anti-inflammatory agent with instructions for sparing use and renal function monitoring. - Discussed conditional plan for opioid prescription for severe pain if she has a driver. - Advised to contact office for lack of improvement or medication side effects.      Relevant Medications   diclofenac  (VOLTAREN ) 50 MG EC tablet   Other Relevant Orders  US  LIMITED JOINT SPACE STRUCTURES UP RIGHT     Orders &  Medications Medications:  Meds ordered this encounter  Medications   methylPREDNISolone  acetate (DEPO-MEDROL ) injection 20 mg   diclofenac  (VOLTAREN ) 50 MG EC tablet    Sig: Take 1 tablet (50 mg total) by mouth 2 (two) times daily as needed.    Dispense:  60 tablet    Refill:  0   Orders Placed This Encounter  Procedures   US  LIMITED JOINT SPACE STRUCTURES UP RIGHT     No follow-ups on file.     [name], MD, [facility]   Primary Care Sports Medicine Primary Care and Sports Medicine at [facility]   "

## 2024-11-12 NOTE — Assessment & Plan Note (Signed)
 History of Present Illness Carol Collins is a 48 year old female with type 2 diabetes mellitus who presents with recurrent right trigger thumb symptoms.  Right thumb pain and mechanical symptoms - Recurrent pain and mechanical symptoms in the right thumb over the past several weeks - Localized discomfort and sensation of crepitus at the base of the thumb - Symptoms exacerbated by activity and specific movements - No new trauma  Prior treatment and response - Received corticosteroid injection to the right thumb in July, resulting in significant symptom relief for approximately four months - Since recurrence, has utilized massage, intermittent use of a thumb brace, and stretching exercises - Believes these interventions contributed to the duration of benefit from the prior injection  Type 2 diabetes mellitus - Type 2 diabetes mellitus is well controlled - Previously tolerated anti-inflammatory medications without adverse effects  Physical Exam PALPATION: tenderness present along volar MCP 1st digit, flexor tendon  Results Labs Renal function panel: Within normal limits  Assessment and Plan Stenosing tenosynovitis of the thumb Recurrent, chronic stenosing tenosynovitis of the thumb, previously responsive to corticosteroid injection. Management aimed at reducing injection frequency through exercises. Corticosteroid injection provides relief but risks transient pain and numbness. Anti-inflammatory use limited by diabetes and renal monitoring needs. - Administered corticosteroid injection to the affected thumb. - Advised use of thumb brace for two days post-injection to prevent overuse. - Instructed to avoid strenuous activities for two days post-injection. - Encouraged gentle range of motion exercises to promote medication distribution and tendon mobility. - Sent prescription for strong anti-inflammatory agent with instructions for sparing use and renal function monitoring. - Discussed  conditional plan for opioid prescription for severe pain if she has a driver. - Advised to contact office for lack of improvement or medication side effects.

## 2024-12-05 ENCOUNTER — Telehealth: Payer: Self-pay

## 2024-12-05 NOTE — Telephone Encounter (Signed)
 Received secure staff message from [place], [place] at [facility] stating patient said she lvm requesting to cancel her colonoscopy.  Pt was contacted to confirm.  Pt confirmed to cancel no reschedule requested.  I've asked Erminio to tell Luke to cancel procedure.  Thanks,  [place], CMA

## 2024-12-10 ENCOUNTER — Ambulatory Visit: Admission: RE | Admit: 2024-12-10 | Admitting: Gastroenterology

## 2024-12-10 ENCOUNTER — Encounter: Admission: RE | Payer: Self-pay | Source: Home / Self Care

## 2024-12-10 SURGERY — COLONOSCOPY
Anesthesia: Choice

## 2025-03-12 ENCOUNTER — Ambulatory Visit: Admitting: Family Medicine
